# Patient Record
Sex: Female | Born: 1955 | Race: White | Hispanic: No | Marital: Married | Smoking: Former smoker
Health system: Southern US, Community
[De-identification: ages and names within clinical notes are randomized; demographics above are authoritative.]

## PROBLEM LIST (undated history)

## (undated) DIAGNOSIS — C801 Malignant (primary) neoplasm, unspecified: Secondary | ICD-10-CM

## (undated) DIAGNOSIS — E785 Hyperlipidemia, unspecified: Secondary | ICD-10-CM

## (undated) DIAGNOSIS — T464X5A Adverse effect of angiotensin-converting-enzyme inhibitors, initial encounter: Secondary | ICD-10-CM

## (undated) DIAGNOSIS — I340 Nonrheumatic mitral (valve) insufficiency: Secondary | ICD-10-CM

## (undated) DIAGNOSIS — Z889 Allergy status to unspecified drugs, medicaments and biological substances status: Secondary | ICD-10-CM

## (undated) DIAGNOSIS — Z720 Tobacco use: Secondary | ICD-10-CM

## (undated) DIAGNOSIS — R05 Cough: Secondary | ICD-10-CM

## (undated) DIAGNOSIS — I10 Essential (primary) hypertension: Secondary | ICD-10-CM

## (undated) DIAGNOSIS — R058 Other specified cough: Secondary | ICD-10-CM

## (undated) DIAGNOSIS — Z9289 Personal history of other medical treatment: Secondary | ICD-10-CM

## (undated) DIAGNOSIS — IMO0002 Reserved for concepts with insufficient information to code with codable children: Secondary | ICD-10-CM

## (undated) DIAGNOSIS — I2510 Atherosclerotic heart disease of native coronary artery without angina pectoris: Secondary | ICD-10-CM

## (undated) HISTORY — DX: Cough: R05

## (undated) HISTORY — DX: Personal history of other medical treatment: Z92.89

## (undated) HISTORY — DX: Allergy status to unspecified drugs, medicaments and biological substances: Z88.9

## (undated) HISTORY — DX: Nonrheumatic mitral (valve) insufficiency: I34.0

## (undated) HISTORY — DX: Adverse effect of angiotensin-converting-enzyme inhibitors, initial encounter: T46.4X5A

## (undated) HISTORY — PX: EYE SURGERY: SHX253

## (undated) HISTORY — DX: Tobacco use: Z72.0

## (undated) HISTORY — DX: Atherosclerotic heart disease of native coronary artery without angina pectoris: I25.10

## (undated) HISTORY — DX: Essential (primary) hypertension: I10

## (undated) HISTORY — DX: Hyperlipidemia, unspecified: E78.5

## (undated) HISTORY — DX: Other specified cough: R05.8

---

## 2006-10-14 ENCOUNTER — Ambulatory Visit: Payer: Self-pay | Admitting: Internal Medicine

## 2006-10-14 ENCOUNTER — Ambulatory Visit: Payer: Self-pay | Admitting: Cardiology

## 2006-10-14 ENCOUNTER — Inpatient Hospital Stay (HOSPITAL_COMMUNITY): Admission: EM | Admit: 2006-10-14 | Discharge: 2006-10-17 | Payer: Self-pay | Admitting: Internal Medicine

## 2006-10-15 ENCOUNTER — Encounter: Payer: Self-pay | Admitting: Cardiology

## 2006-11-03 ENCOUNTER — Ambulatory Visit: Payer: Self-pay | Admitting: Physician Assistant

## 2008-10-20 DIAGNOSIS — Z9289 Personal history of other medical treatment: Secondary | ICD-10-CM

## 2008-10-20 HISTORY — DX: Personal history of other medical treatment: Z92.89

## 2008-11-07 ENCOUNTER — Encounter: Payer: Self-pay | Admitting: Cardiology

## 2008-11-07 ENCOUNTER — Ambulatory Visit: Payer: Self-pay | Admitting: Cardiology

## 2008-11-09 ENCOUNTER — Encounter: Payer: Self-pay | Admitting: Cardiology

## 2008-11-28 ENCOUNTER — Encounter: Payer: Self-pay | Admitting: Cardiology

## 2008-11-28 ENCOUNTER — Ambulatory Visit: Payer: Self-pay | Admitting: Cardiology

## 2008-11-28 DIAGNOSIS — I10 Essential (primary) hypertension: Secondary | ICD-10-CM | POA: Insufficient documentation

## 2008-11-28 DIAGNOSIS — I2510 Atherosclerotic heart disease of native coronary artery without angina pectoris: Secondary | ICD-10-CM | POA: Insufficient documentation

## 2008-11-28 DIAGNOSIS — F17200 Nicotine dependence, unspecified, uncomplicated: Secondary | ICD-10-CM | POA: Insufficient documentation

## 2008-11-28 DIAGNOSIS — E785 Hyperlipidemia, unspecified: Secondary | ICD-10-CM

## 2008-11-28 DIAGNOSIS — I080 Rheumatic disorders of both mitral and aortic valves: Secondary | ICD-10-CM

## 2008-11-28 DIAGNOSIS — E119 Type 2 diabetes mellitus without complications: Secondary | ICD-10-CM | POA: Insufficient documentation

## 2010-04-22 HISTORY — PX: BILATERAL OOPHORECTOMY: SHX1221

## 2010-09-04 NOTE — Cardiovascular Report (Signed)
NAMEMarland Kitchen  [name], [name]              ACCOUNT NO.:  [id number]   MEDICAL RECORD NO.:  [id number]          PATIENT TYPE:  INP   LOCATION:  3705                         FACILITY:  [facility]   PHYSICIAN:  Veverly Fells. Excell Seltzer, MD  DATE OF BIRTH:  1955-07-14   DATE OF PROCEDURE:  10/15/2006  DATE OF DISCHARGE:                            CARDIAC CATHETERIZATION   PROCEDURE:  Right heart catheterization, left heart catheterization,  selective coronary angiography, left ventricular angiography.   INDICATIONS:  [name] is 55 year old woman who presented to  [facility] with chest pain and shortness of breath.  She was  found to have pathologic Q waves in her inferior leads and significant  mitral regurgitation murmur.  She was subsequently referred for right  and left cardiac catheterization to determine her coronary anatomy and  status of her hemodynamics in the setting of her suspected mitral valve  regurgitation.   Risks and indications of procedure were explained to the patient.  Informed consent was obtained using modified Seldinger technique. A 6-  French sheaths placed in the right femoral artery and a 7-French sheath  was placed in the right femoral vein.  Standard catheters were used to  image the left and right coronary arteries.  An angled pigtail catheter  was inserted into the left ventricle and pressures were recorded.  The  Swan-Ganz catheter was used to record pressures throughout the right  heart chambers from the right atrium to the pulmonary capillary wedge  position.  Oxygen saturations were drawn from a pulmonary artery and  aorta.  Cardiac output was calculated via the Fick method.  An RAO and  LAO ventriculogram was performed.  A pullback across the aortic valve  was done.  All catheter exchanges were performed over a guidewire.  There are no immediate complications.   FINDINGS:  Right atrial pressure 6, right ventricular pressure 33/6,  pulmonary artery pressure  32/14 with mean 22.  Pulmonary capillary wedge  pressures 8, left ventricular pressure is 92/8, aortic pressure is 94/50  with a mean of 69.   Oxygen saturation pulmonary artery 69, aorta is 95.   Fick cardiac outputs 4.1 liters per minute.  Cardiac index is 2.2 liters  per minute per meter squared.   CORONARY ANGIOGRAPHY:  The left mainstem is very short. There are nearly  separate ostia for the LAD and left circumflex.  The LAD is large-  caliber vessel in is proximal portion.  There is an ostial 50% stenosis.  With the 6-French catheter engaged selectively in the LAD there is mild  pressure damping.  The proximal LAD is calcified.  At the origin of the  first diagonal branch there is a 40-50% stenosis in the LAD with  calcification.  The diagonal branch has 70% ostial stenosis.  Further  down in the mid-LAD there is another fifth 40-50% stenosis.  The LAD  courses down and supplies the left ventricular apex.  The septal  perforators of the LAD supply collateral flow to a chronically occluded  right coronary artery that is well collateralized.   The left circumflex is of medium caliber.  There is a medium-size first  OM branch that has a 80% focal stenosis in the bend of the vessel.  The  vessel was small and appears to be no bigger than 2 mm.  There is a  second OM branch that has some nonobstructive plaque.  The mid  circumflex beyond the second OM branch has diffuse 60% stenosis.  There  is a third OM branch and then the AV groove circumflex beyond the third  OM has another 70% stenosis.  This leads into a left posterolateral  branch.   The right coronary artery is subtotally occluded in the proximal portion  and completely occluded in the midportion.  There is TIMI I flow  antegrade in the right coronary artery.   Left ventricular function assessed by 30 degrees RAO and 45 degrees LAO  ventriculography shows akinetic basal to mid inferior wall and otherwise  normal left  ventricular segments with an preserved LVEF of 50%.  There  appears to be 2+ mitral regurgitation present.   ASSESSMENT:  1. Three-vessel coronary artery disease with moderate disease in the      LAD and left circumflex and an occluded right coronary artery that      is collateralized from the left with evidence of prior inferior      myocardial infarction.  2. Moderate mitral regurgitation.  3. Mild left ventricular systolic dysfunction.   PLAN:  I would like to check a 2-D echocardiogram.  Based on the absence  of V-waves in the wedge position.  I think she likely has moderate  mitral regurgitation at worst.  I think echocardiography will better  evaluate her mitral valve disease.  After that will need to make a  decision regarding the revascularization.  I do not think she is an  optimum candidate for PCI based on her small vessels and the diffuse  nature for disease.  Will need to make a decision regarding medical  therapy versus coronary bypass.  Initially, I would favor medical  therapy but I will review this more after the results of echocardiogram.      Veverly Fells. Excell Seltzer, MD  Electronically Signed     MDC/MEDQ  D:  10/15/2006  T:  10/16/2006  Job:  [id number]   cc:   Carmen Codding, MD,FACC

## 2010-09-04 NOTE — Discharge Summary (Signed)
NAMEMarland Kitchen  Carmen, Zuniga              ACCOUNT NO.:  [id number]   MEDICAL RECORD NO.:  [id number]          PATIENT TYPE:  INP   LOCATION:  3705                         FACILITY:  [facility]   PHYSICIAN:  Bettey Mare. Lawrence, NPDATE OF BIRTH:  02/23/1956   DATE OF ADMISSION:  10/14/2006  DATE OF DISCHARGE:  10/17/2006                               DISCHARGE SUMMARY   ADDENDUM:   PRIMARY CARDIOLOGIST:  Learta Codding, MD,FACC   HISTORY:  The patient has requested that she not be placed on lisinopril  because she has noticed each time she is taking it, that she has a  cough, which is very annoying to her and does not allow her to sleep at  night and thus requested a change in medication.  The patient was  discharged on lisinopril 10 mg daily, I have changed that to Avapro 35  mg daily in hopes that an ARB will be less likely to cause her to have a  cough and a prescription has been provided.  Please make these changes  on your medication list when reviewing the original discharge summary.      Bettey Mare. Lyman Bishop, NP     KML/MEDQ  D:  10/17/2006  T:  10/18/2006  Job:  [id number]

## 2010-09-04 NOTE — Assessment & Plan Note (Signed)
[facility]                          [facility] OFFICE NOTE   NAME:[name]                     MRN:          [id number]  DATE:11/03/2006                            DOB:          1955-09-22    CARDIOLOGIST:  Learta Codding, MD,FACC   PRIMARY CARE PHYSICIAN:  Vertis Kelch, P.A.-C, [facility]  [facility]   HISTORY OF PRESENT ILLNESS:  [name] is a 55 year old female  patient who presents to the office today for post-hospitalization  followup. She was initially seen by Dr.  Andee Lineman on June 24 at [facility] to evaluate chest pain. She was also noted to have pathologic  murmur of mitral regurgitation. She was transferred to [facility] where she underwent cardiac catheterization. Cardiac  catheterization revealed 3 vessel coronary artery disease with ostial  LAD stenosis of 50%, proximal 40-50%, diagonal 70% ostial stenosis, mid  LAD 40-50%, 80% focal stenosis in obtuse marginal branch, 60% stenosis  in the circumflex and 70% stenosis beyond the third marginal branch and  an occluded RCA with collaterals from the septal perforators of the LAD.  Her EF was 50% with akinetic basilar mid inferior wall. She had 2+  mitral regurgitation. Echocardiogram revealed moderate mitral  regurgitation and EF of 55-50%. Dr.  Excell Seltzer felt that the patient would  not be an ideal candidate for PCI based upon her small vessels and  diffuse nature of her disease. He favored medical therapy versus  surgical revascularization. Her medical therapy was intensified and she  was eventually discharged to home. She was placed on an ARB secondary to  a significant cough with an ACE inhibitor in the past. She notes to me  today that she is doing well. Denies any recurrent chest pain, shortness  of breath. Denies any syncope or near syncope, orthopnea, paroxysmal  nocturnal dyspnea or pedal edema. Denies any significant palpitations.  She  does however note significant fatigue. She has felt this way since  she went to the hospital. This is not really getting any better. She has  been fairly inactive since discharge from the hospital. She walks to her  mother's house every couple of days and notes extreme fatigue with this.   CURRENT MEDICATIONS:  1. Toprol XL 50 mg a day.  2. Simvastatin 80 mg q nightly.  3. Aspirin 81 mg daily.  4. Imdur 30 mg a day.  5. Metformin 850 mg t.i.d.  6. Avapro 75 mg a day.  7. Nitroglycerine p.r.n. chest pain.   PHYSICAL EXAMINATION:  She is a well-nourished, well-developed female in  no acute distress. Blood pressure is 114/76, pulse 77, weight 172  pounds.  HEENT: Is normal.  NECK: Without JVD.  CARDIAC: Normal S1, S2, with a 2-3/6 systolic ejection murmur heard best  at the apex.  LUNGS:  Are clear to auscultation bilaterally.  ABDOMEN: Soft and nontender.  EXTREMITIES: Without edema. Right femoral arterial site without hematoma  or bruit.   Electrocardiogram reveals sinus rhythm with a heart rate of 75, normal  axis, inferior Q-waves. No acute changes.   IMPRESSION:  1. Coronary artery disease.      a.     Multi-vessel coronary artery disease as outlined above.      b.     Medical therapy.      c.     Preserved left ventricular function.      d.     Moderate mitral regurgitation.  2. Hypertension.  3. Hyperlipidemia.  4. Diabetes mellitus.  5. Tobacco abuse.   PLAN:  The patient returns to the office today for post-hospitalization  followup. Overall, she is doing well without any recurrent angina. She  is quite fatigued. This may be a result of all of the medications that  she is currently taking. She has also been fairly inactive since  discharge from the hospital. At this point in time, I have recommended  she decrease her Imdur to 15 mg a day. If she develops any chest  discomfort, shortness of breath she knows to go back up to a whole  tablet a day. Otherwise, I have  asked the patient to slowly increase her  activity. She should start with at least 5 minutes twice daily walking  and increase slowly to a goal of 30 minutes a day. She is working on  quitting smoking. She has cut down from 2-1/2 packs to a pack a day. I  have congratulated her on this. I have asked her to go ahead and  continue to cut back and to set a quit date. She does have no insurance.  She would like to try to space out her appointments as much possible. I  will set her up for six month followup with Dr.  Andee Lineman and will get an  echocardiogram before that to reassess her mitral regurgitation. Will  also get lipids and LFTs in a couple of weeks. She knows to followup  sooner if she has any worsening or changing symptoms.      Carmen Newcomer, PA-C  Electronically Signed      Learta Codding, MD,FACC  Electronically Signed   SW/MedQ  DD: 11/03/2006  DT: 11/03/2006  Job #: [id number]   cc:   Johnston Ebbs, PA-C

## 2010-09-04 NOTE — Discharge Summary (Signed)
NAMEMarland Zuniga  BLANNIE, SHEDLOCK              ACCOUNT NO.:  [id number]   MEDICAL RECORD NO.:  [id number]          PATIENT TYPE:  INP   LOCATION:  3705                         FACILITY:  [facility]   PHYSICIAN:  Bettey Mare. Lawrence, NPDATE OF BIRTH:  Jan 02, 1956   DATE OF ADMISSION:  10/14/2006  DATE OF DISCHARGE:  10/17/2006                               DISCHARGE SUMMARY   PRIMARY CARE PHYSICIAN:  Selinda Flavin.   PROCEDURES PERFORMED DURING HOSPITALIZATION:  1. Cardiac catheterization dated October 15, 2006 by Dr. Tonny Bollman.      a.     Left main very short, nearly separate ostia for the left       anterior descending and left circumflex.  Left anterior descending       is large caliber.  There is an ostial 50% stenosis with a 6-French       catheter engaged selectively in the left anterior descending.       There is mild pressure dampening.  Proximal left anterior       descending is calcified.  At the origin of the first diagonal,       there is a 50% stenosis in the left anterior descending with       calcification.  The diagonal branch has 70% ostial stenosis.       Further down the mid left anterior descending there is a another       40-50% stenosis.  The left anterior descending courses down and       supplies the left ventricular apex.  The septal perforators of the       left anterior descending supply collateral flow to the chronically       occluded right coronary artery that is well collateralized.      b.     Left circumflex had a median caliber.  The medium sized       first obtuse marginal branch has an 80% focal stenosis in the bend       of the vessel.  The vessel was small and appears to be no bigger       than 2 mm.  There is a second obtuse marginal branch that has some       non obstructive plaque.  The mid circumflex beyond the second       obtuse marginal branch has diffuse 60% stenosis.  There is a third       obtuse marginal branch and then an atrioventricular groove    circumflex beyond the third obtuse marginal that has another 70%       stenosis.  This leads into a left posterolateral branch.  The       right coronary artery is totally occluded in the proximal portion       and completely occluded in the midportion.  There is Thrombolysis       in Myocardial Infarction I flow antegrade in the right coronary       artery.  Left ventricular function reveals an left ventricular       ejection fraction of 50%.  There appears to  be 2+ mitral       regurgitation present.      c.     A 2-D echocardiogram will be checked in the absence of V       waves in the wedge position.  I think she has likely moderate       mitral regurgitation at worst.  Echocardiography would be better       to evaluate her mitral valve disease.  After that we will need to       make a decision regarding revascularization.  I do not think she       is an optimal candidate for percutaneous coronary intervention       based on her small vessels and diffuse nature of disease.  We will       need to make a decision regarding medical therapy versus coronary       artery bypass grafting per Dr. Tonny Bollman.   DISCHARGE DIAGNOSES:  1. Chest pain.  Status post cardiac catheterization revealing three      vessel coronary artery disease.  Please see Dr. Earmon Phoenix thorough      cardiac catheterization dictation for more details.  2. Tobacco abuse.  3. Hypertension.  4. Dyslipidemia.  5. Noninsulin-dependent diabetes.  6. Strong family history of coronary artery disease.   HISTORY OF PRESENT ILLNESS:  This is a 55 year old Caucasian female with  multiple cardiovascular risk factors transferred from [facility] secondary to chest pain with positive CK-MB and negative  troponins x1 with pathological inferior Q waves with borderline criteria  for old posterior infarct.  The patient was seen and evaluated by Dr.  Lewayne Bunting at [facility] and was found to have a high  pretest  probability of coronary artery disease and symptoms suggestive of  angina.  As a result of this, the patient was started on antiplatelet  and antiembolic therapy and also transferred to [facility] for  cardiac catheterization.   The patient was transferred on October 14, 2006 and scheduled for cardiac  catheterization as described above.  The patient tolerated the procedure  well.  There was no evidence of bleeding, hematoma or infection at the  cardiac catheterization site.  The patient continued to have some  ongoing chest discomfort on and off which was relieved with  nitroglycerin.  The patient was also found to have some moderate MR, but  echocardiogram was completed revealing an EF of 55-60%, questionable  atrioseptal aneurysm by echo and stated the MR was moderate.  A followup  echo was suggested within the next six months.  The patient was started  on Toprol and Imdur and was ambulated post-procedure.   The patient was seen and examined on day of discharge by Dr. Jens Som  and found to be stable.  The patient's chest discomfort had been  relieved and there was no associated shortness of breath.  The patient  was given tobacco cessation counseling during her hospitalization as  well.  The patient will follow up with Dr. Andee Lineman in his office in two  weeks and continue current medical management for CAD.  The patient will  need lipids and LFTs drawn in approximately six weeks post discharge and  strong recommendations for tobacco cessation and increased exercise was  strongly recommended.   LABS:  Hemoglobin 12.5, hematocrit 36.1, white blood cells 9.6,  platelets 236, sodium 138, potassium 3.8, chloride 105, CO2 27, glucose  208, BUN 11, creatinine 0.63.  LFTs  were found to be within normal  limits.  AST is 17, ALT 78, albumin was 3.3.  Cardiac enzymes were found to be negative x1 at 0.02, cholesterol 190, triglycerides 129, HDL 35,  LDL 129.   VITAL SIGNS ON  DISCHARGE:  Blood pressure 107/63, pulse 72, respirations  20, temperature 98.4, O2 sat 95% on room air.   DISCHARGE MEDICATIONS:  1. Toprol XL 50 mg 1 p.o. daily.  2. Lisinopril 10 mg daily.  3. Simvastatin 80 mg at bedtime.  4. Nitroglycerin 0.4 mg under tongue p.r.n. chest pain.  5. Imdur 30 mg daily.  6. Metformin 850 mg twice a day.   ALLERGIES:  TO CODEINE AND PENICILLIN.   FOLLOWUP PLANS AND APPOINTMENT:  1. The patient is scheduled to see Dr. Lewayne Bunting on July 14 at 10:30      p.m. in the [place] office.  2. The patient is to have lipids and LFTs checked in six weeks post      date of discharge secondary to institution of simvastatin at day of      discharge.  3. The patient is to be seen by her primary care physician and will      make that appointment for continued medical management.  4. The patient is given post cardiac catheterization instructions with      particular emphasis on the right groin site for evidence of      bleeding, hematoma, infection and to call our office immediately.   TIME SPENT WITH THE PATIENT TO INCLUDE PHYSICIAN TIME:  Was 45 minutes.      Bettey Mare. Lyman Bishop, NP     KML/MEDQ  D:  10/17/2006  T:  10/18/2006  Job:  [id number]   cc:   Selinda Flavin

## 2010-10-25 ENCOUNTER — Encounter: Payer: Self-pay | Admitting: Cardiology

## 2011-02-06 LAB — HEPATIC FUNCTION PANEL
AST: 17
Bilirubin, Direct: <0.1
Total Bilirubin: 0.8

## 2011-02-06 LAB — POCT I-STAT 3, VENOUS BLOOD GAS (G3P V)
Acid-base deficit: 3 — ABNORMAL HIGH
Bicarbonate: 23.8
Operator id: 246211
pH, Ven: 7.304 — ABNORMAL HIGH

## 2011-02-06 LAB — CBC
HCT: 36.1
HCT: 36.5
Hemoglobin: 12.5
Hemoglobin: 12.6
MCHC: 34.6
MCHC: 34.6
MCV: 89
MCV: 89.6
Platelets: 236
Platelets: 240
RBC: 4.1
RDW: 12.7
RDW: 12.9
WBC: 12.2 — ABNORMAL HIGH

## 2011-02-06 LAB — LIPID PANEL
Cholesterol: 214 — ABNORMAL HIGH
HDL: 34 — ABNORMAL LOW
LDL Cholesterol: 129 — ABNORMAL HIGH
LDL Cholesterol: 153 — ABNORMAL HIGH
Total CHOL/HDL Ratio: 6.3
Triglycerides: 134
VLDL: 26
VLDL: 27

## 2011-02-06 LAB — BASIC METABOLIC PANEL
BUN: 11
BUN: 12
CO2: 27
Chloride: 104
Chloride: 105
Creatinine, Ser: 0.75
GFR calc non Af Amer: >60
Glucose, Bld: 200 — ABNORMAL HIGH
Glucose, Bld: 208 — ABNORMAL HIGH
Potassium: 3.8
Potassium: 4.2
Sodium: 138

## 2011-02-06 LAB — POCT I-STAT 3, ART BLOOD GAS (G3+)
Operator id: 246211
pCO2 arterial: 43.2
pH, Arterial: 7.373
pO2, Arterial: 78 — ABNORMAL LOW

## 2011-02-06 LAB — TROPONIN I
Troponin I: 0.02
Troponin I: 0.02

## 2011-02-06 LAB — PROTIME-INR: INR: 1

## 2011-02-06 LAB — CK TOTAL AND CKMB (NOT AT ARMC)
Relative Index: INVALID
Relative Index: INVALID
Total CK: 32

## 2011-02-06 LAB — B-NATRIURETIC PEPTIDE (CONVERTED LAB): Pro B Natriuretic peptide (BNP): 173 — ABNORMAL HIGH

## 2011-02-06 LAB — APTT: aPTT: 36

## 2013-01-22 ENCOUNTER — Other Ambulatory Visit (HOSPITAL_COMMUNITY): Payer: Self-pay | Admitting: Hematology and Oncology

## 2013-01-22 DIAGNOSIS — F17200 Nicotine dependence, unspecified, uncomplicated: Secondary | ICD-10-CM

## 2013-01-22 DIAGNOSIS — R222 Localized swelling, mass and lump, trunk: Secondary | ICD-10-CM

## 2013-01-22 DIAGNOSIS — R918 Other nonspecific abnormal finding of lung field: Secondary | ICD-10-CM

## 2013-01-22 DIAGNOSIS — K769 Liver disease, unspecified: Secondary | ICD-10-CM

## 2013-01-22 DIAGNOSIS — R599 Enlarged lymph nodes, unspecified: Secondary | ICD-10-CM

## 2013-01-22 DIAGNOSIS — K7689 Other specified diseases of liver: Secondary | ICD-10-CM

## 2013-01-22 DIAGNOSIS — R591 Generalized enlarged lymph nodes: Secondary | ICD-10-CM

## 2013-01-25 ENCOUNTER — Encounter: Payer: Self-pay | Admitting: Internal Medicine

## 2013-01-25 ENCOUNTER — Ambulatory Visit (INDEPENDENT_AMBULATORY_CARE_PROVIDER_SITE_OTHER): Payer: Medicaid Other | Admitting: Internal Medicine

## 2013-01-25 ENCOUNTER — Encounter (HOSPITAL_COMMUNITY): Payer: Self-pay

## 2013-01-25 VITALS — BP 104/60 | HR 65 | Temp 98.8°F | Ht 66.0 in | Wt 161.5 lb

## 2013-01-25 DIAGNOSIS — F17200 Nicotine dependence, unspecified, uncomplicated: Secondary | ICD-10-CM

## 2013-01-25 DIAGNOSIS — J449 Chronic obstructive pulmonary disease, unspecified: Secondary | ICD-10-CM

## 2013-01-25 DIAGNOSIS — R918 Other nonspecific abnormal finding of lung field: Secondary | ICD-10-CM

## 2013-01-25 DIAGNOSIS — R222 Localized swelling, mass and lump, trunk: Secondary | ICD-10-CM

## 2013-01-25 NOTE — Assessment & Plan Note (Signed)
Main concern is ongoing smoking, no med rx for now

## 2013-01-25 NOTE — Patient Instructions (Addendum)
Come to [facility] at 715 am tomorrow 10/7 for bronchoscopy via the outpatient registration entrance  Take no Aspirin or macrophage tonight or in am

## 2013-01-25 NOTE — Progress Notes (Signed)
  Subjective:    Patient ID: [name], female    DOB: 24-Aug-1955  MRN: [id number]  HPI   32 yowf active smoking very active since around Labor day with cough clear mucus assoc with sinus pnds  drainage esp when cough > cxr  Admitted with pna 01/12/13 rx with levaquin x 750 x 6 days to [facility] hosp> CT Chest 01/21/13 with lung mass so referred and feeling 100% better at initial ov 01/25/2013   01/25/2013 1st [facility] office visit/ Lucero Ide cc slt sensation of pnds now watery again p rx with levaquin and feeling otherwise "back to nl" - no cp, sob, no cough or h/o hemoptysis or need for any pulmonary medications  No obvious day to day or daytime variabilty or assoc   chest tightness, subjective wheeze overt sinus or hb symptoms. No unusual exp hx or h/o childhood pna/ asthma or knowledge of premature birth.  Sleeping ok without nocturnal  or early am exacerbation  of respiratory  c/o's or need for noct saba. Also denies any obvious fluctuation of symptoms with weather or environmental changes or other aggravating or alleviating factors except as outlined above   Current Medications, Allergies, Complete Past Medical History, Past Surgical History, Family History, and Social History were reviewed in [employer] record.  ROS  The following are not active complaints unless bolded sore throat, dysphagia, dental problems, itching, sneezing,  nasal congestion or excess/ purulent secretions, ear ache,   fever, chills, sweats, unintended wt loss, pleuritic or exertional cp, hemoptysis,  orthopnea pnd or leg swelling, presyncope, palpitations, heartburn, abdominal pain, anorexia, nausea, vomiting, diarrhea  or change in bowel or urinary habits, change in stools or urine, dysuria,hematuria,  rash, arthralgias, visual complaints, headache, numbness weakness or ataxia or problems with walking or coordination,  change in mood/affect or memory.       Review of Systems   Constitutional: Negative for fever, chills and unexpected weight change.  HENT: Positive for sore throat, rhinorrhea and postnasal drip. Negative for ear pain, nosebleeds, congestion, sneezing, trouble swallowing, dental problem, voice change and sinus pressure.   Eyes: Negative for visual disturbance.  Respiratory: Positive for cough. Negative for choking and shortness of breath.   Cardiovascular: Negative for chest pain and leg swelling.  Gastrointestinal: Negative for vomiting, abdominal pain and diarrhea.  Genitourinary: Negative for difficulty urinating.  Musculoskeletal: Negative for arthralgias.  Skin: Negative for rash.  Neurological: Negative for tremors, syncope and headaches.  Hematological: Does not bruise/bleed easily.       Objective:   Physical Exam   amb wf nad  Wt Readings from Last 3 Encounters:  01/25/13 161 lb 8 oz (73.256 kg)  11/28/08 168 lb (76.204 kg)      HEENT mild turbinate edema.  Oropharynx no thrush or excess pnd or cobblestoning.  No JVD or cervical adenopathy. Mild accessory muscle hypertrophy. Trachea midline, nl thryroid. Chest was hyperinflated by percussion with diminished breath sounds esp RUL and moderate increased exp time without wheeze. Hoover sign positive at mid inspiration. Regular rate and rhythm without murmur gallop or rub or increase P2 or edema.  Abd: no hsm, nl excursion. Ext warm without cyanosis or clubbing.    CT 01/12/13  with RUL ATx      Assessment & Plan:

## 2013-01-25 NOTE — Assessment & Plan Note (Signed)
Strongly advised to quid

## 2013-01-25 NOTE — Assessment & Plan Note (Signed)
Discussed in detail all the  indications, usual  risks and alternatives  relative to the benefits with patient who agrees to proceed with bronchoscopy with biopsy.  

## 2013-01-26 ENCOUNTER — Telehealth: Payer: Self-pay | Admitting: Internal Medicine

## 2013-01-26 ENCOUNTER — Encounter (HOSPITAL_COMMUNITY)
Admission: RE | Disposition: A | Discharge status: Home / Self Care | Payer: Medicaid Other | Source: Ambulatory Visit | Attending: Internal Medicine

## 2013-01-26 ENCOUNTER — Ambulatory Visit (HOSPITAL_COMMUNITY)
Admission: RE | Admit: 2013-01-26 | Discharge: 2013-01-26 | Disposition: A | Discharge status: Home / Self Care | Payer: Medicaid Other | Source: Ambulatory Visit | Attending: Internal Medicine | Admitting: Internal Medicine

## 2013-01-26 DIAGNOSIS — R918 Other nonspecific abnormal finding of lung field: Secondary | ICD-10-CM

## 2013-01-26 DIAGNOSIS — C7A1 Malignant poorly differentiated neuroendocrine tumors: Secondary | ICD-10-CM | POA: Insufficient documentation

## 2013-01-26 DIAGNOSIS — R222 Localized swelling, mass and lump, trunk: Secondary | ICD-10-CM | POA: Insufficient documentation

## 2013-01-26 DIAGNOSIS — F17200 Nicotine dependence, unspecified, uncomplicated: Secondary | ICD-10-CM | POA: Insufficient documentation

## 2013-01-26 HISTORY — PX: VIDEO BRONCHOSCOPY: SHX5072

## 2013-01-26 SURGERY — VIDEO BRONCHOSCOPY WITHOUT FLUORO
Anesthesia: Moderate Sedation | Laterality: Bilateral

## 2013-01-26 MED ORDER — EPINEPHRINE HCL 0.1 MG/ML IJ SOSY
PREFILLED_SYRINGE | INTRAMUSCULAR | Status: DC | PRN
  Administered 2013-01-26: 1 via ENDOTRACHEOPULMONARY

## 2013-01-26 MED ORDER — MIDAZOLAM HCL 10 MG/2ML IJ SOLN
INTRAMUSCULAR | Status: AC
  Filled 2013-01-26: qty 4

## 2013-01-26 MED ORDER — MEPERIDINE HCL 25 MG/ML IJ SOLN
INTRAMUSCULAR | Status: DC | PRN
  Administered 2013-01-26: 50 mg via INTRAVENOUS

## 2013-01-26 MED ORDER — MIDAZOLAM HCL 10 MG/2ML IJ SOLN: 1.0000 mg | Freq: Once | INTRAMUSCULAR | Status: DC

## 2013-01-26 MED ORDER — MIDAZOLAM HCL 10 MG/2ML IJ SOLN
INTRAMUSCULAR | Status: DC | PRN
  Administered 2013-01-26 (×2): 2.5 mg via INTRAVENOUS

## 2013-01-26 MED ORDER — LIDOCAINE HCL 1 % IJ SOLN
INTRAMUSCULAR | Status: DC | PRN
  Administered 2013-01-26: 6 mL via RESPIRATORY_TRACT

## 2013-01-26 MED ORDER — MEPERIDINE HCL 100 MG/ML IJ SOLN
INTRAMUSCULAR | Status: AC
  Filled 2013-01-26: qty 2

## 2013-01-26 MED ORDER — SODIUM CHLORIDE 0.9 % IV SOLN
INTRAVENOUS | Status: DC
  Administered 2013-01-26: 08:00:00 via INTRAVENOUS

## 2013-01-26 MED ORDER — PHENYLEPHRINE HCL 0.25 % NA SOLN
NASAL | Status: DC | PRN
  Administered 2013-01-26: 2 via NASAL

## 2013-01-26 MED ORDER — LIDOCAINE HCL 2 % EX GEL
CUTANEOUS | Status: DC | PRN
  Administered 2013-01-26: 1

## 2013-01-26 MED ORDER — MEPERIDINE HCL 100 MG/ML IJ SOLN: 100.0000 mg | Freq: Once | INTRAMUSCULAR | Status: DC

## 2013-01-26 NOTE — Telephone Encounter (Signed)
i spoke with Susie and made her aware. Nothing further needed

## 2013-01-26 NOTE — Op Note (Signed)
Bronchoscopy Procedure Note  Date of Operation:  01/26/2013   Pre-op Diagnosis: lung mass  Post-op Diagnosis: same  Surgeon: Sandrea Hughs  Anesthesia: Monitored Local Anesthesia with Sedation  Operation: Video Flexible fiberoptic bronchoscopy, diagnostic   Findings: fungating masses RUL and RML orifice with cobblestoning of Bronchus intermedius also  Specimen: Bronchial Washings, RML orifice  Estimated Blood Loss: min  Complications: none  Indications and History: See updated H and P same date. The risks, benefits, complications, treatment options and expected outcomes were discussed with the patient.  The possibilities of reaction to medication, pulmonary aspiration, perforation of a viscus, bleeding, failure to diagnose a condition and creating a complication requiring transfusion or operation were discussed with the patient who freely signed the consent.    Description of Procedure: The patient was re-examined in the bronchoscopy suite and the site of surgery properly noted/marked.  The patient was identified  and the procedure verified as Flexible Fiberoptic Bronchoscopy.  A Time Out was held and the above information confirmed.   After the induction of topical nasopharyngeal anesthesia, the patient was positioned  and the bronchoscope was passed through the R naris. The vocal cords were visualized and  1% buffered lidocaine 5 ml was topically placed onto the cords. The cords were nl and moved normally on phonation. The scope was then passed into the trachea.  1% buffered lidocaine given topically. Airways inspected bilaterally to the subsegmental level with the following findings:   Trachea carina and L airways nl R UL, BI and R ML orifice involved with heaped up friable mucosa with >75% obst of RML/ RUL at orifice  Interventions RML washings Endobronchial bx x 6 RML orifice    The Patient was taken to the Endoscopy Recovery area in satisfactory condition.  Attestation: I  performed the procedure.  Sandrea Hughs, MD Pulmonary and Critical Care Medicine [facility] Cell [phone] After 5:30 PM or weekends, call [phone]

## 2013-01-26 NOTE — Progress Notes (Signed)
Video bronchoscopy performed Intervention bronchial washing Intervention bronchial biopsy  Jacqulynn Cadet RRT

## 2013-01-26 NOTE — H&P (Signed)
HPI     37 yowf active smoking very active since around Labor day with cough clear mucus assoc with sinus pnds  drainage esp when cough > cxr  Admitted with pna 01/12/13 rx with levaquin x 750 x 6 days to [facility] hosp> CT Chest 01/21/13 with lung mass so referred and feeling 100% better at initial ov 01/25/2013     01/25/2013 1st  Pulmonary office visit/ Murl Golladay cc slt sensation of pnds now watery again p rx with levaquin and feeling otherwise "back to nl" - no cp, sob, no cough or h/o hemoptysis or need for any pulmonary medications   No obvious day to day or daytime variabilty or assoc   chest tightness, subjective wheeze overt sinus or hb symptoms. No unusual exp hx or h/o childhood pna/ asthma or knowledge of premature birth.   Sleeping ok without nocturnal  or early am exacerbation  of respiratory  c/o's or need for noct saba. Also denies any obvious fluctuation of symptoms with weather or environmental changes or other aggravating or alleviating factors except as outlined above    Current Medications, Allergies, Complete Past Medical History, Past Surgical History, Family History, and Social History were reviewed in [employer] record.   ROS  The following are not active complaints unless bolded sore throat, dysphagia, dental problems, itching, sneezing,  nasal congestion or excess/ purulent secretions, ear ache,   fever, chills, sweats, unintended wt loss, pleuritic or exertional cp, hemoptysis,  orthopnea pnd or leg swelling, presyncope, palpitations, heartburn, abdominal pain, anorexia, nausea, vomiting, diarrhea  or change in bowel or urinary habits, change in stools or urine, dysuria,hematuria,  rash, arthralgias, visual complaints, headache, numbness weakness or ataxia or problems with walking or coordination,  change in mood/affect or memory.        Review of Systems  Constitutional: Negative for fever, chills and unexpected weight change.  HENT:  Positive for sore throat, rhinorrhea and postnasal drip. Negative for ear pain, nosebleeds, congestion, sneezing, trouble swallowing, dental problem, voice change and sinus pressure.   Eyes: Negative for visual disturbance.  Respiratory: Positive for cough. Negative for choking and shortness of breath.   Cardiovascular: Negative for chest pain and leg swelling.  Gastrointestinal: Negative for vomiting, abdominal pain and diarrhea.  Genitourinary: Negative for difficulty urinating.  Musculoskeletal: Negative for arthralgias.  Skin: Negative for rash.  Neurological: Negative for tremors, syncope and headaches.  Hematological: Does not bruise/bleed easily.          Objective:     Physical Exam     amb wf nad    Wt Readings from Last 3 Encounters:   01/25/13  161 lb 8 oz (73.256 kg)   11/28/08  168 lb (76.204 kg)         HEENT mild turbinate edema.  Oropharynx no thrush or excess pnd or cobblestoning.  No JVD or cervical adenopathy. Mild accessory muscle hypertrophy. Trachea midline, nl thryroid. Chest was hyperinflated by percussion with diminished breath sounds esp RUL and moderate increased exp time without wheeze. Hoover sign positive at mid inspiration. Regular rate and rhythm without murmur gallop or rub or increase P2 or edema.  Abd: no hsm, nl excursion. Ext warm without cyanosis or clubbing.      CT 01/12/13  with RUL ATx        Assessment & Plan:     Lung mass - Nyoka Cowden, MD           Discussed in detail all  the  indications, usual  risks and alternatives  relative to the benefits with patient who agrees to proceed with bronchoscopy with biopsy.       01/26/2013 day of FOB, no change in hx or exam.     Sandrea Hughs, MD Pulmonary and Critical Care Medicine [facility] Cell [phone] After 5:30 PM or weekends, call [phone]

## 2013-01-26 NOTE — Telephone Encounter (Signed)
Yes, all set for now

## 2013-01-26 NOTE — Telephone Encounter (Signed)
Dr. Sherene Sires please advise if you were able to get what was needed on pt? thanks

## 2013-01-27 ENCOUNTER — Encounter (HOSPITAL_COMMUNITY): Payer: Self-pay | Admitting: Internal Medicine

## 2013-02-01 ENCOUNTER — Telehealth: Payer: Self-pay | Admitting: *Deleted

## 2013-02-01 ENCOUNTER — Other Ambulatory Visit: Payer: Self-pay | Admitting: Internal Medicine

## 2013-02-01 ENCOUNTER — Telehealth: Payer: Self-pay | Admitting: Internal Medicine

## 2013-02-01 DIAGNOSIS — R918 Other nonspecific abnormal finding of lung field: Secondary | ICD-10-CM

## 2013-02-01 NOTE — Telephone Encounter (Signed)
i have faxed report over to cancer center. Nothing further needed

## 2013-02-01 NOTE — Telephone Encounter (Signed)
Discussed with pt

## 2013-02-01 NOTE — Telephone Encounter (Signed)
Dr. Sherene Sires please advise if you tried calling patient? thanks

## 2013-02-01 NOTE — Telephone Encounter (Signed)
Called spoke with pt regarding appt on 02/02/13 at 3:30 with Dr. Arbutus Ped.  She verbalized understanding of time and place of appt

## 2013-02-02 ENCOUNTER — Other Ambulatory Visit: Payer: Self-pay | Admitting: Internal Medicine

## 2013-02-02 ENCOUNTER — Other Ambulatory Visit: Payer: Medicaid Other

## 2013-02-02 ENCOUNTER — Encounter: Payer: Self-pay | Admitting: Internal Medicine

## 2013-02-02 ENCOUNTER — Telehealth: Payer: Self-pay | Admitting: Internal Medicine

## 2013-02-02 ENCOUNTER — Encounter: Payer: Self-pay | Admitting: *Deleted

## 2013-02-02 ENCOUNTER — Ambulatory Visit (HOSPITAL_BASED_OUTPATIENT_CLINIC_OR_DEPARTMENT_OTHER): Payer: Medicaid Other | Admitting: Internal Medicine

## 2013-02-02 VITALS — BP 133/68 | HR 90 | Temp 98.3°F | Resp 18 | Ht 66.0 in | Wt 159.5 lb

## 2013-02-02 DIAGNOSIS — F17200 Nicotine dependence, unspecified, uncomplicated: Secondary | ICD-10-CM

## 2013-02-02 DIAGNOSIS — E119 Type 2 diabetes mellitus without complications: Secondary | ICD-10-CM

## 2013-02-02 DIAGNOSIS — C3491 Malignant neoplasm of unspecified part of right bronchus or lung: Secondary | ICD-10-CM

## 2013-02-02 DIAGNOSIS — C3490 Malignant neoplasm of unspecified part of unspecified bronchus or lung: Secondary | ICD-10-CM | POA: Insufficient documentation

## 2013-02-02 DIAGNOSIS — R918 Other nonspecific abnormal finding of lung field: Secondary | ICD-10-CM

## 2013-02-02 DIAGNOSIS — C7A1 Malignant poorly differentiated neuroendocrine tumors: Secondary | ICD-10-CM

## 2013-02-02 MED ORDER — PROCHLORPERAZINE MALEATE 10 MG PO TABS: 10.0000 mg | ORAL_TABLET | Freq: Four times a day (QID) | ORAL | Status: DC | PRN

## 2013-02-02 NOTE — Patient Instructions (Signed)
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 We discussed treatment options including systemic chemotherapy with cisplatin and etoposide. First cycle expected early next week. Followup visit in 2 weeks. Referral to radiation oncology in [place].

## 2013-02-02 NOTE — Progress Notes (Signed)
[facility] Telephone:([phone]) [phone]   Fax:([phone]) [phone]  CONSULT NOTE  REFERRING PHYSICIAN: Dr. Sandrea Hughs  REASON FOR CONSULTATION:  57 years old white female recently diagnosed with lung cancer  HPI [name] is a 57 y.o. 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 The patient mentions that over the last few weeks he has been complaining of increasing cough and runny nose as well as shortness of breath. She was seen at the emergency Department at [facility] and was treated for pneumonia with a course of antibiotic but she was getting worse and her antibiotics were discontinued.  She had several imaging studies performed at that time including chest x-ray that showed a right lung mass this was followed by CT scan of the chest which showed central right middle and right upper lobe abnormality. Unfortunately the CT scan results and imaging are not available for me at this point. The patient was referred to Dr. Sherene Sires and on 01/26/2013 she underwent a video flexible fiberoptic bronchoscopy which showed fungating masses in the right upper lobe and right middle lobe orifices with cobblestoning of the bronchus intermedius. Bronchial washing and endobronchial biopsiesof the right middle lobe orifice were performed.  The final pathology (Accession: [id number]) showed poorly differentiated neuroendocrine carcinoma. The biopsies are composed of malignant cells with significant crush artifact, apoptotic bodies and tumor necrosis. Immunohistochemical stains were performed and the tumor cells are strongly positive for cytokeratin AE1/AE3, chromogranin, synaptophysin and CD56. The tumor cells are negative for Napsin A, TTF-1, CK5/6 and p63 with appropriate controls. The overall findings are diagnostic for poorly differentiated  neuroendocrine carcinoma, favor small cell carcinoma. The patient had several imaging studies for staging workup performed in at [facility] [place] including CT scan of the abdomen and pelvis as well as bone scan and MRI of the brain that were negative for metastatic disease. She is scheduled to have a PET scan on 02/05/2013 at [facility]. Dr. Sherene Sires kindly referred the patient to me today for further evaluation and recommendation regarding treatment of her condition. The patient lives in [place] but she would like to receive her treatment in [place]. She is feeling fine today except for nonproductive cough with shortness breath on exertion but no significant chest pain or hemoptysis. The patient denied having any significant weight loss or night sweats. She has no headache or visual changes. She denied having any significant nausea, vomiting or change in bowel movement. Family history significant for a mother who still alive and had several strokes and father who died from asphyxia.  The patient is married and has 2 children a son and daughter. She was accompanied by her husband [name]. 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 She has a history of smoking 1-1/2 pack per day for around 42 years and just quit few days ago. She has no history of alcohol or drug abuse. HPI  Past Medical History  Diagnosis Date  . Tobacco abuse   . Diabetes mellitus   . Mitral regurgitation   . Hypertension   . Dyslipidemia   . Coronary artery disease   . History of echocardiogram 7/10    EF 50-55%; inferior hypokinesis; RV function mild decreased  . ACE-inhibitor cough   . Multiple allergies     Penicillin and mushrooms    Past Surgical History  Procedure  Laterality Date  . Video bronchoscopy Bilateral 01/26/2013    Procedure: VIDEO BRONCHOSCOPY WITHOUT FLUORO;  Surgeon: Nyoka Cowden, MD;  Location: [facility];  Service: Cardiopulmonary;   Laterality: Bilateral;    Family History  Problem Relation Age of Onset  . Stroke Mother   . Coronary artery disease Father     Social History History  Substance Use Topics  . Smoking status: Former Smoker -- 1.50 packs/day for 40 years    Types: Cigarettes    Quit date: 01/30/2013  . Smokeless tobacco: Never Used  . Alcohol Use: No    Allergies  Allergen Reactions  . Codeine   . Penicillins     Current Outpatient Prescriptions  Medication Sig Dispense Refill  . aspirin 81 MG tablet Take 81 mg by mouth daily.      . B Complex-C (SUPER B COMPLEX PO) Take 1 tablet by mouth daily.      . isosorbide mononitrate (IMDUR) 30 MG 24 hr tablet Take 30 mg by mouth daily.        Marland Kitchen loratadine (CLARITIN) 10 MG tablet Take 10 mg by mouth daily.      Marland Kitchen lovastatin (MEVACOR) 20 MG tablet Take 20 mg by mouth at bedtime.      . metFORMIN (GLUCOPHAGE) 850 MG tablet Take 850 mg by mouth 2 (two) times daily with a meal.      . metoprolol (TOPROL-XL) 50 MG 24 hr tablet Take 50 mg by mouth daily.        . Misc Natural Products (ENERGY FOCUS) TABS Take by mouth.      . valsartan (DIOVAN) 160 MG tablet Take 160 mg by mouth daily.        . prochlorperazine (COMPAZINE) 10 MG tablet Take 1 tablet (10 mg total) by mouth every 6 (six) hours as needed.  60 tablet  0   No current facility-administered medications for this visit.    Review of Systems  Constitutional: negative Eyes: negative Ears, nose, mouth, throat, and face: negative Respiratory: positive for cough and dyspnea on exertion Cardiovascular: negative Gastrointestinal: negative Genitourinary:negative Integument/breast: negative Hematologic/lymphatic: negative Musculoskeletal:negative Neurological: negative Behavioral/Psych: negative Endocrine: negative Allergic/Immunologic: negative  Physical Exam  ZOX:WRUEA, healthy, no distress, well nourished and well developed SKIN: skin color, texture, turgor are normal, no rashes or  significant lesions HEAD: Normocephalic, No masses, lesions, tenderness or abnormalities EYES: normal, PERRLA EARS: External ears normal OROPHARYNX:no exudate, no erythema and lips, buccal mucosa, and tongue normal  NECK: supple, no adenopathy, no JVD LYMPH:  no palpable lymphadenopathy, no hepatosplenomegaly BREAST:not examined LUNGS: clear to auscultation , and palpation HEART: regular rate & rhythm, no murmurs and no gallops ABDOMEN:abdomen soft, non-tender, normal bowel sounds and no masses or organomegaly BACK: Back symmetric, no curvature., No CVA tenderness EXTREMITIES:no joint deformities, effusion, or inflammation, no edema, no skin discoloration  NEURO: alert & oriented x 3 with fluent speech, no focal motor/sensory deficits  PERFORMANCE STATUS: ECOG 1  LABORATORY DATA: Lab Results  Component Value Date   WBC 9.6 10/16/2006   HGB 12.5 10/16/2006   HCT 36.1 10/16/2006   MCV 89.6 10/16/2006   PLT 236 10/16/2006      Chemistry      Component Value Date/Time   NA 138 10/16/2006 0420   K 3.8 10/16/2006 0420   CL 105 10/16/2006 0420   CO2 27 10/16/2006 0420   BUN 11 10/16/2006 0420   CREATININE 0.63 10/16/2006 0420      Component Value Date/Time  CALCIUM 9.2 10/16/2006 0420   ALKPHOS 64 10/16/2006 0420   AST 17 10/16/2006 0420   ALT 18 10/16/2006 0420   BILITOT 0.8 10/16/2006 0420       RADIOGRAPHIC STUDIES: No results found.  ASSESSMENT: this is a very pleasant 57 years old white female recently diagnosed with small cell lung cancer most likely limited stage disease based on the abnormalities of the CT scan of the chest but a PET scan is still pending   PLAN: I had a lengthy discussion with the patient and her husband today about her current disease stage, prognosis and treatment options. I agree with proceeding with the PET scan to complete the staging workup for this patient and to rule out any metastatic disease. I will obtain her records from [facility]  including MRI of the brain as well as CT scan of the abdomen pelvis and bone scan. I had a lengthy discussion with the patient about her treatment options. I recommended for the patient treatment with a course of systemic chemotherapy with cisplatin 60 mg/M2 on day 1 and etoposide 120 mg/M2 on days 1, 2 and 3 with Neulasta support on day 4 for a total of 4-6 cycles depending on her response.  The chemotherapy would be concurrent with radiotherapy to the disease in the chest if she has no evidence for disease progression outside the right lung. This would be followed by prophylactic cranial irradiation as the patient has good response to the chemotherapy I discussed with the patient adverse effect of the chemotherapy including but not limited to alopecia, myelosuppression, nausea and vomiting, peripheral neuropathy, liver or in dysfunction as well as hearing deficit. The patient would like to proceed with the chemotherapy as soon as possible. She is very interested in receiving her chemotherapy in [place] but may consider radiation therapy in [place] close to home. I will refer the patient to the radiation oncologist in [facility] for consideration of the concurrent radiotherapy. She is expected to start the first cycle of this treatment early next week. The patient will receive a chemotherapy education class later today.  I will call her pharmacy with prescription for Compazine 10 mg by mouth every 6 hours as needed for nausea. The patient would come back for follow up visit in 2 weeks for evaluation and management any adverse effect of her chemotherapy. She was advised to call immediately if she has any concerning symptoms in the interval.  The patient voices understanding of current disease status and treatment options and is in agreement with the current care plan.  All questions were answered. The patient knows to call the clinic with any problems, questions or concerns. We can certainly see the  patient much sooner if necessary.  Thank you so much for allowing me to participate in the care of [name]. I will continue to follow up the patient with you and assist in her care.  I spent 60 minutes counseling the patient face to face. The total time spent in the appointment was 80 minutes.  Venba Zenner K. 02/02/2013, 9:24 PM

## 2013-02-02 NOTE — Telephone Encounter (Signed)
There are not any records scanned into the pt chart. Advised stephanie. Carron Curie, CMA

## 2013-02-03 ENCOUNTER — Encounter: Payer: Self-pay | Admitting: *Deleted

## 2013-02-03 ENCOUNTER — Telehealth: Payer: Self-pay | Admitting: Medical Oncology

## 2013-02-03 NOTE — Telephone Encounter (Signed)
Dr Thurston Hole office does not have [facility] records.

## 2013-02-04 ENCOUNTER — Telehealth: Payer: Self-pay | Admitting: *Deleted

## 2013-02-04 ENCOUNTER — Telehealth: Payer: Self-pay | Admitting: Internal Medicine

## 2013-02-04 ENCOUNTER — Encounter: Payer: Self-pay | Admitting: *Deleted

## 2013-02-04 NOTE — Telephone Encounter (Signed)
Pt called re appts to start Monday. appts just complete. Pt tx moved to start 10/22 thru 10/25 due to books closed 10/20 and 10/21. gv pt appts for 10/20 lb only and 10/22, 10/23, 10/24 for tx w/inj 10/25. Pt also given lb/fu 10/28. Pt has appt d/t's and will get schedule when she comes in. Pt also aware when she sees AJ 10/28 we will see if we start next cycle the week of 11/10 on Monday or leave on Wednesdays.

## 2013-02-04 NOTE — Progress Notes (Signed)
[facility] Psychosocial Distress Screening Clinical Social Work  Clinical Social Work was referred by distress screening protocol.  The patient scored a 5 on the Psychosocial Distress Thermometer which indicates moderate distress. Clinical Social Worker phoned to Pt assess for distress and other psychosocial needs. Pt coping well, but unaware of resources to assist. CSW spent time discussing resources to assist and providing supportive listening. Pt agrees to contact us as needed. She was appreciative of the phone call.   Clinical Social Worker follow up needed: no  Doreen Salvage, LCSW Clinical Social Worker [facility] Wednesday, Thursday and Friday Phone: [phone] Fax: [phone]

## 2013-02-04 NOTE — Telephone Encounter (Signed)
Per POF I have tried to schedule appts, but no openings next week for treatment. Scheduler notified.

## 2013-02-04 NOTE — Telephone Encounter (Signed)
Per staff message and POF I have scheduled appts.  JMW  

## 2013-02-05 ENCOUNTER — Encounter (HOSPITAL_COMMUNITY)
Admission: RE | Admit: 2013-02-05 | Discharge: 2013-02-05 | Disposition: A | Discharge status: Home / Self Care | Payer: Medicaid Other | Source: Ambulatory Visit | Attending: Hematology and Oncology | Admitting: Hematology and Oncology

## 2013-02-05 DIAGNOSIS — R591 Generalized enlarged lymph nodes: Secondary | ICD-10-CM

## 2013-02-05 DIAGNOSIS — C7A1 Malignant poorly differentiated neuroendocrine tumors: Secondary | ICD-10-CM | POA: Insufficient documentation

## 2013-02-05 DIAGNOSIS — R599 Enlarged lymph nodes, unspecified: Secondary | ICD-10-CM | POA: Insufficient documentation

## 2013-02-05 DIAGNOSIS — J90 Pleural effusion, not elsewhere classified: Secondary | ICD-10-CM | POA: Insufficient documentation

## 2013-02-05 DIAGNOSIS — J438 Other emphysema: Secondary | ICD-10-CM | POA: Insufficient documentation

## 2013-02-05 DIAGNOSIS — R918 Other nonspecific abnormal finding of lung field: Secondary | ICD-10-CM

## 2013-02-05 DIAGNOSIS — K769 Liver disease, unspecified: Secondary | ICD-10-CM

## 2013-02-05 MED ORDER — FLUDEOXYGLUCOSE F - 18 (FDG) INJECTION
19.9000 | Freq: Once | INTRAVENOUS | Status: AC | PRN
  Administered 2013-02-05: 19.9 via INTRAVENOUS

## 2013-02-08 ENCOUNTER — Other Ambulatory Visit (HOSPITAL_BASED_OUTPATIENT_CLINIC_OR_DEPARTMENT_OTHER): Payer: Medicaid Other | Admitting: Lab

## 2013-02-08 ENCOUNTER — Encounter: Payer: Self-pay | Admitting: Internal Medicine

## 2013-02-08 ENCOUNTER — Telehealth: Payer: Self-pay | Admitting: *Deleted

## 2013-02-08 ENCOUNTER — Other Ambulatory Visit: Payer: Self-pay | Admitting: *Deleted

## 2013-02-08 DIAGNOSIS — C3490 Malignant neoplasm of unspecified part of unspecified bronchus or lung: Secondary | ICD-10-CM

## 2013-02-08 DIAGNOSIS — C3491 Malignant neoplasm of unspecified part of right bronchus or lung: Secondary | ICD-10-CM

## 2013-02-08 DIAGNOSIS — C801 Malignant (primary) neoplasm, unspecified: Secondary | ICD-10-CM

## 2013-02-08 LAB — CBC WITH DIFFERENTIAL/PLATELET
BASO%: 0.6 % (ref 0.0–2.0)
EOS%: 4.5 % (ref 0.0–7.0)
Eosinophils Absolute: 0.5 10*3/uL (ref 0.0–0.5)
LYMPH%: 16.6 % (ref 14.0–49.7)
MCH: 30.5 pg (ref 25.1–34.0)
MCHC: 33.7 g/dL (ref 31.5–36.0)
MCV: 90.5 fL (ref 79.5–101.0)
MONO#: 0.9 10*3/uL (ref 0.1–0.9)
MONO%: 7.8 % (ref 0.0–14.0)
NEUT#: 7.6 10*3/uL — ABNORMAL HIGH (ref 1.5–6.5)
NEUT%: 70.5 % (ref 38.4–76.8)
Platelets: 261 10*3/uL (ref 145–400)
RBC: 4.05 10*6/uL (ref 3.70–5.45)
RDW: 12.9 % (ref 11.2–14.5)
WBC: 10.8 10*3/uL — ABNORMAL HIGH (ref 3.9–10.3)

## 2013-02-08 LAB — COMPREHENSIVE METABOLIC PANEL (CC13)
ALT: 9 U/L (ref 0–55)
AST: 16 U/L (ref 5–34)
Albumin: 3.2 g/dL — ABNORMAL LOW (ref 3.5–5.0)
Alkaline Phosphatase: 58 U/L (ref 40–150)
Anion Gap: 11 mEq/L (ref 3–11)
Creatinine: 0.8 mg/dL (ref 0.6–1.1)
Potassium: 4.7 mEq/L (ref 3.5–5.1)
Sodium: 140 mEq/L (ref 136–145)
Total Bilirubin: 0.46 mg/dL (ref 0.20–1.20)
Total Protein: 7.4 g/dL (ref 6.4–8.3)

## 2013-02-08 LAB — MAGNESIUM (CC13): Magnesium: 1.4 mg/dl — CL (ref 1.5–2.5)

## 2013-02-08 MED ORDER — MAGNESIUM OXIDE 400 (241.3 MG) MG PO TABS: 400.0000 mg | ORAL_TABLET | Freq: Three times a day (TID) | ORAL | Status: DC

## 2013-02-08 NOTE — Progress Notes (Signed)
Magnesium level 1.4, per Dr Donnald Garre, pt needs mag oxide 400mg  TID.  Pt verbalized understanding.  SLJ

## 2013-02-08 NOTE — Telephone Encounter (Signed)
[name] called reporting patient is there reporting she is to begin radiation therapy in [place].  Deny receipt of referral.  Printed referral order, labs, scans and demographics to [phone].

## 2013-02-08 NOTE — Progress Notes (Signed)
Checked in patient for lab. She doesn't have a PCP-she was referred by [facility] hosp she was admitted for pnemonia. She is working on [employer]. She has spoke with Mrs. Sullivan Lone in [place] and D. Page in [place]. She knows of self pay until she gets. I gave her billing ph# for bills, because she has a cancer policy also.

## 2013-02-10 ENCOUNTER — Ambulatory Visit (HOSPITAL_BASED_OUTPATIENT_CLINIC_OR_DEPARTMENT_OTHER): Payer: Medicaid Other

## 2013-02-10 VITALS — BP 102/55 | HR 91 | Temp 98.0°F

## 2013-02-10 DIAGNOSIS — C7A1 Malignant poorly differentiated neuroendocrine tumors: Secondary | ICD-10-CM

## 2013-02-10 DIAGNOSIS — C3491 Malignant neoplasm of unspecified part of right bronchus or lung: Secondary | ICD-10-CM

## 2013-02-10 DIAGNOSIS — Z5111 Encounter for antineoplastic chemotherapy: Secondary | ICD-10-CM

## 2013-02-10 MED ORDER — SODIUM CHLORIDE 0.9 % IV SOLN
60.0000 mg/m2 | Freq: Once | INTRAVENOUS | Status: AC
  Administered 2013-02-10: 110 mg via INTRAVENOUS
  Filled 2013-02-10: qty 110

## 2013-02-10 MED ORDER — PALONOSETRON HCL INJECTION 0.25 MG/5ML
INTRAVENOUS | Status: AC
  Filled 2013-02-10: qty 5

## 2013-02-10 MED ORDER — DEXAMETHASONE SODIUM PHOSPHATE 20 MG/5ML IJ SOLN
12.0000 mg | Freq: Once | INTRAMUSCULAR | Status: AC
  Administered 2013-02-10: 12 mg via INTRAVENOUS

## 2013-02-10 MED ORDER — DEXAMETHASONE SODIUM PHOSPHATE 20 MG/5ML IJ SOLN
INTRAMUSCULAR | Status: AC
  Filled 2013-02-10: qty 5

## 2013-02-10 MED ORDER — PALONOSETRON HCL INJECTION 0.25 MG/5ML
0.2500 mg | Freq: Once | INTRAVENOUS | Status: AC
  Administered 2013-02-10: 0.25 mg via INTRAVENOUS

## 2013-02-10 MED ORDER — POTASSIUM CHLORIDE 2 MEQ/ML IV SOLN
Freq: Once | INTRAVENOUS | Status: AC
  Administered 2013-02-10: 08:00:00 via INTRAVENOUS
  Filled 2013-02-10: qty 10

## 2013-02-10 MED ORDER — SODIUM CHLORIDE 0.9 % IV SOLN
120.0000 mg/m2 | Freq: Once | INTRAVENOUS | Status: AC
  Administered 2013-02-10: 220 mg via INTRAVENOUS
  Filled 2013-02-10: qty 11

## 2013-02-10 MED ORDER — SODIUM CHLORIDE 0.9 % IV SOLN
Freq: Once | INTRAVENOUS | Status: AC
  Administered 2013-02-10: 08:00:00 via INTRAVENOUS

## 2013-02-10 MED ORDER — SODIUM CHLORIDE 0.9 % IV SOLN
150.0000 mg | Freq: Once | INTRAVENOUS | Status: AC
  Administered 2013-02-10: 150 mg via INTRAVENOUS
  Filled 2013-02-10: qty 5

## 2013-02-10 NOTE — Patient Instructions (Signed)
Cancer Center Discharge Instructions for Patients Receiving Chemotherapy  Today you received the following chemotherapy agents Cisplatin/Etoposide.  To help prevent nausea and vomiting after your treatment, we encourage you to take your nausea medication as prescribed.   If you develop nausea and vomiting that is not controlled by your nausea medication, call the clinic.   BELOW ARE SYMPTOMS THAT SHOULD BE REPORTED IMMEDIATELY:  *FEVER GREATER THAN 100.5 F  *CHILLS WITH OR WITHOUT FEVER  NAUSEA AND VOMITING THAT IS NOT CONTROLLED WITH YOUR NAUSEA MEDICATION  *UNUSUAL SHORTNESS OF BREATH  *UNUSUAL BRUISING OR BLEEDING  TENDERNESS IN MOUTH AND THROAT WITH OR WITHOUT PRESENCE OF ULCERS  *URINARY PROBLEMS  *BOWEL PROBLEMS  UNUSUAL RASH Items with * indicate a potential emergency and should be followed up as soon as possible.  Feel free to call the clinic you have any questions or concerns. The clinic phone number is [phone].    

## 2013-02-11 ENCOUNTER — Ambulatory Visit (HOSPITAL_BASED_OUTPATIENT_CLINIC_OR_DEPARTMENT_OTHER): Payer: Medicaid Other

## 2013-02-11 ENCOUNTER — Ambulatory Visit: Payer: Self-pay

## 2013-02-11 VITALS — BP 135/77 | HR 90 | Temp 97.7°F | Resp 18

## 2013-02-11 DIAGNOSIS — C3491 Malignant neoplasm of unspecified part of right bronchus or lung: Secondary | ICD-10-CM

## 2013-02-11 DIAGNOSIS — Z5111 Encounter for antineoplastic chemotherapy: Secondary | ICD-10-CM

## 2013-02-11 DIAGNOSIS — C7A1 Malignant poorly differentiated neuroendocrine tumors: Secondary | ICD-10-CM

## 2013-02-11 MED ORDER — SODIUM CHLORIDE 0.9 % IV SOLN
120.0000 mg/m2 | Freq: Once | INTRAVENOUS | Status: AC
  Administered 2013-02-11: 220 mg via INTRAVENOUS
  Filled 2013-02-11: qty 11

## 2013-02-11 MED ORDER — SODIUM CHLORIDE 0.9 % IV SOLN
Freq: Once | INTRAVENOUS | Status: AC
  Administered 2013-02-11: 11:00:00 via INTRAVENOUS

## 2013-02-11 MED ORDER — DEXAMETHASONE SODIUM PHOSPHATE 10 MG/ML IJ SOLN
10.0000 mg | Freq: Once | INTRAMUSCULAR | Status: AC
  Administered 2013-02-11: 10 mg via INTRAVENOUS

## 2013-02-11 MED ORDER — DEXAMETHASONE SODIUM PHOSPHATE 10 MG/ML IJ SOLN
INTRAMUSCULAR | Status: AC
  Filled 2013-02-11: qty 1

## 2013-02-11 NOTE — Patient Instructions (Signed)
[facility] Discharge Instructions for Patients Receiving Chemotherapy  Today you received the following chemotherapy agents Etoposide (VP 16) To help prevent nausea and vomiting after your treatment, we encourage you to take your nausea medication as prescribed.If you develop nausea and vomiting that is not controlled by your nausea medication, call the clinic.   BELOW ARE SYMPTOMS THAT SHOULD BE REPORTED IMMEDIATELY:  *FEVER GREATER THAN 100.5 F  *CHILLS WITH OR WITHOUT FEVER  NAUSEA AND VOMITING THAT IS NOT CONTROLLED WITH YOUR NAUSEA MEDICATION  *UNUSUAL SHORTNESS OF BREATH  *UNUSUAL BRUISING OR BLEEDING  TENDERNESS IN MOUTH AND THROAT WITH OR WITHOUT PRESENCE OF ULCERS  *URINARY PROBLEMS  *BOWEL PROBLEMS  UNUSUAL RASH Items with * indicate a potential emergency and should be followed up as soon as possible.  Feel free to call the clinic you have any questions or concerns. The clinic phone number is [phone].    

## 2013-02-12 ENCOUNTER — Ambulatory Visit (HOSPITAL_BASED_OUTPATIENT_CLINIC_OR_DEPARTMENT_OTHER): Payer: Medicaid Other

## 2013-02-12 VITALS — BP 149/73 | HR 67 | Temp 98.0°F | Resp 18

## 2013-02-12 DIAGNOSIS — C7A1 Malignant poorly differentiated neuroendocrine tumors: Secondary | ICD-10-CM

## 2013-02-12 DIAGNOSIS — C3491 Malignant neoplasm of unspecified part of right bronchus or lung: Secondary | ICD-10-CM

## 2013-02-12 DIAGNOSIS — Z5111 Encounter for antineoplastic chemotherapy: Secondary | ICD-10-CM

## 2013-02-12 MED ORDER — DEXAMETHASONE SODIUM PHOSPHATE 10 MG/ML IJ SOLN
INTRAMUSCULAR | Status: AC
  Filled 2013-02-12: qty 1

## 2013-02-12 MED ORDER — DEXAMETHASONE SODIUM PHOSPHATE 10 MG/ML IJ SOLN
10.0000 mg | Freq: Once | INTRAMUSCULAR | Status: AC
  Administered 2013-02-12: 10 mg via INTRAVENOUS

## 2013-02-12 MED ORDER — SODIUM CHLORIDE 0.9 % IV SOLN
120.0000 mg/m2 | Freq: Once | INTRAVENOUS | Status: AC
  Administered 2013-02-12: 220 mg via INTRAVENOUS
  Filled 2013-02-12: qty 11

## 2013-02-12 MED ORDER — SODIUM CHLORIDE 0.9 % IV SOLN
Freq: Once | INTRAVENOUS | Status: AC
  Administered 2013-02-12: 10:00:00 via INTRAVENOUS

## 2013-02-12 NOTE — Patient Instructions (Addendum)
[facility] Discharge Instructions for Patients Receiving Chemotherapy  Today you received the following chemotherapy agents:  etoposide  To help prevent nausea and vomiting after your treatment, we encourage you to take your nausea medication.  Take it as often as prescribed.     If you develop nausea and vomiting that is not controlled by your nausea medication, call the clinic. If it is after clinic hours your family physician or the after hours number for the clinic or go to the Emergency Department.   BELOW ARE SYMPTOMS THAT SHOULD BE REPORTED IMMEDIATELY:  *FEVER GREATER THAN 100.5 F  *CHILLS WITH OR WITHOUT FEVER  NAUSEA AND VOMITING THAT IS NOT CONTROLLED WITH YOUR NAUSEA MEDICATION  *UNUSUAL SHORTNESS OF BREATH  *UNUSUAL BRUISING OR BLEEDING  TENDERNESS IN MOUTH AND THROAT WITH OR WITHOUT PRESENCE OF ULCERS  *URINARY PROBLEMS  *BOWEL PROBLEMS  UNUSUAL RASH Items with * indicate a potential emergency and should be followed up as soon as possible.  Feel free to call the clinic you have any questions or concerns. The clinic phone number is [phone].   I have been informed and understand all the instructions given to me. I know to contact the clinic, my physician, or go to the Emergency Department if any problems should occur. I do not have any questions at this time, but understand that I may call the clinic during office hours   should I have any questions or need assistance in obtaining follow up care.    __________________________________________  _____________  __________ Signature of Patient or Authorized Representative            Date                   Time    __________________________________________ Nurse's Signature   Pegfilgrastim injection (Neulasta) What is this medicine? PEGFILGRASTIM (peg fil GRA stim) helps the body make more white blood cells. It is used to prevent infection in people with low amounts of white blood cells  following cancer treatment. This medicine may be used for other purposes; ask your health care provider or pharmacist if you have questions. What should I tell my health care provider before I take this medicine? They need to know if you have any of these conditions: -sickle cell disease -an unusual or allergic reaction to pegfilgrastim, filgrastim, E.coli protein, other medicines, foods, dyes, or preservatives -pregnant or trying to get pregnant -breast-feeding How should I use this medicine? This medicine is for injection under the skin. It is usually given by a health care professional in a hospital or clinic setting. If you get this medicine at home, you will be taught how to prepare and give this medicine. Do not shake this medicine. Use exactly as directed. Take your medicine at regular intervals. Do not take your medicine more often than directed. It is important that you put your used needles and syringes in a special sharps container. Do not put them in a trash can. If you do not have a sharps container, call your pharmacist or healthcare provider to get one. Talk to your pediatrician regarding the use of this medicine in children. While this drug may be prescribed for children who weigh more than 45 kg for selected conditions, precautions do apply Overdosage: If you think you have taken too much of this medicine contact a poison control center or emergency room at once. NOTE: This medicine is only for you. Do not share this medicine with others. What  if I miss a dose? If you miss a dose, take it as soon as you can. If it is almost time for your next dose, take only that dose. Do not take double or extra doses. What may interact with this medicine? -lithium -medicines for growth therapy This list may not describe all possible interactions. Give your health care provider a list of all the medicines, herbs, non-prescription drugs, or dietary supplements you use. Also tell them if you  smoke, drink alcohol, or use illegal drugs. Some items may interact with your medicine. What should I watch for while using this medicine? Visit your doctor for regular check ups. You will need important blood work done while you are taking this medicine. What side effects may I notice from receiving this medicine? Side effects that you should report to your doctor or health care professional as soon as possible: -allergic reactions like skin rash, itching or hives, swelling of the face, lips, or tongue -breathing problems -fever -pain, redness, or swelling where injected -shoulder pain -stomach or side pain Side effects that usually do not require medical attention (report to your doctor or health care professional if they continue or are bothersome): -aches, pains -headache -loss of appetite -nausea, vomiting -unusually tired This list may not describe all possible side effects. Call your doctor for medical advice about side effects. You may report side effects to FDA at 1-800-FDA-1088. Where should I keep my medicine? Keep out of the reach of children. Store in a refrigerator between 2 and 8 degrees C (36 and 46 degrees F). Do not freeze. Keep in carton to protect from light. Throw away this medicine if it is left out of the refrigerator for more than 48 hours. Throw away any unused medicine after the expiration date. NOTE: This sheet is a summary. It may not cover all possible information. If you have questions about this medicine, talk to your doctor, pharmacist, or health care provider.  2013, [employer]. (11/09/2007 3:41:44 PM)

## 2013-02-13 ENCOUNTER — Ambulatory Visit (HOSPITAL_BASED_OUTPATIENT_CLINIC_OR_DEPARTMENT_OTHER): Payer: Medicaid Other

## 2013-02-13 VITALS — BP 123/64 | HR 70 | Temp 98.4°F

## 2013-02-13 DIAGNOSIS — C7A1 Malignant poorly differentiated neuroendocrine tumors: Secondary | ICD-10-CM

## 2013-02-13 DIAGNOSIS — Z5189 Encounter for other specified aftercare: Secondary | ICD-10-CM

## 2013-02-13 DIAGNOSIS — C3491 Malignant neoplasm of unspecified part of right bronchus or lung: Secondary | ICD-10-CM

## 2013-02-13 MED ORDER — PEGFILGRASTIM INJECTION 6 MG/0.6ML
6.0000 mg | Freq: Once | SUBCUTANEOUS | Status: AC
  Administered 2013-02-13: 6 mg via SUBCUTANEOUS

## 2013-02-13 NOTE — Patient Instructions (Signed)
Pegfilgrastim injection (Neulasta) What is this medicine? PEGFILGRASTIM (peg fil GRA stim) helps the body make more white blood cells. It is used to prevent infection in people with low amounts of white blood cells following cancer treatment. This medicine may be used for other purposes; ask your health care provider or pharmacist if you have questions. What should I tell my health care provider before I take this medicine? They need to know if you have any of these conditions: -sickle cell disease -an unusual or allergic reaction to pegfilgrastim, filgrastim, E.coli protein, other medicines, foods, dyes, or preservatives -pregnant or trying to get pregnant -breast-feeding How should I use this medicine? This medicine is for injection under the skin. It is usually given by a health care professional in a hospital or clinic setting. If you get this medicine at home, you will be taught how to prepare and give this medicine. Do not shake this medicine. Use exactly as directed. Take your medicine at regular intervals. Do not take your medicine more often than directed. It is important that you put your used needles and syringes in a special sharps container. Do not put them in a trash can. If you do not have a sharps container, call your pharmacist or healthcare provider to get one. Talk to your pediatrician regarding the use of this medicine in children. While this drug may be prescribed for children who weigh more than 45 kg for selected conditions, precautions do apply Overdosage: If you think you have taken too much of this medicine contact a poison control center or emergency room at once. NOTE: This medicine is only for you. Do not share this medicine with others. What if I miss a dose? If you miss a dose, take it as soon as you can. If it is almost time for your next dose, take only that dose. Do not take double or extra doses. What may interact with this medicine? -lithium -medicines for  growth therapy This list may not describe all possible interactions. Give your health care provider a list of all the medicines, herbs, non-prescription drugs, or dietary supplements you use. Also tell them if you smoke, drink alcohol, or use illegal drugs. Some items may interact with your medicine. What should I watch for while using this medicine? Visit your doctor for regular check ups. You will need important blood work done while you are taking this medicine. What side effects may I notice from receiving this medicine? Side effects that you should report to your doctor or health care professional as soon as possible: -allergic reactions like skin rash, itching or hives, swelling of the face, lips, or tongue -breathing problems -fever -pain, redness, or swelling where injected -shoulder pain -stomach or side pain Side effects that usually do not require medical attention (report to your doctor or health care professional if they continue or are bothersome): -aches, pains -headache -loss of appetite -nausea, vomiting -unusually tired This list may not describe all possible side effects. Call your doctor for medical advice about side effects. You may report side effects to FDA at 1-800-FDA-1088. Where should I keep my medicine? Keep out of the reach of children. Store in a refrigerator between 2 and 8 degrees C (36 and 46 degrees F). Do not freeze. Keep in carton to protect from light. Throw away this medicine if it is left out of the refrigerator for more than 48 hours. Throw away any unused medicine after the expiration date. NOTE: This sheet is a summary. It  may not cover all possible information. If you have questions about this medicine, talk to your doctor, pharmacist, or health care provider.  2013, [employer] Standard. (11/09/2007 3:41:44 PM)

## 2013-02-15 ENCOUNTER — Other Ambulatory Visit: Payer: Self-pay | Admitting: Lab

## 2013-02-15 ENCOUNTER — Ambulatory Visit: Payer: Self-pay | Admitting: Physician Assistant

## 2013-02-15 ENCOUNTER — Telehealth: Payer: Self-pay | Admitting: *Deleted

## 2013-02-15 NOTE — Telephone Encounter (Signed)
Message copied by Phillis Knack on Mon Feb 15, 2013  1:53 PM ------      Message from: Jolaine Artist      Created: Wed Feb 10, 2013  3:00 PM      Regarding: chemo follow-up call       1st Cisplatin/VP-16  Dr. Arbutus Ped   [phone] ------

## 2013-02-15 NOTE — Telephone Encounter (Signed)
Chemo follow up call. Pt states she did very well post chemo, but has been hurting since taking the neulasta injection. Complaining of back pain and nausea. Is taking compazine, but still feels like throwing up unless she lays down after taking pill. Encourage to take tylenol extra strength to help with bone pain, pt is taking Claritin. She will discuss this with Tiana Loft, PA at her office visit tomorrow.

## 2013-02-16 ENCOUNTER — Telehealth: Payer: Self-pay | Admitting: Internal Medicine

## 2013-02-16 ENCOUNTER — Ambulatory Visit (HOSPITAL_BASED_OUTPATIENT_CLINIC_OR_DEPARTMENT_OTHER): Payer: Medicaid Other | Admitting: Physician Assistant

## 2013-02-16 ENCOUNTER — Other Ambulatory Visit (HOSPITAL_BASED_OUTPATIENT_CLINIC_OR_DEPARTMENT_OTHER): Payer: Medicaid Other | Admitting: Lab

## 2013-02-16 DIAGNOSIS — R918 Other nonspecific abnormal finding of lung field: Secondary | ICD-10-CM

## 2013-02-16 DIAGNOSIS — R222 Localized swelling, mass and lump, trunk: Secondary | ICD-10-CM

## 2013-02-16 DIAGNOSIS — C3490 Malignant neoplasm of unspecified part of unspecified bronchus or lung: Secondary | ICD-10-CM

## 2013-02-16 DIAGNOSIS — C7A1 Malignant poorly differentiated neuroendocrine tumors: Secondary | ICD-10-CM

## 2013-02-16 LAB — COMPREHENSIVE METABOLIC PANEL (CC13)
ALT: 17 U/L (ref 0–55)
AST: 14 U/L (ref 5–34)
Albumin: 3 g/dL — ABNORMAL LOW (ref 3.5–5.0)
Anion Gap: 11 mEq/L (ref 3–11)
BUN: 39.4 mg/dL — ABNORMAL HIGH (ref 7.0–26.0)
Calcium: 9.6 mg/dL (ref 8.4–10.4)
Chloride: 103 mEq/L (ref 98–109)
Creatinine: 1.2 mg/dL — ABNORMAL HIGH (ref 0.6–1.1)
Glucose: 243 mg/dl — ABNORMAL HIGH (ref 70–140)
Sodium: 140 mEq/L (ref 136–145)
Total Bilirubin: 0.53 mg/dL (ref 0.20–1.20)

## 2013-02-16 LAB — CBC WITH DIFFERENTIAL/PLATELET
BASO%: 0.2 % (ref 0.0–2.0)
Basophils Absolute: 0 10*3/uL (ref 0.0–0.1)
EOS%: 0.5 % (ref 0.0–7.0)
HCT: 37.1 % (ref 34.8–46.6)
HGB: 12.2 g/dL (ref 11.6–15.9)
LYMPH%: 7.2 % — ABNORMAL LOW (ref 14.0–49.7)
MCH: 29.6 pg (ref 25.1–34.0)
MCHC: 32.8 g/dL (ref 31.5–36.0)
MCV: 90.1 fL (ref 79.5–101.0)
NEUT%: 91.7 % — ABNORMAL HIGH (ref 38.4–76.8)
Platelets: 252 10*3/uL (ref 145–400)
lymph#: 1.5 10*3/uL (ref 0.9–3.3)

## 2013-02-16 NOTE — Telephone Encounter (Signed)
gv and printed appt sched and avs for pt for NOV...ok to sched with AJ per Forde Radon

## 2013-02-17 ENCOUNTER — Telehealth: Payer: Self-pay | Admitting: Medical Oncology

## 2013-02-17 MED ORDER — PROMETHAZINE HCL 25 MG PO TABS: 25.0000 mg | ORAL_TABLET | Freq: Four times a day (QID) | ORAL | Status: DC | PRN

## 2013-02-17 MED ORDER — ONDANSETRON HCL 8 MG PO TABS: 8.0000 mg | ORAL_TABLET | Freq: Three times a day (TID) | ORAL | Status: DC | PRN

## 2013-02-17 NOTE — Telephone Encounter (Signed)
Persistent nausea . Compazine not working so she requested something else at [facility] on 10/28. She did not get rx . On call provider called in phenergan last night ans pt took it and said she slept all night without nausea

## 2013-02-17 NOTE — Patient Instructions (Signed)
Continue weekly labs as scheduled Follow up in 2 weeks, prior to the start of your next cycle of chemotherapy 

## 2013-02-17 NOTE — Progress Notes (Addendum)
No images are attached to the encounter. No scans are attached to the encounter. No scans are attached to the encounter. [facility] SHARED VISIT PROGRESS NOTE  No PCP Per Patient [address] [place] [address]  DIAGNOSIS:    Limited stage small cell lung cancer       PRIOR THERAPY: None  CURRENT THERAPY: Systemic chemotherapy with cisplatin at 60 mg per meter square given on day 1 and etoposide at 120 mg per meter squared given on days 1, 2 and 3 with Neulasta support given on day 4 every 3 weeks. A total 4-6 cycles are planned depending on response  DISEASE STAGE: Limited stage  CHEMOTHERAPY INTENT: Control  CURRENT # OF CHEMOTHERAPY CYCLES: 1  CURRENT ANTIEMETICS: Compazine, Phenergan, dexamethasone, Zofran, Aloxi, Emend  CURRENT SMOKING STATUS: Former smoker, quit 01/30/2013  ORAL CHEMOTHERAPY AND CONSENT: n/a  CURRENT BISPHOSPHONATES USE: none  PAIN MANAGEMENT: none  NARCOTICS INDUCED CONSTIPATION:   LIVING WILL AND CODE STATUS:    INTERVAL HISTORY: [name] 57 y.o. female returns for a scheduled regular symptom management visit for followup of her recently diagnosed limited stage small cell lung cancer. She is status post one cycle of systemic chemotherapy with cisplatin and etoposide with Neulasta port. Overall she tolerated the chemotherapy without difficulty. She did have some delayed nausea and vomiting after receiving her Neulasta injection. She is able to keep soft starches down but not much else. She reports a cough and states that she had been diagnosed with pneumonia but was unable to tolerate the antibiotics and they were discontinued. She denied any fever or chills, no hemoptysis, night sweats, diarrhea or constipation.   MEDICAL HISTORY: Past Medical History  Diagnosis Date  . Tobacco abuse   . Diabetes mellitus   . Mitral regurgitation   . Hypertension   . Dyslipidemia   . Coronary artery disease   . History of  echocardiogram 7/10    EF 50-55%; inferior hypokinesis; RV function mild decreased  . ACE-inhibitor cough   . Multiple allergies     Penicillin and mushrooms    ALLERGIES:  is allergic to codeine and penicillins.  MEDICATIONS:  Current Outpatient Prescriptions  Medication Sig Dispense Refill  . aspirin 81 MG tablet Take 81 mg by mouth daily.      . B Complex-C (SUPER B COMPLEX PO) Take 1 tablet by mouth daily.      . isosorbide mononitrate (IMDUR) 30 MG 24 hr tablet Take 30 mg by mouth daily.        Marland Kitchen loratadine (CLARITIN) 10 MG tablet Take 10 mg by mouth daily.      Marland Kitchen lovastatin (MEVACOR) 20 MG tablet Take 20 mg by mouth at bedtime.      . magnesium oxide (MAG-OX) 400 (241.3 MG) MG tablet Take 1 tablet (400 mg total) by mouth 3 (three) times daily.  90 tablet  0  . metFORMIN (GLUCOPHAGE) 850 MG tablet Take 850 mg by mouth 2 (two) times daily with a meal.      . metoprolol (TOPROL-XL) 50 MG 24 hr tablet Take 50 mg by mouth daily.        . Misc Natural Products (ENERGY FOCUS) TABS Take by mouth.      . prochlorperazine (COMPAZINE) 10 MG tablet Take 1 tablet (10 mg total) by mouth every 6 (six) hours as needed.  60 tablet  0  . valsartan (DIOVAN) 160 MG tablet Take 160 mg by mouth daily.        Marland Kitchen  promethazine (PHENERGAN) 25 MG tablet Take 1 tablet (25 mg total) by mouth every 6 (six) hours as needed for nausea.  30 tablet  0   No current facility-administered medications for this visit.    SURGICAL HISTORY:  Past Surgical History  Procedure Laterality Date  . Video bronchoscopy Bilateral 01/26/2013    Procedure: VIDEO BRONCHOSCOPY WITHOUT FLUORO;  Surgeon: Nyoka Cowden, MD;  Location: [facility];  Service: Cardiopulmonary;  Laterality: Bilateral;    REVIEW OF SYSTEMS:  Constitutional: negative Eyes: negative Ears, nose, mouth, throat, and face: negative Respiratory: positive for cough Cardiovascular: negative Gastrointestinal: positive for nausea and  vomiting Genitourinary:negative Integument/breast: negative Hematologic/lymphatic: negative Musculoskeletal:negative Neurological: negative Behavioral/Psych: negative   PHYSICAL EXAMINATION: General appearance: alert, cooperative, appears stated age and no distress Head: Normocephalic, without obvious abnormality, atraumatic Neck: no adenopathy, no carotid bruit, no JVD, supple, symmetrical, trachea midline and thyroid not enlarged, symmetric, no tenderness/mass/nodules Lymph nodes: Cervical, supraclavicular, and axillary nodes normal. Resp: clear to auscultation bilaterally Cardio: regular rate and rhythm, S1, S2 normal, no murmur, click, rub or gallop GI: soft, non-tender; bowel sounds normal; no masses,  no organomegaly Extremities: extremities normal, atraumatic, no cyanosis or edema Neurologic: Alert and oriented X 3, normal strength and tone. Normal symmetric reflexes. Normal coordination and gait  ECOG PERFORMANCE STATUS: 1 - Symptomatic but completely ambulatory  There were no vitals taken for this visit.  LABORATORY DATA: Lab Results  Component Value Date   WBC 20.3* 02/16/2013   HGB 12.2 02/16/2013   HCT 37.1 02/16/2013   MCV 90.1 02/16/2013   PLT 252 02/16/2013      Chemistry      Component Value Date/Time   NA 140 02/16/2013 1045   NA 138 10/16/2006 0420   K 5.1 02/16/2013 1045   K 3.8 10/16/2006 0420   CL 105 10/16/2006 0420   CO2 26 02/16/2013 1045   CO2 27 10/16/2006 0420   BUN 39.4* 02/16/2013 1045   BUN 11 10/16/2006 0420   CREATININE 1.2* 02/16/2013 1045   CREATININE 0.63 10/16/2006 0420      Component Value Date/Time   CALCIUM 9.6 02/16/2013 1045   CALCIUM 9.2 10/16/2006 0420   ALKPHOS 114 02/16/2013 1045   ALKPHOS 64 10/16/2006 0420   AST 14 02/16/2013 1045   AST 17 10/16/2006 0420   ALT 17 02/16/2013 1045   ALT 18 10/16/2006 0420   BILITOT 0.53 02/16/2013 1045   BILITOT 0.8 10/16/2006 0420       RADIOGRAPHIC STUDIES:  Nm Pet Image Initial (pi)  Skull Base To Thigh  02/05/2013   CLINICAL DATA:  Initial treatment strategy for 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  EXAM: NUCLEAR MEDICINE PET SKULL BASE TO THIGH  FASTING BLOOD GLUCOSE:  Value:  133 mg/dl  TECHNIQUE: 16.1 mCi W-96 FDG was injected intravenously. CT data was obtained and used for attenuation correction and anatomic localization only. (This was not acquired as a diagnostic CT examination.) Additional exam technical data entered on technologist worksheet.  COMPARISON:  Multiple exams, including 01/21/2013 and 01/12/2013  FINDINGS: NECK  Asymmetric activity in the right submandibular gland noted, is, maximum standard uptake value 3.9, compared to 3.0 on the contralateral side. Right supraclavicular node, 1 cm in short axis, maximum standard uptake value 12.5. Left supraclavicular node, 0.7 cm in short axis, maximum standard uptake value 6.0.  CHEST  A large right upper lobe mass extends into the hilum, and has a maximum standard uptake value of 18.5.  Drowned lung appearance in the right upper lobe noted with a satellite right upper lobe nodule with maximum standard uptake value of 8.0. There is a right pleural effusion and focal hypermetabolic activity along the inferomedial margin of this effusion with maximum standard uptake value of 6.1, suspicious for malignant effusion.  Extensive thoracic adenopathy is observed. Anterior mediastinal lymph node is partially calcified, 1.8 cm in short axis, and with maximum standard uptake value of 11.5. Extensive right paratracheal and subcarinal adenopathy, with subcarinal adenopathy having maximum standard uptake value of 16.9 and right paratracheal adenopathy with short axis diameter of 2.1 cm and maximum standard uptake value 12.8. An epicardial lymph node adjacent to the right atrium has short axis diameter of 1.3 cm in maximum standard uptake value of 10.2.  Scattered tiny nodules are present in the left lung  and remaining aerated portion of the right lung. Emphysema.  ABDOMEN/PELVIS  No abnormal hypermetabolic activity within the liver, pancreas, adrenal glands, or spleen. No hypermetabolic lymph nodes in the abdomen or pelvis.  SKELETON  No focal hypermetabolic activity to suggest skeletal metastasis.  IMPRESSION: 1. Large right upper lobe mass with extensive mediastinal adenopathy, satellite lesions, and suspected malignant right pleural effusion. 2. Scattered tiny nodules in both lungs could be inflammatory or neoplastic. These merit observation. 3. Emphysema. 4. Asymmetric activity in the right submandibular gland, low grade, probably due to inflammation.   Electronically Signed   By: Herbie Baltimore M.D.   On: 02/05/2013 13:05     ASSESSMENT/PLAN: The patient is a very pleasant 57 year old white female recently diagnosed with limited stage small cell lung cancer. She is now status post one cycle of systemic chemotherapy with cisplatin at 60 mg per meter square given on day 1 and etoposide 120 mg per meter squared on days one 2 and 3 with Neulasta support given on day 4, every 3 weeks. A total of 4-6 cycles is planned depending on response. Overall she tolerated her chemotherapy relatively well with the exception of some delayed nausea and vomiting despite her current antiemetics. Patient was discussed with him also seen by Dr. Arbutus Ped. We will add Zofran for her home antibiotics, a prescription was sent her pharmacy via [employer] [employer]. She will continue with weekly labs and return in 2 weeks prior to the start of cycle #2 of her systemic chemotherapy with cisplatin and etoposide with Neulasta support.    Laural Benes, Adina Puzzo E, PA-C     All questions were answered. The patient knows to call the clinic with any problems, questions or concerns. We can certainly see the patient much sooner if necessary.  ADDENDUM: Hematology/Oncology Attending:  I had a face to face encounter with the patient. I recommended  her care plan. This is a very pleasant 57 years old female recently diagnosed with limited stage small cell lung cancer currently undergoing systemic chemotherapy with cisplatin and etoposide status post 1 cycle. She tolerated the first cycle fairly well except for mild fatigue and delayed nausea likely from the cisplatin. We'll give the patient prescription for Zofran to be used for nausea on as-needed basis. She would come back for follow up visit in 2 weeks for evaluation before starting cycle #2. She was advised to call immediately if she has any concerning symptoms in the interval. Lajuana Matte., MD 02/21/2013

## 2013-02-21 ENCOUNTER — Encounter: Payer: Self-pay | Admitting: Physician Assistant

## 2013-02-22 ENCOUNTER — Other Ambulatory Visit (HOSPITAL_BASED_OUTPATIENT_CLINIC_OR_DEPARTMENT_OTHER): Payer: Medicaid Other | Admitting: Lab

## 2013-02-22 DIAGNOSIS — C7A1 Malignant poorly differentiated neuroendocrine tumors: Secondary | ICD-10-CM

## 2013-02-22 DIAGNOSIS — C3491 Malignant neoplasm of unspecified part of right bronchus or lung: Secondary | ICD-10-CM

## 2013-02-22 LAB — COMPREHENSIVE METABOLIC PANEL (CC13)
ALT: 17 U/L (ref 0–55)
AST: 28 U/L (ref 5–34)
Alkaline Phosphatase: 88 U/L (ref 40–150)
Anion Gap: 12 mEq/L — ABNORMAL HIGH (ref 3–11)
BUN: 21.2 mg/dL (ref 7.0–26.0)
CO2: 23 mEq/L (ref 22–29)
Calcium: 9.7 mg/dL (ref 8.4–10.4)
Chloride: 105 mEq/L (ref 98–109)
Creatinine: 1.4 mg/dL — ABNORMAL HIGH (ref 0.6–1.1)
Potassium: 5.4 mEq/L — ABNORMAL HIGH (ref 3.5–5.1)
Sodium: 141 mEq/L (ref 136–145)
Total Bilirubin: <0.2 mg/dL (ref 0.20–1.20)
Total Protein: 6.8 g/dL (ref 6.4–8.3)

## 2013-02-22 LAB — CBC WITH DIFFERENTIAL/PLATELET
BASO%: 0.7 % (ref 0.0–2.0)
Basophils Absolute: 0.1 10*3/uL (ref 0.0–0.1)
EOS%: 1.6 % (ref 0.0–7.0)
HCT: 34.1 % — ABNORMAL LOW (ref 34.8–46.6)
HGB: 11.3 g/dL — ABNORMAL LOW (ref 11.6–15.9)
LYMPH%: 14.3 % (ref 14.0–49.7)
MCH: 29.9 pg (ref 25.1–34.0)
MCHC: 33.1 g/dL (ref 31.5–36.0)
MCV: 90.5 fL (ref 79.5–101.0)
MONO%: 5.5 % (ref 0.0–14.0)
NEUT#: 16 10*3/uL — ABNORMAL HIGH (ref 1.5–6.5)
NEUT%: 77.9 % — ABNORMAL HIGH (ref 38.4–76.8)
Platelets: 98 10*3/uL — ABNORMAL LOW (ref 145–400)
RDW: 12.9 % (ref 11.2–14.5)
WBC: 20.5 10*3/uL — ABNORMAL HIGH (ref 3.9–10.3)
lymph#: 2.9 10*3/uL (ref 0.9–3.3)

## 2013-02-24 ENCOUNTER — Telehealth: Payer: Self-pay | Admitting: *Deleted

## 2013-02-24 NOTE — Telephone Encounter (Signed)
Progress note and letter from Dr. Thersa Salt at [facility] dated 02/09/13 given to Dr Donnald Garre to review.

## 2013-02-25 ENCOUNTER — Other Ambulatory Visit: Payer: Self-pay | Admitting: Internal Medicine

## 2013-03-01 ENCOUNTER — Encounter: Payer: Self-pay | Admitting: Physician Assistant

## 2013-03-01 ENCOUNTER — Ambulatory Visit (HOSPITAL_BASED_OUTPATIENT_CLINIC_OR_DEPARTMENT_OTHER): Payer: Medicaid Other | Admitting: Physician Assistant

## 2013-03-01 ENCOUNTER — Other Ambulatory Visit: Payer: Self-pay | Admitting: Lab

## 2013-03-01 ENCOUNTER — Ambulatory Visit: Payer: Self-pay | Admitting: Nutrition

## 2013-03-01 ENCOUNTER — Other Ambulatory Visit (HOSPITAL_BASED_OUTPATIENT_CLINIC_OR_DEPARTMENT_OTHER): Payer: Medicaid Other | Admitting: Lab

## 2013-03-01 ENCOUNTER — Ambulatory Visit (HOSPITAL_BASED_OUTPATIENT_CLINIC_OR_DEPARTMENT_OTHER): Payer: Medicaid Other

## 2013-03-01 ENCOUNTER — Telehealth: Payer: Self-pay | Admitting: Internal Medicine

## 2013-03-01 VITALS — BP 142/76 | HR 87 | Temp 97.3°F | Resp 18 | Ht 66.0 in | Wt 162.2 lb

## 2013-03-01 DIAGNOSIS — C3410 Malignant neoplasm of upper lobe, unspecified bronchus or lung: Secondary | ICD-10-CM

## 2013-03-01 DIAGNOSIS — C3491 Malignant neoplasm of unspecified part of right bronchus or lung: Secondary | ICD-10-CM

## 2013-03-01 DIAGNOSIS — C7A1 Malignant poorly differentiated neuroendocrine tumors: Secondary | ICD-10-CM

## 2013-03-01 DIAGNOSIS — Z5111 Encounter for antineoplastic chemotherapy: Secondary | ICD-10-CM

## 2013-03-01 DIAGNOSIS — C3490 Malignant neoplasm of unspecified part of unspecified bronchus or lung: Secondary | ICD-10-CM

## 2013-03-01 DIAGNOSIS — R112 Nausea with vomiting, unspecified: Secondary | ICD-10-CM

## 2013-03-01 LAB — CBC WITH DIFFERENTIAL/PLATELET
Basophils Absolute: 0.1 10*3/uL (ref 0.0–0.1)
Eosinophils Absolute: 0.4 10*3/uL (ref 0.0–0.5)
HGB: 10.5 g/dL — ABNORMAL LOW (ref 11.6–15.9)
LYMPH%: 15.1 % (ref 14.0–49.7)
MONO#: 1 10*3/uL — ABNORMAL HIGH (ref 0.1–0.9)
NEUT#: 13.2 10*3/uL — ABNORMAL HIGH (ref 1.5–6.5)
Platelets: 181 10*3/uL (ref 145–400)
RBC: 3.5 10*6/uL — ABNORMAL LOW (ref 3.70–5.45)
RDW: 12.9 % (ref 11.2–14.5)
WBC: 17.3 10*3/uL — ABNORMAL HIGH (ref 3.9–10.3)
nRBC: 0 % (ref 0–0)

## 2013-03-01 LAB — COMPREHENSIVE METABOLIC PANEL (CC13)
ALT: 13 U/L (ref 0–55)
Anion Gap: 13 mEq/L — ABNORMAL HIGH (ref 3–11)
CO2: 22 mEq/L (ref 22–29)
Calcium: 9.8 mg/dL (ref 8.4–10.4)
Chloride: 103 mEq/L (ref 98–109)
Glucose: 208 mg/dl — ABNORMAL HIGH (ref 70–140)
Sodium: 139 mEq/L (ref 136–145)
Total Protein: 7.6 g/dL (ref 6.4–8.3)

## 2013-03-01 MED ORDER — DEXAMETHASONE SODIUM PHOSPHATE 20 MG/5ML IJ SOLN
12.0000 mg | Freq: Once | INTRAMUSCULAR | Status: AC
  Administered 2013-03-01: 12:00:00 via INTRAVENOUS

## 2013-03-01 MED ORDER — DEXAMETHASONE SODIUM PHOSPHATE 20 MG/5ML IJ SOLN
INTRAMUSCULAR | Status: AC
  Filled 2013-03-01: qty 5

## 2013-03-01 MED ORDER — POTASSIUM CHLORIDE 2 MEQ/ML IV SOLN
Freq: Once | INTRAVENOUS | Status: AC
  Administered 2013-03-01: 10:00:00 via INTRAVENOUS
  Filled 2013-03-01: qty 10

## 2013-03-01 MED ORDER — SODIUM CHLORIDE 0.9 % IV SOLN
150.0000 mg | Freq: Once | INTRAVENOUS | Status: AC
  Administered 2013-03-01: 150 mg via INTRAVENOUS
  Filled 2013-03-01: qty 5

## 2013-03-01 MED ORDER — SODIUM CHLORIDE 0.9 % IV SOLN
Freq: Once | INTRAVENOUS | Status: AC
  Administered 2013-03-01: 10:00:00 via INTRAVENOUS

## 2013-03-01 MED ORDER — PALONOSETRON HCL INJECTION 0.25 MG/5ML
0.2500 mg | Freq: Once | INTRAVENOUS | Status: AC
  Administered 2013-03-01: 0.25 mg via INTRAVENOUS

## 2013-03-01 MED ORDER — PALONOSETRON HCL INJECTION 0.25 MG/5ML
INTRAVENOUS | Status: AC
  Filled 2013-03-01: qty 5

## 2013-03-01 MED ORDER — SODIUM CHLORIDE 0.9 % IV SOLN
60.0000 mg/m2 | Freq: Once | INTRAVENOUS | Status: AC
  Administered 2013-03-01: 110 mg via INTRAVENOUS
  Filled 2013-03-01: qty 110

## 2013-03-01 MED ORDER — SODIUM CHLORIDE 0.9 % IV SOLN
120.0000 mg/m2 | Freq: Once | INTRAVENOUS | Status: AC
  Administered 2013-03-01: 220 mg via INTRAVENOUS
  Filled 2013-03-01: qty 11

## 2013-03-01 NOTE — Progress Notes (Signed)
1500--Patient with complaints of severe nausea and vomiting with last treatment. Christell Faith, PhD came to infusion room to discuss anti-emetic protocol. Verbal and written instructions discussed and given to patient and daughter. Patient understands to call should she have nausea beyond current plan.

## 2013-03-01 NOTE — Patient Instructions (Addendum)
[facility] Discharge Instructions for Patients Receiving Chemotherapy  Today you received the following chemotherapy agents Cisplatin/Etoposide.  To help prevent nausea and vomiting after your treatment, we encourage you to take your nausea medication as directed.Marland Kitchen  DRINK LOTS OF FLUIDS AND HYDRATE WELL!   If you develop nausea and vomiting that is not controlled by your nausea medication, call the clinic.   BELOW ARE SYMPTOMS THAT SHOULD BE REPORTED IMMEDIATELY:  *FEVER GREATER THAN 100.5 F  *CHILLS WITH OR WITHOUT FEVER  NAUSEA AND VOMITING THAT IS NOT CONTROLLED WITH YOUR NAUSEA MEDICATION  *UNUSUAL SHORTNESS OF BREATH  *UNUSUAL BRUISING OR BLEEDING  TENDERNESS IN MOUTH AND THROAT WITH OR WITHOUT PRESENCE OF ULCERS  *URINARY PROBLEMS  *BOWEL PROBLEMS  UNUSUAL RASH Items with * indicate a potential emergency and should be followed up as soon as possible.  Feel free to call the clinic you have any questions or concerns. The clinic phone number is [phone].

## 2013-03-01 NOTE — Progress Notes (Signed)
Patient is a 57 year old female diagnosed with small cell lung cancer receiving cisplatin and etoposide.  Past medical history includes tobacco, diabetes, hypertension, dyslipidemia, and CAD.  Medications include Compazine, Phenergan, Zofran, Aloxi, Emend, dexamethasone, B complex, vitamins, Mevacor, Glucophage, and magnesium oxide.  Labs include creatinine 1.3 and potassium 5.4 on November 3.  Height: 66 inches. Weight: 162.2 pounds November 10. Usual body weight 168 pounds in August 2010. BMI: 26.19.  Patient describes delayed nausea after her first chemotherapy.  She tolerated bland pasta.  She verbalizes concern that increased carbohydrates will increase blood sugar levels.  Nutrition diagnosis: Food and nutrition related knowledge deficit related to diagnosis of small cell lung cancer and associated treatments as evidenced by no prior need for nutrition related information.  Intervention: Patient was educated on strategies for increased oral intake especially with nausea and vomiting.  I have reviewed high-calorie, high-protein foods with patient.  I've educated on strategies for improving taste.  Patient encouraged to consume small, frequent meals and snacks.  She understands goal is weight maintenance.  Fact sheets were provided.  Questions were answered.  Teach back method used.  My contact information was given.  Monitoring, evaluation, goals: Patient will tolerate adequate calories and protein to minimize weight loss throughout treatment and minimize nutrition impact symptoms.  Next visit: Monday, December 1, during chemotherapy.

## 2013-03-01 NOTE — Patient Instructions (Signed)
Continue with weekly labs as scheduled Followup with Dr. Arbutus Ped in 3 weeks with a restaging CT scan of your chest to reevaluate your disease

## 2013-03-01 NOTE — Telephone Encounter (Signed)
[facility] pt appt for lab,MDs and chemo for November and December 2014

## 2013-03-01 NOTE — Progress Notes (Addendum)
No images are attached to the encounter. No scans are attached to the encounter. No scans are attached to the encounter. [facility] SHARED VISIT PROGRESS NOTE  No PCP Per Patient [address] [place] [address]  DIAGNOSIS:    Limited stage small cell lung cancer       PRIOR THERAPY: None  CURRENT THERAPY: Systemic chemotherapy with cisplatin at 60 mg per meter square given on day 1 and etoposide at 120 mg per meter squared given on days 1, 2 and 3 with Neulasta support given on day 4 every 3 weeks. A total 4-6 cycles are planned depending on response. Status post 1 cycle  DISEASE STAGE: Limited stage  CHEMOTHERAPY INTENT: Control  CURRENT # OF CHEMOTHERAPY CYCLES: 2  CURRENT ANTIEMETICS: Compazine, Phenergan, dexamethasone, Zofran, Aloxi, Emend  CURRENT SMOKING STATUS: Former smoker, quit 01/30/2013  ORAL CHEMOTHERAPY AND CONSENT: n/a  CURRENT BISPHOSPHONATES USE: none  PAIN MANAGEMENT: none  NARCOTICS INDUCED CONSTIPATION:   LIVING WILL AND CODE STATUS:    INTERVAL HISTORY: [name] 57 y.o. female returns for a scheduled follow up visit regarding her recently diagnosed limited stage small cell lung cancer. She is status post one cycle of systemic chemotherapy with cisplatin and etoposide with Neulasta support. Overall she tolerated the chemotherapy without difficulty. She did have some delayed nausea and vomiting after receiving her Neulasta injection. She is accompanied today by her daughter to 89 he is visiting from [place]. She reports a cough when she is lying on her right side otherwise she does not have much of a cough. She denied any fever or chills, no hemoptysis, night sweats, diarrhea or constipation. She presents to proceed with cycle #2 of her systemic chemotherapy with cisplatin and etoposide with Neulasta support.  MEDICAL HISTORY: Past Medical History  Diagnosis Date  . Tobacco abuse   . Diabetes mellitus   . Mitral  regurgitation   . Hypertension   . Dyslipidemia   . Coronary artery disease   . History of echocardiogram 7/10    EF 50-55%; inferior hypokinesis; RV function mild decreased  . ACE-inhibitor cough   . Multiple allergies     Penicillin and mushrooms    ALLERGIES:  is allergic to codeine and penicillins.  MEDICATIONS:  Current Outpatient Prescriptions  Medication Sig Dispense Refill  . aspirin 81 MG tablet Take 81 mg by mouth daily.      . B Complex-C (SUPER B COMPLEX PO) Take 1 tablet by mouth daily.      . isosorbide mononitrate (IMDUR) 30 MG 24 hr tablet Take 30 mg by mouth daily.        Marland Kitchen loratadine (CLARITIN) 10 MG tablet Take 10 mg by mouth daily.      Marland Kitchen lovastatin (MEVACOR) 20 MG tablet Take 20 mg by mouth at bedtime.      . magnesium oxide (MAG-OX) 400 (241.3 MG) MG tablet Take 1 tablet (400 mg total) by mouth 3 (three) times daily.  90 tablet  0  . metFORMIN (GLUCOPHAGE) 850 MG tablet Take 850 mg by mouth 2 (two) times daily with a meal.      . metoprolol (TOPROL-XL) 50 MG 24 hr tablet Take 50 mg by mouth daily.        . Misc Natural Products (ENERGY FOCUS) TABS Take by mouth.      . ondansetron (ZOFRAN) 8 MG tablet Take 1 tablet (8 mg total) by mouth every 8 (eight) hours as needed for nausea.  20 tablet  2  . promethazine (PHENERGAN) 25 MG tablet Take 1 tablet (25 mg total) by mouth every 6 (six) hours as needed for nausea.  30 tablet  0  . valsartan (DIOVAN) 160 MG tablet Take 160 mg by mouth daily.        . prochlorperazine (COMPAZINE) 10 MG tablet Take 1 tablet (10 mg total) by mouth every 6 (six) hours as needed.  60 tablet  0   No current facility-administered medications for this visit.   Facility-Administered Medications Ordered in Other Visits  Medication Dose Route Frequency Provider Last Rate Last Dose  . CISplatin (PLATINOL) 110 mg in sodium chloride 0.9 % 500 mL chemo infusion  60 mg/m2 (Treatment Plan Actual) Intravenous Once Si Gaul, MD      .  Dexamethasone Sodium Phosphate (DECADRON) injection 12 mg  12 mg Intravenous Once Si Gaul, MD      . etoposide (VEPESID) 220 mg in sodium chloride 0.9 % 600 mL chemo infusion  120 mg/m2 (Treatment Plan Actual) Intravenous Once Si Gaul, MD      . fosaprepitant (EMEND) 150 mg in sodium chloride 0.9 % 145 mL IVPB  150 mg Intravenous Once Si Gaul, MD      . palonosetron (ALOXI) injection 0.25 mg  0.25 mg Intravenous Once Si Gaul, MD        SURGICAL HISTORY:  Past Surgical History  Procedure Laterality Date  . Video bronchoscopy Bilateral 01/26/2013    Procedure: VIDEO BRONCHOSCOPY WITHOUT FLUORO;  Surgeon: Nyoka Cowden, MD;  Location: [facility];  Service: Cardiopulmonary;  Laterality: Bilateral;    REVIEW OF SYSTEMS:  Constitutional: negative Eyes: negative Ears, nose, mouth, throat, and face: negative Respiratory: positive for cough Cardiovascular: negative Gastrointestinal: positive for nausea and vomiting Genitourinary:negative Integument/breast: negative Hematologic/lymphatic: negative Musculoskeletal:negative Neurological: negative Behavioral/Psych: negative   PHYSICAL EXAMINATION: General appearance: alert, cooperative, appears stated age and no distress Head: Normocephalic, without obvious abnormality, atraumatic Neck: no adenopathy, no carotid bruit, no JVD, supple, symmetrical, trachea midline and thyroid not enlarged, symmetric, no tenderness/mass/nodules Lymph nodes: Cervical, supraclavicular, and axillary nodes normal. Resp: clear to auscultation bilaterally Cardio: regular rate and rhythm, S1, S2 normal, no murmur, click, rub or gallop GI: soft, non-tender; bowel sounds normal; no masses,  no organomegaly Extremities: extremities normal, atraumatic, no cyanosis or edema Neurologic: Alert and oriented X 3, normal strength and tone. Normal symmetric reflexes. Normal coordination and gait  ECOG PERFORMANCE STATUS: 1 - Symptomatic but  completely ambulatory  Blood pressure 142/76, pulse 87, temperature 97.3 F (36.3 C), temperature source Oral, resp. rate 18, height 5\' 6"  (1.676 m), weight 162 lb 3.2 oz (73.573 kg), SpO2 98.00%.  LABORATORY DATA: Lab Results  Component Value Date   WBC 17.3* 03/01/2013   HGB 10.5* 03/01/2013   HCT 31.6* 03/01/2013   MCV 90.3 03/01/2013   PLT 181 03/01/2013      Chemistry      Component Value Date/Time   NA 141 02/22/2013 0938   NA 138 10/16/2006 0420   K 5.4* 02/22/2013 0938   K 3.8 10/16/2006 0420   CL 105 10/16/2006 0420   CO2 23 02/22/2013 0938   CO2 27 10/16/2006 0420   BUN 21.2 02/22/2013 0938   BUN 11 10/16/2006 0420   CREATININE 1.4* 02/22/2013 0938   CREATININE 0.63 10/16/2006 0420      Component Value Date/Time   CALCIUM 9.7 02/22/2013 0938   CALCIUM 9.2 10/16/2006 0420   ALKPHOS 88 02/22/2013 1610  ALKPHOS 64 10/16/2006 0420   AST 28 02/22/2013 0938   AST 17 10/16/2006 0420   ALT 17 02/22/2013 0938   ALT 18 10/16/2006 0420   BILITOT <0.20 02/22/2013 0938   BILITOT 0.8 10/16/2006 0420       RADIOGRAPHIC STUDIES:  Nm Pet Image Initial (pi) Skull Base To Thigh  02/05/2013   CLINICAL DATA:  Initial treatment strategy for 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  EXAM: NUCLEAR MEDICINE PET SKULL BASE TO THIGH  FASTING BLOOD GLUCOSE:  Value:  133 mg/dl  TECHNIQUE: 40.9 mCi W-11 FDG was injected intravenously. CT data was obtained and used for attenuation correction and anatomic localization only. (This was not acquired as a diagnostic CT examination.) Additional exam technical data entered on technologist worksheet.  COMPARISON:  Multiple exams, including 01/21/2013 and 01/12/2013  FINDINGS: NECK  Asymmetric activity in the right submandibular gland noted, is, maximum standard uptake value 3.9, compared to 3.0 on the contralateral side. Right supraclavicular node, 1 cm in short axis, maximum standard uptake value 12.5. Left supraclavicular  node, 0.7 cm in short axis, maximum standard uptake value 6.0.  CHEST  A large right upper lobe mass extends into the hilum, and has a maximum standard uptake value of 18.5. Drowned lung appearance in the right upper lobe noted with a satellite right upper lobe nodule with maximum standard uptake value of 8.0. There is a right pleural effusion and focal hypermetabolic activity along the inferomedial margin of this effusion with maximum standard uptake value of 6.1, suspicious for malignant effusion.  Extensive thoracic adenopathy is observed. Anterior mediastinal lymph node is partially calcified, 1.8 cm in short axis, and with maximum standard uptake value of 11.5. Extensive right paratracheal and subcarinal adenopathy, with subcarinal adenopathy having maximum standard uptake value of 16.9 and right paratracheal adenopathy with short axis diameter of 2.1 cm and maximum standard uptake value 12.8. An epicardial lymph node adjacent to the right atrium has short axis diameter of 1.3 cm in maximum standard uptake value of 10.2.  Scattered tiny nodules are present in the left lung and remaining aerated portion of the right lung. Emphysema.  ABDOMEN/PELVIS  No abnormal hypermetabolic activity within the liver, pancreas, adrenal glands, or spleen. No hypermetabolic lymph nodes in the abdomen or pelvis.  SKELETON  No focal hypermetabolic activity to suggest skeletal metastasis.  IMPRESSION: 1. Large right upper lobe mass with extensive mediastinal adenopathy, satellite lesions, and suspected malignant right pleural effusion. 2. Scattered tiny nodules in both lungs could be inflammatory or neoplastic. These merit observation. 3. Emphysema. 4. Asymmetric activity in the right submandibular gland, low grade, probably due to inflammation.   Electronically Signed   By: Herbie Baltimore M.D.   On: 02/05/2013 13:05     ASSESSMENT/PLAN: The patient is a very pleasant 57 year old white female recently diagnosed with limited  stage small cell lung cancer. She is now status post one cycle of systemic chemotherapy with cisplatin at 60 mg per meter square given on day 1 and etoposide 120 mg per meter squared on days one 2 and 3 with Neulasta support given on day 4, every 3 weeks. A total of 4-6 cycles is planned depending on response. Overall she tolerated her chemotherapy relatively well with the exception of some delayed nausea and vomiting despite her current antiemetics. Patient was discussed with him also seen by Dr. Arbutus Ped. The patient and her daughter had a few questions which were all answered to their satisfaction. She will continue  to use Zofran in addition to her Compazine as needed for nausea and vomiting. She'll followup with Dr. Arbutus Ped in 3 weeks with a restaging CT scan of her chest with contrast to reevaluate her disease. She will continue with weekly labs and return in 3 weeks prior to the start of cycle #3 of her systemic chemotherapy with cisplatin and etoposide with Neulasta support.    Laural Benes, Abrish Erny E, PA-C     All questions were answered. The patient knows to call the clinic with any problems, questions or concerns. We can certainly see the patient much sooner if necessary.  ADDENDUM: Hematology/Oncology Attending: I had a face to face encounter with the patient today. I recommended her care plan. This is a very pleasant 57 years old white female who came to the clinic today accompanied by her daughter. She is recently diagnosed with limited stage small cell lung cancer in October of 2014 and currently undergoing systemic chemotherapy with cisplatin and etoposide status post 1 cycle. The patient tolerated the first cycle of her treatment fairly well except for some delayed nausea and vomiting. She was started on treatment with Zofran with improvement in her nausea and vomiting. She denied having any significant fever or chills. The patient has no chest pain but continues to have shortness of breath  with exertion. I recommended for the patient to proceed with cycle #2 today as scheduled. She would come back for followup visit in 3 weeks with the next cycle of her treatment after repeating CT scan of the chest for restaging of her disease. She was advised to call immediately if she has any concerning symptoms in the interval. Lajuana Matte., MD 03/01/2013

## 2013-03-02 ENCOUNTER — Other Ambulatory Visit: Payer: Self-pay | Admitting: Lab

## 2013-03-02 ENCOUNTER — Ambulatory Visit (HOSPITAL_BASED_OUTPATIENT_CLINIC_OR_DEPARTMENT_OTHER): Payer: Medicaid Other

## 2013-03-02 ENCOUNTER — Ambulatory Visit: Payer: Self-pay | Admitting: Internal Medicine

## 2013-03-02 VITALS — BP 158/75 | HR 80 | Temp 97.3°F

## 2013-03-02 DIAGNOSIS — C7A1 Malignant poorly differentiated neuroendocrine tumors: Secondary | ICD-10-CM

## 2013-03-02 DIAGNOSIS — Z5111 Encounter for antineoplastic chemotherapy: Secondary | ICD-10-CM

## 2013-03-02 DIAGNOSIS — C3491 Malignant neoplasm of unspecified part of right bronchus or lung: Secondary | ICD-10-CM

## 2013-03-02 MED ORDER — SODIUM CHLORIDE 0.9 % IV SOLN
120.0000 mg/m2 | Freq: Once | INTRAVENOUS | Status: AC
  Administered 2013-03-02: 220 mg via INTRAVENOUS
  Filled 2013-03-02: qty 11

## 2013-03-02 MED ORDER — SODIUM CHLORIDE 0.9 % IV SOLN
Freq: Once | INTRAVENOUS | Status: AC
  Administered 2013-03-02: 09:00:00 via INTRAVENOUS

## 2013-03-02 MED ORDER — DEXAMETHASONE SODIUM PHOSPHATE 10 MG/ML IJ SOLN
10.0000 mg | Freq: Once | INTRAMUSCULAR | Status: AC
  Administered 2013-03-02: 10 mg via INTRAVENOUS

## 2013-03-02 MED ORDER — DEXAMETHASONE SODIUM PHOSPHATE 10 MG/ML IJ SOLN
INTRAMUSCULAR | Status: AC
  Filled 2013-03-02: qty 1

## 2013-03-02 NOTE — Patient Instructions (Addendum)
[facility] Discharge Instructions for Patients Receiving Chemotherapy  Today you received the following chemotherapy agents Vp16 (Etoposide).  To help prevent nausea and vomiting after your treatment, we encourage you to take your Phenergan. For nausea on or after 11/13, take Zofran.   If you develop nausea and vomiting that is not controlled by your nausea medication, call the clinic.   BELOW ARE SYMPTOMS THAT SHOULD BE REPORTED IMMEDIATELY:  *FEVER GREATER THAN 100.5 F  *CHILLS WITH OR WITHOUT FEVER  NAUSEA AND VOMITING THAT IS NOT CONTROLLED WITH YOUR NAUSEA MEDICATION  *UNUSUAL SHORTNESS OF BREATH  *UNUSUAL BRUISING OR BLEEDING  TENDERNESS IN MOUTH AND THROAT WITH OR WITHOUT PRESENCE OF ULCERS  *URINARY PROBLEMS  *BOWEL PROBLEMS  UNUSUAL RASH Items with * indicate a potential emergency and should be followed up as soon as possible.  Feel free to call the clinic should you have any questions or concerns. The clinic phone number is [phone].

## 2013-03-03 ENCOUNTER — Ambulatory Visit (HOSPITAL_BASED_OUTPATIENT_CLINIC_OR_DEPARTMENT_OTHER): Payer: Medicaid Other

## 2013-03-03 VITALS — BP 131/63 | HR 81 | Temp 98.2°F | Resp 18

## 2013-03-03 DIAGNOSIS — C7A1 Malignant poorly differentiated neuroendocrine tumors: Secondary | ICD-10-CM

## 2013-03-03 DIAGNOSIS — C3491 Malignant neoplasm of unspecified part of right bronchus or lung: Secondary | ICD-10-CM

## 2013-03-03 DIAGNOSIS — Z5111 Encounter for antineoplastic chemotherapy: Secondary | ICD-10-CM

## 2013-03-03 MED ORDER — DEXAMETHASONE SODIUM PHOSPHATE 10 MG/ML IJ SOLN
10.0000 mg | Freq: Once | INTRAMUSCULAR | Status: AC
  Administered 2013-03-03: 10 mg via INTRAVENOUS

## 2013-03-03 MED ORDER — DEXAMETHASONE SODIUM PHOSPHATE 10 MG/ML IJ SOLN
INTRAMUSCULAR | Status: AC
  Filled 2013-03-03: qty 1

## 2013-03-03 MED ORDER — SODIUM CHLORIDE 0.9 % IV SOLN
120.0000 mg/m2 | Freq: Once | INTRAVENOUS | Status: AC
  Administered 2013-03-03: 220 mg via INTRAVENOUS
  Filled 2013-03-03: qty 11

## 2013-03-03 MED ORDER — SODIUM CHLORIDE 0.9 % IV SOLN
Freq: Once | INTRAVENOUS | Status: AC
  Administered 2013-03-03: 10:00:00 via INTRAVENOUS

## 2013-03-03 NOTE — Patient Instructions (Signed)
[facility] Discharge Instructions for Patients Receiving Chemotherapy  Today you received the following chemotherapy agents: Etoposide (VP16).  To help prevent nausea and vomiting after your treatment, we encourage you to take your nausea medication, Phenergan every 6 hours as needed. (May make you drowsy.) For nausea on or after 11/13, take Zofran.   If you develop nausea and vomiting that is not controlled by your nausea medication, call the clinic.   BELOW ARE SYMPTOMS THAT SHOULD BE REPORTED IMMEDIATELY:  *FEVER GREATER THAN 100.5 F  *CHILLS WITH OR WITHOUT FEVER  NAUSEA AND VOMITING THAT IS NOT CONTROLLED WITH YOUR NAUSEA MEDICATION  *UNUSUAL SHORTNESS OF BREATH  *UNUSUAL BRUISING OR BLEEDING  TENDERNESS IN MOUTH AND THROAT WITH OR WITHOUT PRESENCE OF ULCERS  *URINARY PROBLEMS  *BOWEL PROBLEMS  UNUSUAL RASH Items with * indicate a potential emergency and should be followed up as soon as possible.  Feel free to call the clinic should you have any questions or concerns. The clinic phone number is [phone].

## 2013-03-04 ENCOUNTER — Ambulatory Visit (HOSPITAL_BASED_OUTPATIENT_CLINIC_OR_DEPARTMENT_OTHER): Payer: Medicaid Other

## 2013-03-04 VITALS — BP 124/64 | HR 84 | Temp 98.5°F

## 2013-03-04 DIAGNOSIS — Z5189 Encounter for other specified aftercare: Secondary | ICD-10-CM

## 2013-03-04 DIAGNOSIS — C7A1 Malignant poorly differentiated neuroendocrine tumors: Secondary | ICD-10-CM

## 2013-03-04 DIAGNOSIS — C3491 Malignant neoplasm of unspecified part of right bronchus or lung: Secondary | ICD-10-CM

## 2013-03-04 MED ORDER — PEGFILGRASTIM INJECTION 6 MG/0.6ML
6.0000 mg | Freq: Once | SUBCUTANEOUS | Status: AC
  Administered 2013-03-04: 6 mg via SUBCUTANEOUS
  Filled 2013-03-04: qty 0.6

## 2013-03-04 NOTE — Patient Instructions (Signed)

## 2013-03-08 ENCOUNTER — Encounter: Payer: Self-pay | Admitting: Physician Assistant

## 2013-03-08 ENCOUNTER — Telehealth: Payer: Self-pay | Admitting: *Deleted

## 2013-03-08 ENCOUNTER — Other Ambulatory Visit (HOSPITAL_BASED_OUTPATIENT_CLINIC_OR_DEPARTMENT_OTHER): Payer: Medicaid Other

## 2013-03-08 DIAGNOSIS — C7A1 Malignant poorly differentiated neuroendocrine tumors: Secondary | ICD-10-CM

## 2013-03-08 DIAGNOSIS — C3491 Malignant neoplasm of unspecified part of right bronchus or lung: Secondary | ICD-10-CM

## 2013-03-08 LAB — COMPREHENSIVE METABOLIC PANEL (CC13)
ALT: 16 U/L (ref 0–55)
AST: 13 U/L (ref 5–34)
Alkaline Phosphatase: 118 U/L (ref 40–150)
Anion Gap: 10 mEq/L (ref 3–11)
BUN: 31.8 mg/dL — ABNORMAL HIGH (ref 7.0–26.0)
CO2: 25 mEq/L (ref 22–29)
Calcium: 9.7 mg/dL (ref 8.4–10.4)
Chloride: 104 mEq/L (ref 98–109)
Creatinine: 1.2 mg/dL — ABNORMAL HIGH (ref 0.6–1.1)
Glucose: 368 mg/dl — ABNORMAL HIGH (ref 70–140)
Total Bilirubin: 0.39 mg/dL (ref 0.20–1.20)
Total Protein: 6.6 g/dL (ref 6.4–8.3)

## 2013-03-08 LAB — CBC WITH DIFFERENTIAL/PLATELET
BASO%: 0.3 % (ref 0.0–2.0)
Basophils Absolute: 0 10*3/uL (ref 0.0–0.1)
EOS%: 0.5 % (ref 0.0–7.0)
Eosinophils Absolute: 0.1 10*3/uL (ref 0.0–0.5)
HCT: 30.2 % — ABNORMAL LOW (ref 34.8–46.6)
HGB: 10 g/dL — ABNORMAL LOW (ref 11.6–15.9)
LYMPH%: 7.1 % — ABNORMAL LOW (ref 14.0–49.7)
MONO#: 0.1 10*3/uL (ref 0.1–0.9)
NEUT#: 13.9 10*3/uL — ABNORMAL HIGH (ref 1.5–6.5)
NEUT%: 91.5 % — ABNORMAL HIGH (ref 38.4–76.8)
Platelets: 311 10*3/uL (ref 145–400)
WBC: 15.2 10*3/uL — ABNORMAL HIGH (ref 3.9–10.3)
lymph#: 1.1 10*3/uL (ref 0.9–3.3)

## 2013-03-08 LAB — MAGNESIUM (CC13): Magnesium: 1.8 mg/dl (ref 1.5–2.5)

## 2013-03-08 NOTE — Telephone Encounter (Signed)
Called patient who reports this rash on the right side of her chin started Friday.  "It itches a little and stings.  I scratched it and fluid drained and formed scabs.  When I squeeze the bumps they open and drain.  It seems familiar to me when I was a little girl but that's all I remember."  Received cisplatin/Vp-16 03-01-2013 through 03-03-2013.  Denies any new medicines or facial cleansers.  Also denies having a PCP.  "Calls the health department to schedule an appointment when needed."  Will notify Providers and return call with any new provider orders.  Instructed to not squeeze or burst the water blisters.

## 2013-03-08 NOTE — Telephone Encounter (Signed)
No note

## 2013-03-08 NOTE — Telephone Encounter (Signed)
Called patient to follow up on skin rash.  Patient stated the rash looks like a burn and only drains when scratching.  Patient also stated she has been applying neosporin and the rash is not getting better.   Instructed patient not to scratch, to continue using neosporin and to apply cool compresses.  Also, instructed patient if rash gets any worse to call, and Adrena would work her in on Wednesday 11/19.  Per Tiana Loft, PA.  Patient verbalized understanding.

## 2013-03-12 ENCOUNTER — Encounter: Payer: Self-pay | Admitting: Internal Medicine

## 2013-03-12 NOTE — Progress Notes (Signed)
Patient left and I called her back and said she was out and needed to call me back.

## 2013-03-15 ENCOUNTER — Other Ambulatory Visit (HOSPITAL_BASED_OUTPATIENT_CLINIC_OR_DEPARTMENT_OTHER): Payer: Medicaid Other

## 2013-03-15 DIAGNOSIS — C3491 Malignant neoplasm of unspecified part of right bronchus or lung: Secondary | ICD-10-CM

## 2013-03-15 DIAGNOSIS — C7A1 Malignant poorly differentiated neuroendocrine tumors: Secondary | ICD-10-CM

## 2013-03-15 LAB — CBC WITH DIFFERENTIAL/PLATELET
Basophils Absolute: 0.2 10*3/uL — ABNORMAL HIGH (ref 0.0–0.1)
EOS%: 1.8 % (ref 0.0–7.0)
Eosinophils Absolute: 0.5 10*3/uL (ref 0.0–0.5)
LYMPH%: 10.9 % — ABNORMAL LOW (ref 14.0–49.7)
MCH: 29.8 pg (ref 25.1–34.0)
MCV: 90.7 fL (ref 79.5–101.0)
MONO%: 3.9 % (ref 0.0–14.0)
NEUT#: 24.5 10*3/uL — ABNORMAL HIGH (ref 1.5–6.5)
Platelets: 64 10*3/uL — ABNORMAL LOW (ref 145–400)
RBC: 3.13 10*6/uL — ABNORMAL LOW (ref 3.70–5.45)
RDW: 13.3 % (ref 11.2–14.5)
lymph#: 3.2 10*3/uL (ref 0.9–3.3)

## 2013-03-15 LAB — COMPREHENSIVE METABOLIC PANEL (CC13)
ALT: 19 U/L (ref 0–55)
AST: 24 U/L (ref 5–34)
Albumin: 3 g/dL — ABNORMAL LOW (ref 3.5–5.0)
Alkaline Phosphatase: 104 U/L (ref 40–150)
Chloride: 105 mEq/L (ref 98–109)
Glucose: 260 mg/dl — ABNORMAL HIGH (ref 70–140)
Potassium: 4.6 mEq/L (ref 3.5–5.1)
Sodium: 141 mEq/L (ref 136–145)
Total Bilirubin: <0.2 mg/dL (ref 0.20–1.20)
Total Protein: 6.3 g/dL — ABNORMAL LOW (ref 6.4–8.3)

## 2013-03-17 ENCOUNTER — Telehealth: Payer: Self-pay | Admitting: *Deleted

## 2013-03-17 ENCOUNTER — Encounter: Payer: Self-pay | Admitting: Internal Medicine

## 2013-03-17 DIAGNOSIS — C3490 Malignant neoplasm of unspecified part of unspecified bronchus or lung: Secondary | ICD-10-CM

## 2013-03-17 MED ORDER — ALBUTEROL SULFATE HFA 108 (90 BASE) MCG/ACT IN AERS: 2.0000 | INHALATION_SPRAY | Freq: Four times a day (QID) | RESPIRATORY_TRACT | Status: DC | PRN

## 2013-03-17 NOTE — Progress Notes (Signed)
Received notice that she is approved 01/26/13-05/22/13 id [id number] M  Medicaid. Already in the system,.

## 2013-03-17 NOTE — Telephone Encounter (Signed)
Pt called stating that she has a discomfort in her chest and has some new wheezing that has been going on for 2-3 days.  She does not have a cough or fever.  Does not feel she is in any distress.  Per Dr Donnald Garre, pt can have albuterol q6h prn wheezing and will have a CT done on Friday 11/28.  SLJ

## 2013-03-19 ENCOUNTER — Encounter (HOSPITAL_COMMUNITY): Payer: Self-pay

## 2013-03-19 ENCOUNTER — Ambulatory Visit (HOSPITAL_COMMUNITY)
Admission: RE | Admit: 2013-03-19 | Discharge: 2013-03-19 | Disposition: A | Discharge status: Home / Self Care | Payer: Medicaid Other | Source: Ambulatory Visit | Attending: Physician Assistant | Admitting: Physician Assistant

## 2013-03-19 DIAGNOSIS — J438 Other emphysema: Secondary | ICD-10-CM | POA: Insufficient documentation

## 2013-03-19 DIAGNOSIS — Z79899 Other long term (current) drug therapy: Secondary | ICD-10-CM | POA: Insufficient documentation

## 2013-03-19 DIAGNOSIS — R599 Enlarged lymph nodes, unspecified: Secondary | ICD-10-CM | POA: Insufficient documentation

## 2013-03-19 DIAGNOSIS — R110 Nausea: Secondary | ICD-10-CM | POA: Insufficient documentation

## 2013-03-19 DIAGNOSIS — J90 Pleural effusion, not elsewhere classified: Secondary | ICD-10-CM | POA: Insufficient documentation

## 2013-03-19 DIAGNOSIS — C3490 Malignant neoplasm of unspecified part of unspecified bronchus or lung: Secondary | ICD-10-CM

## 2013-03-19 DIAGNOSIS — R918 Other nonspecific abnormal finding of lung field: Secondary | ICD-10-CM | POA: Insufficient documentation

## 2013-03-19 DIAGNOSIS — I700 Atherosclerosis of aorta: Secondary | ICD-10-CM | POA: Insufficient documentation

## 2013-03-19 DIAGNOSIS — I2510 Atherosclerotic heart disease of native coronary artery without angina pectoris: Secondary | ICD-10-CM | POA: Insufficient documentation

## 2013-03-19 MED ORDER — IOHEXOL 300 MG/ML  SOLN
80.0000 mL | Freq: Once | INTRAMUSCULAR | Status: AC | PRN
  Administered 2013-03-19: 80 mL via INTRAVENOUS

## 2013-03-22 ENCOUNTER — Other Ambulatory Visit: Payer: Self-pay | Admitting: Lab

## 2013-03-22 ENCOUNTER — Encounter: Payer: Self-pay | Admitting: Internal Medicine

## 2013-03-22 ENCOUNTER — Other Ambulatory Visit (HOSPITAL_BASED_OUTPATIENT_CLINIC_OR_DEPARTMENT_OTHER): Payer: Medicaid Other | Admitting: Lab

## 2013-03-22 ENCOUNTER — Ambulatory Visit (HOSPITAL_BASED_OUTPATIENT_CLINIC_OR_DEPARTMENT_OTHER): Payer: Medicaid Other | Admitting: Internal Medicine

## 2013-03-22 ENCOUNTER — Ambulatory Visit (HOSPITAL_BASED_OUTPATIENT_CLINIC_OR_DEPARTMENT_OTHER): Payer: Medicaid Other

## 2013-03-22 ENCOUNTER — Ambulatory Visit: Payer: Medicaid Other | Admitting: Nutrition

## 2013-03-22 VITALS — BP 130/76 | HR 80 | Temp 98.2°F | Resp 18 | Ht 66.0 in | Wt 165.9 lb

## 2013-03-22 DIAGNOSIS — C3410 Malignant neoplasm of upper lobe, unspecified bronchus or lung: Secondary | ICD-10-CM

## 2013-03-22 DIAGNOSIS — C3491 Malignant neoplasm of unspecified part of right bronchus or lung: Secondary | ICD-10-CM

## 2013-03-22 DIAGNOSIS — Z5111 Encounter for antineoplastic chemotherapy: Secondary | ICD-10-CM

## 2013-03-22 DIAGNOSIS — R0609 Other forms of dyspnea: Secondary | ICD-10-CM

## 2013-03-22 DIAGNOSIS — R5381 Other malaise: Secondary | ICD-10-CM

## 2013-03-22 LAB — MAGNESIUM (CC13): Magnesium: 1.8 mg/dL (ref 1.5–2.5)

## 2013-03-22 LAB — COMPREHENSIVE METABOLIC PANEL (CC13)
ALT: 14 U/L (ref 0–55)
AST: 16 U/L (ref 5–34)
Albumin: 3.5 g/dL (ref 3.5–5.0)
Alkaline Phosphatase: 84 U/L (ref 40–150)
Anion Gap: 10 mEq/L (ref 3–11)
BUN: 18.7 mg/dL (ref 7.0–26.0)
CO2: 23 mEq/L (ref 22–29)
Calcium: 9.2 mg/dL (ref 8.4–10.4)
Chloride: 107 mEq/L (ref 98–109)
Potassium: 4.4 mEq/L (ref 3.5–5.1)

## 2013-03-22 LAB — CBC WITH DIFFERENTIAL/PLATELET
BASO%: 0.6 % (ref 0.0–2.0)
EOS%: 1.8 % (ref 0.0–7.0)
MCH: 30 pg (ref 25.1–34.0)
MCHC: 32.7 g/dL (ref 31.5–36.0)
MONO#: 1.1 10*3/uL — ABNORMAL HIGH (ref 0.1–0.9)
NEUT%: 72.8 % (ref 38.4–76.8)
RBC: 2.97 10*6/uL — ABNORMAL LOW (ref 3.70–5.45)
RDW: 15.5 % — ABNORMAL HIGH (ref 11.2–14.5)
WBC: 13.7 10*3/uL — ABNORMAL HIGH (ref 3.9–10.3)
lymph#: 2.4 10*3/uL (ref 0.9–3.3)
nRBC: 1 % — ABNORMAL HIGH (ref 0–0)

## 2013-03-22 MED ORDER — PALONOSETRON HCL INJECTION 0.25 MG/5ML
INTRAVENOUS | Status: AC
  Filled 2013-03-22: qty 5

## 2013-03-22 MED ORDER — DEXAMETHASONE SODIUM PHOSPHATE 20 MG/5ML IJ SOLN
12.0000 mg | Freq: Once | INTRAMUSCULAR | Status: AC
  Administered 2013-03-22: 12 mg via INTRAVENOUS

## 2013-03-22 MED ORDER — PALONOSETRON HCL INJECTION 0.25 MG/5ML
0.2500 mg | Freq: Once | INTRAVENOUS | Status: AC
  Administered 2013-03-22: 0.25 mg via INTRAVENOUS

## 2013-03-22 MED ORDER — SODIUM CHLORIDE 0.9 % IV SOLN
120.0000 mg/m2 | Freq: Once | INTRAVENOUS | Status: AC
  Administered 2013-03-22: 220 mg via INTRAVENOUS
  Filled 2013-03-22: qty 11

## 2013-03-22 MED ORDER — DEXAMETHASONE SODIUM PHOSPHATE 20 MG/5ML IJ SOLN
INTRAMUSCULAR | Status: AC
  Filled 2013-03-22: qty 5

## 2013-03-22 MED ORDER — SODIUM CHLORIDE 0.9 % IV SOLN
60.0000 mg/m2 | Freq: Once | INTRAVENOUS | Status: AC
  Administered 2013-03-22: 110 mg via INTRAVENOUS
  Filled 2013-03-22: qty 110

## 2013-03-22 MED ORDER — POTASSIUM CHLORIDE 2 MEQ/ML IV SOLN
Freq: Once | INTRAVENOUS | Status: AC
  Administered 2013-03-22: 11:00:00 via INTRAVENOUS
  Filled 2013-03-22: qty 10

## 2013-03-22 MED ORDER — SODIUM CHLORIDE 0.9 % IV SOLN
150.0000 mg | Freq: Once | INTRAVENOUS | Status: AC
  Administered 2013-03-22: 150 mg via INTRAVENOUS
  Filled 2013-03-22: qty 5

## 2013-03-22 MED ORDER — SODIUM CHLORIDE 0.9 % IV SOLN
Freq: Once | INTRAVENOUS | Status: AC
  Administered 2013-03-22: 11:00:00 via INTRAVENOUS

## 2013-03-22 NOTE — Progress Notes (Signed)
[facility]  [phone] [phone]   OFFICE PROGRESS NOTE  No PCP Per Patient [address] [place] [address]  DIAGNOSIS:    Limited stage small cell lung cancer       PRIOR THERAPY: None  CURRENT THERAPY: Systemic chemotherapy with cisplatin at 60 mg per meter square given on day 1 and etoposide at 120 mg per meter squared given on days 1, 2 and 3 with Neulasta support given on day 4 every 3 weeks. A total 4-6 cycles are planned depending on response. Status post 1 cycle  DISEASE STAGE: Limited stage  CHEMOTHERAPY INTENT: Control  CURRENT # OF CHEMOTHERAPY CYCLES: 2  CURRENT ANTIEMETICS: Compazine, Phenergan, dexamethasone, Zofran, Aloxi, Emend  CURRENT SMOKING STATUS: Former smoker, quit 01/30/2013  ORAL CHEMOTHERAPY AND CONSENT: n/a  CURRENT BISPHOSPHONATES USE: none  PAIN MANAGEMENT: none  NARCOTICS INDUCED CONSTIPATION: None  LIVING WILL AND CODE STATUS: Full Code   INTERVAL HISTORY: [name] 57 y.o. female returns for a scheduled follow up visit accompanied by her daughter. She tolerated the second cycle of her chemotherapy fairly well with no significant complaints except for mild fatigue. She denied having any significant fever or chills. She has no nausea or vomiting. The patient denied having any significant chest pain but continues to have shortness of breath with exertion was no cough or hemoptysis. She had repeat CT scan of the chest performed recently and she is here for evaluation and discussion of her scan results.  MEDICAL HISTORY: Past Medical History  Diagnosis Date  . Tobacco abuse   . Diabetes mellitus   . Mitral regurgitation   . Hypertension   . Dyslipidemia   . Coronary artery disease   . History of echocardiogram 7/10    EF 50-55%; inferior hypokinesis; RV function mild decreased  . ACE-inhibitor cough   . Multiple allergies     Penicillin and mushrooms    ALLERGIES:  is allergic to  codeine and penicillins.  MEDICATIONS:  Current Outpatient Prescriptions  Medication Sig Dispense Refill  . albuterol (PROVENTIL HFA;VENTOLIN HFA) 108 (90 BASE) MCG/ACT inhaler Inhale 2 puffs into the lungs every 6 (six) hours as needed for wheezing or shortness of breath.  1 Inhaler  2  . aspirin 81 MG tablet Take 81 mg by mouth daily.      . B Complex-C (SUPER B COMPLEX PO) Take 1 tablet by mouth daily.      . isosorbide mononitrate (IMDUR) 30 MG 24 hr tablet Take 30 mg by mouth daily.        Marland Kitchen lovastatin (MEVACOR) 20 MG tablet Take 20 mg by mouth at bedtime.      . magnesium oxide (MAG-OX) 400 (241.3 MG) MG tablet Take 1 tablet (400 mg total) by mouth 3 (three) times daily.  90 tablet  0  . metFORMIN (GLUCOPHAGE) 850 MG tablet Take 850 mg by mouth 2 (two) times daily with a meal.      . metoprolol (TOPROL-XL) 50 MG 24 hr tablet Take 50 mg by mouth daily.        . Misc Natural Products (ENERGY FOCUS) TABS Take by mouth.      . valsartan (DIOVAN) 160 MG tablet Take 160 mg by mouth daily.        Marland Kitchen loratadine (CLARITIN) 10 MG tablet Take 10 mg by mouth daily.      . ondansetron (ZOFRAN) 8 MG tablet Take 1 tablet (8 mg total) by mouth every  8 (eight) hours as needed for nausea.  20 tablet  2  . promethazine (PHENERGAN) 25 MG tablet Take 1 tablet (25 mg total) by mouth every 6 (six) hours as needed for nausea.  30 tablet  0   No current facility-administered medications for this visit.    SURGICAL HISTORY:  Past Surgical History  Procedure Laterality Date  . Video bronchoscopy Bilateral 01/26/2013    Procedure: VIDEO BRONCHOSCOPY WITHOUT FLUORO;  Surgeon: Nyoka Cowden, MD;  Location: [facility];  Service: Cardiopulmonary;  Laterality: Bilateral;    REVIEW OF SYSTEMS:  Constitutional: negative Eyes: negative Ears, nose, mouth, throat, and face: negative Respiratory: positive for dyspnea on exertion Cardiovascular: negative Gastrointestinal:  negative Genitourinary:negative Integument/breast: negative Hematologic/lymphatic: negative Musculoskeletal:negative Neurological: negative Behavioral/Psych: negative   PHYSICAL EXAMINATION: General appearance: alert, cooperative, appears stated age and no distress Head: Normocephalic, without obvious abnormality, atraumatic Neck: no adenopathy, no carotid bruit, no JVD, supple, symmetrical, trachea midline and thyroid not enlarged, symmetric, no tenderness/mass/nodules Lymph nodes: Cervical, supraclavicular, and axillary nodes normal. Resp: clear to auscultation bilaterally Cardio: regular rate and rhythm, S1, S2 normal, no murmur, click, rub or gallop GI: soft, non-tender; bowel sounds normal; no masses,  no organomegaly Extremities: extremities normal, atraumatic, no cyanosis or edema Neurologic: Alert and oriented X 3, normal strength and tone. Normal symmetric reflexes. Normal coordination and gait  ECOG PERFORMANCE STATUS: 1 - Symptomatic but completely ambulatory  Blood pressure 130/76, pulse 80, temperature 98.2 F (36.8 C), temperature source Oral, resp. rate 18, height 5\' 6"  (1.676 m), weight 165 lb 14.4 oz (75.252 kg), SpO2 98.00%.  LABORATORY DATA: Lab Results  Component Value Date   WBC 13.7* 03/22/2013   HGB 8.9* 03/22/2013   HCT 27.2* 03/22/2013   MCV 91.6 03/22/2013   PLT 182 03/22/2013      Chemistry      Component Value Date/Time   NA 141 03/15/2013 0922   NA 138 10/16/2006 0420   K 4.6 03/15/2013 0922   K 3.8 10/16/2006 0420   CL 105 10/16/2006 0420   CO2 26 03/15/2013 0922   CO2 27 10/16/2006 0420   BUN 11.9 03/15/2013 0922   BUN 11 10/16/2006 0420   CREATININE 1.0 03/15/2013 0922   CREATININE 0.63 10/16/2006 0420      Component Value Date/Time   CALCIUM 9.0 03/15/2013 0922   CALCIUM 9.2 10/16/2006 0420   ALKPHOS 104 03/15/2013 0922   ALKPHOS 64 10/16/2006 0420   AST 24 03/15/2013 0922   AST 17 10/16/2006 0420   ALT 19 03/15/2013 0922   ALT 18 10/16/2006  0420   BILITOT <0.20 03/15/2013 0922   BILITOT 0.8 10/16/2006 0420       RADIOGRAPHIC STUDIES:  Ct Chest W Contrast  03/19/2013   CLINICAL DATA:  Lung cancer, chemotherapy and progress.  Nausea.  EXAM: CT CHEST WITH CONTRAST  TECHNIQUE: Multidetector CT imaging of the chest was performed during intravenous contrast administration.  CONTRAST:  80mL OMNIPAQUE IOHEXOL 300 MG/ML  SOLN  COMPARISON:  Multiple exams, including 02/05/2013  FINDINGS: Consolidated right upper lobe observed with scattered small areas of aeration. Right upper and middle lobe bronchi are attenuated and partially occluded but no longer as completely occluded. Small right pleural effusion is reduced in size. Small caliber of the right upper lobe pulmonary arterial branches could be due to a combination of extrinsic compression and lack of aeration causing vasoconstriction.  Significant portions of the consolidated right upper lobe may contain tumor. A right upper paratracheal  node on image 15 of series 2 has a short axis diameter 2.8 cm (formerly 3.0 cm). An upper paratracheal node on image 10 of series 2 has a short axis diameter 1.3 cm (formerly 1.6 cm). An anterior mediastinal lymph node on image 21 of series 2 has a short axis diameter of 1.8 cm (formerly 2.0 cm). Partially calcified subcarinal node short axis diameter 2.7 cm (formerly 3.0 cm).  Emphysema noted. There is scattered tiny nodules in the lungs, similar to previous but subjectively slightly is decreased. One of the larger is a 3 mm nodule on image 22 of series 2 (previously 4 mm).  Atherosclerotic aortic arch. Peripancreatic lymph node 1.3 cm, previously 1.4 cm, but not discernibly hypermetabolic previously.  There is dense coronary artery atherosclerotic calcification.  IMPRESSION: 1. Subjective and objective assessment indicates mild improvement in the size of the thoracic adenopathy, the patency of the right upper and middle lobe tracheobronchial tree, and in the size  of the right pleural effusion. That said, there is still considerable adenopathy, near complete consolidation of the right upper lobe, and a small to moderate right pleural effusion. The consolidation in the right upper and middle lobes likely obscures some residual tumor. 2. Coronary atherosclerosis. 3. Emphysema. 4. Scattered tiny nodules in the lungs, similar to prior but subjectively mildly decreased in size/prominence. Continued observation warranted. 5. Similar size of a mildly enlarged peripancreatic lymph node, not previously hypermetabolic.   Electronically Signed   By: Herbie Baltimore M.D.   On: 03/19/2013 09:57    ASSESSMENT/PLAN: This is a very pleasant 57 years old white female th limited stage small cell lung cancer currently undergoing systemic chemotherapy with cisplatin and etoposide status post 2 cycles with partial improvement in her disease. I discussed the scan results with the patient and her daughter today.  I recommended for her to continue with systemic chemotherapy with the same regimen cisplatin 60 mg/M2 on day 1 and etoposide at 120 mg/M2 on days 1, 2 and 3 with Neulasta support on day 4.  I also recommended for the patient to start concurrent radiotherapy in [place] soon. She will contact her radiation oncologist to start treatment. She will come back for followup visit in 3 weeks with the next cycle of her chemotherapy.  She was advised to call immediately if she has any concerning symptoms in the interval.  All questions were answered. The patient knows to call the clinic with any problems, questions or concerns. We can certainly see the patient much sooner if necessary.  I spent 15 minutes counseling the patient face to face. The total time spent in the appointment was 25 minutes.  Lajuana Matte., MD 03/22/2013

## 2013-03-22 NOTE — Patient Instructions (Signed)
Followup visit in 3 weeks with the next cycle of chemotherapy. 

## 2013-03-22 NOTE — Patient Instructions (Signed)
[facility] Discharge Instructions for Patients Receiving Chemotherapy  Today you received the following chemotherapy agents: Cisplatin & Etoposide  To help prevent nausea and vomiting after your treatment, we encourage you to take your nausea medications as directed:  Zofran 8 mg every 8 hours as needed  Phenergan 25 mg every 6 hours as needed   If you develop nausea and vomiting that is not controlled by your nausea medication, call the clinic.   BELOW ARE SYMPTOMS THAT SHOULD BE REPORTED IMMEDIATELY:  *FEVER GREATER THAN 100.5 F  *CHILLS WITH OR WITHOUT FEVER  NAUSEA AND VOMITING THAT IS NOT CONTROLLED WITH YOUR NAUSEA MEDICATION  *UNUSUAL SHORTNESS OF BREATH  *UNUSUAL BRUISING OR BLEEDING  TENDERNESS IN MOUTH AND THROAT WITH OR WITHOUT PRESENCE OF ULCERS  *URINARY PROBLEMS  *BOWEL PROBLEMS  UNUSUAL RASH Items with * indicate a potential emergency and should be followed up as soon as possible.  Feel free to call the clinic you have any questions or concerns. The clinic phone number is [phone].  Push po fluids for the next 48 hours.  It has been a pleasure to serve you today !

## 2013-03-22 NOTE — Progress Notes (Signed)
Patient reports nausea has improved, however, it is not resolved.  Patient reports increased nausea the week after chemotherapy.  She is taking her nausea medication but not maximizing dosage.  Weight has increased to 165.9 pounds on December 1.  Patient consuming increased refined carbohydrates as well as regular soft drinks.  She is concerned with increased glucose levels.  Patient also reports taste alterations.  Nutrition diagnosis: Food and nutrition related knowledge deficit continues.  Intervention: Patient educated to continue nausea medication to prevent nausea.  I have reviewed high-protein foods with patient and encouraged patient to increase proteins while decreasing refined carbohydrates.  I have again reviewed strategies for taste alterations.  Fact sheets were provided.  Questions were answered.  Teach back method used.    Monitoring, evaluation, goals: Patient will consume increased protein, and more complex carbohydrates while reducing simple carbohydrates for goal of weight maintenance and improved glycemic control.  Next visit: To be scheduled with next cycle of chemotherapy.

## 2013-03-22 NOTE — Progress Notes (Signed)
Pre CDDP urine output= 825 cc Post CDDP output = 800 cc Total = 1625 cc

## 2013-03-23 ENCOUNTER — Ambulatory Visit (HOSPITAL_BASED_OUTPATIENT_CLINIC_OR_DEPARTMENT_OTHER): Payer: Medicaid Other

## 2013-03-23 VITALS — BP 157/87 | HR 87 | Temp 97.3°F | Resp 18

## 2013-03-23 DIAGNOSIS — C3491 Malignant neoplasm of unspecified part of right bronchus or lung: Secondary | ICD-10-CM

## 2013-03-23 DIAGNOSIS — Z5111 Encounter for antineoplastic chemotherapy: Secondary | ICD-10-CM

## 2013-03-23 DIAGNOSIS — C3410 Malignant neoplasm of upper lobe, unspecified bronchus or lung: Secondary | ICD-10-CM

## 2013-03-23 MED ORDER — DEXAMETHASONE SODIUM PHOSPHATE 10 MG/ML IJ SOLN
INTRAMUSCULAR | Status: AC
  Filled 2013-03-23: qty 1

## 2013-03-23 MED ORDER — LORAZEPAM 2 MG/ML IJ SOLN
0.5000 mg | Freq: Once | INTRAMUSCULAR | Status: AC
  Administered 2013-03-23: 0.5 mg via INTRAVENOUS

## 2013-03-23 MED ORDER — SODIUM CHLORIDE 0.9 % IV SOLN
120.0000 mg/m2 | Freq: Once | INTRAVENOUS | Status: AC
  Administered 2013-03-23: 220 mg via INTRAVENOUS
  Filled 2013-03-23: qty 11

## 2013-03-23 MED ORDER — DEXAMETHASONE SODIUM PHOSPHATE 10 MG/ML IJ SOLN
10.0000 mg | Freq: Once | INTRAMUSCULAR | Status: AC
  Administered 2013-03-23: 10 mg via INTRAVENOUS

## 2013-03-23 MED ORDER — SODIUM CHLORIDE 0.9 % IV SOLN
Freq: Once | INTRAVENOUS | Status: AC
  Administered 2013-03-23: 10:00:00 via INTRAVENOUS

## 2013-03-23 MED ORDER — LORAZEPAM 2 MG/ML IJ SOLN
INTRAMUSCULAR | Status: AC
  Filled 2013-03-23: qty 1

## 2013-03-23 NOTE — Patient Instructions (Addendum)
[facility] Discharge Instructions for Patients Receiving Chemotherapy  Today you received the following chemotherapy agents etoposide or VP-16.  To help prevent nausea and vomiting after your treatment, we encourage you to take your nausea medication phenergan.  We gave you some ativan today at the end of your treatment for nausea.  This medication may make you drowsy.  Do not drive.  Continue with the phenergan at home on its regular schedule. If you develop nausea and vomiting that is not controlled by your nausea medication, call the clinic.   BELOW ARE SYMPTOMS THAT SHOULD BE REPORTED IMMEDIATELY:  *FEVER GREATER THAN 100.5 F  *CHILLS WITH OR WITHOUT FEVER  NAUSEA AND VOMITING THAT IS NOT CONTROLLED WITH YOUR NAUSEA MEDICATION  *UNUSUAL SHORTNESS OF BREATH  *UNUSUAL BRUISING OR BLEEDING  TENDERNESS IN MOUTH AND THROAT WITH OR WITHOUT PRESENCE OF ULCERS  *URINARY PROBLEMS  *BOWEL PROBLEMS  UNUSUAL RASH Items with * indicate a potential emergency and should be followed up as soon as possible.  Feel free to call the clinic you have any questions or concerns. The clinic phone number is [phone].

## 2013-03-23 NOTE — Progress Notes (Signed)
Patient discharged via wheelchair with friend.  Patient and friend understand that patient may not drive due to ativan may make her sleepy.

## 2013-03-24 ENCOUNTER — Ambulatory Visit (HOSPITAL_BASED_OUTPATIENT_CLINIC_OR_DEPARTMENT_OTHER): Payer: Medicaid Other

## 2013-03-24 ENCOUNTER — Telehealth: Payer: Self-pay | Admitting: Internal Medicine

## 2013-03-24 VITALS — BP 123/76 | HR 76 | Temp 97.4°F

## 2013-03-24 DIAGNOSIS — C3491 Malignant neoplasm of unspecified part of right bronchus or lung: Secondary | ICD-10-CM

## 2013-03-24 DIAGNOSIS — C3410 Malignant neoplasm of upper lobe, unspecified bronchus or lung: Secondary | ICD-10-CM

## 2013-03-24 DIAGNOSIS — Z5111 Encounter for antineoplastic chemotherapy: Secondary | ICD-10-CM

## 2013-03-24 MED ORDER — DEXAMETHASONE SODIUM PHOSPHATE 10 MG/ML IJ SOLN
INTRAMUSCULAR | Status: AC
  Filled 2013-03-24: qty 1

## 2013-03-24 MED ORDER — SODIUM CHLORIDE 0.9 % IV SOLN
Freq: Once | INTRAVENOUS | Status: AC
  Administered 2013-03-24: 09:00:00 via INTRAVENOUS

## 2013-03-24 MED ORDER — SODIUM CHLORIDE 0.9 % IV SOLN
120.0000 mg/m2 | Freq: Once | INTRAVENOUS | Status: AC
  Administered 2013-03-24: 220 mg via INTRAVENOUS
  Filled 2013-03-24: qty 11

## 2013-03-24 MED ORDER — DEXAMETHASONE SODIUM PHOSPHATE 10 MG/ML IJ SOLN
10.0000 mg | Freq: Once | INTRAMUSCULAR | Status: AC
  Administered 2013-03-24: 10 mg via INTRAVENOUS

## 2013-03-24 NOTE — Patient Instructions (Signed)
[facility] Discharge Instructions for Patients Receiving Chemotherapy  Today you received the following chemotherapy agents: Etoposide.  To help prevent nausea and vomiting after your treatment, we encourage you to take your nausea medication as prescribed.   If you develop nausea and vomiting that is not controlled by your nausea medication, call the clinic.   BELOW ARE SYMPTOMS THAT SHOULD BE REPORTED IMMEDIATELY:  *FEVER GREATER THAN 100.5 F  *CHILLS WITH OR WITHOUT FEVER  NAUSEA AND VOMITING THAT IS NOT CONTROLLED WITH YOUR NAUSEA MEDICATION  *UNUSUAL SHORTNESS OF BREATH  *UNUSUAL BRUISING OR BLEEDING  TENDERNESS IN MOUTH AND THROAT WITH OR WITHOUT PRESENCE OF ULCERS  *URINARY PROBLEMS  *BOWEL PROBLEMS  UNUSUAL RASH Items with * indicate a potential emergency and should be followed up as soon as possible.  Feel free to call the clinic you have any questions or concerns. The clinic phone number is [phone].    

## 2013-03-24 NOTE — Telephone Encounter (Signed)
s.w. pt and advised on extended appt....pt ok adn aware

## 2013-03-25 ENCOUNTER — Ambulatory Visit (HOSPITAL_BASED_OUTPATIENT_CLINIC_OR_DEPARTMENT_OTHER): Payer: Medicaid Other

## 2013-03-25 VITALS — BP 132/65 | HR 90 | Temp 98.4°F

## 2013-03-25 DIAGNOSIS — Z5189 Encounter for other specified aftercare: Secondary | ICD-10-CM

## 2013-03-25 DIAGNOSIS — C3491 Malignant neoplasm of unspecified part of right bronchus or lung: Secondary | ICD-10-CM

## 2013-03-25 DIAGNOSIS — C3410 Malignant neoplasm of upper lobe, unspecified bronchus or lung: Secondary | ICD-10-CM

## 2013-03-25 MED ORDER — PEGFILGRASTIM INJECTION 6 MG/0.6ML
6.0000 mg | Freq: Once | SUBCUTANEOUS | Status: AC
  Administered 2013-03-25: 6 mg via SUBCUTANEOUS
  Filled 2013-03-25: qty 0.6

## 2013-03-25 NOTE — Patient Instructions (Signed)

## 2013-03-29 ENCOUNTER — Other Ambulatory Visit (HOSPITAL_BASED_OUTPATIENT_CLINIC_OR_DEPARTMENT_OTHER): Payer: Medicaid Other

## 2013-03-29 DIAGNOSIS — C3491 Malignant neoplasm of unspecified part of right bronchus or lung: Secondary | ICD-10-CM

## 2013-03-29 DIAGNOSIS — C7A1 Malignant poorly differentiated neuroendocrine tumors: Secondary | ICD-10-CM

## 2013-03-29 LAB — COMPREHENSIVE METABOLIC PANEL (CC13)
AST: 11 U/L (ref 5–34)
Albumin: 3.6 g/dL (ref 3.5–5.0)
Alkaline Phosphatase: 137 U/L (ref 40–150)
Anion Gap: 9 mEq/L (ref 3–11)
BUN: 39.5 mg/dL — ABNORMAL HIGH (ref 7.0–26.0)
CO2: 24 mEq/L (ref 22–29)
Creatinine: 1.3 mg/dL — ABNORMAL HIGH (ref 0.6–1.1)
Glucose: 515 mg/dl — ABNORMAL HIGH (ref 70–140)
Total Bilirubin: 0.29 mg/dL (ref 0.20–1.20)

## 2013-03-29 LAB — CBC WITH DIFFERENTIAL/PLATELET
BASO%: 0.4 % (ref 0.0–2.0)
Eosinophils Absolute: 0.1 10*3/uL (ref 0.0–0.5)
HCT: 26.5 % — ABNORMAL LOW (ref 34.8–46.6)
LYMPH%: 7.4 % — ABNORMAL LOW (ref 14.0–49.7)
MCHC: 32 g/dL (ref 31.5–36.0)
MCV: 93.6 fL (ref 79.5–101.0)
MONO#: 0.2 10*3/uL (ref 0.1–0.9)
MONO%: 0.9 % (ref 0.0–14.0)
NEUT#: 15.7 10*3/uL — ABNORMAL HIGH (ref 1.5–6.5)
NEUT%: 90.9 % — ABNORMAL HIGH (ref 38.4–76.8)
Platelets: 230 10*3/uL (ref 145–400)
WBC: 17.3 10*3/uL — ABNORMAL HIGH (ref 3.9–10.3)

## 2013-03-29 LAB — MAGNESIUM (CC13): Magnesium: 1.9 mg/dl (ref 1.5–2.5)

## 2013-04-01 ENCOUNTER — Telehealth: Payer: Self-pay | Admitting: Medical Oncology

## 2013-04-01 NOTE — Telephone Encounter (Signed)
Pt wants labs doen in [place] . Per Dr Arbutus Ped it is okay. I left message with pt to call me with fax number and the location.

## 2013-04-02 ENCOUNTER — Telehealth: Payer: Self-pay | Admitting: Medical Oncology

## 2013-04-02 NOTE — Telephone Encounter (Signed)
Weekly lab order faxed to Christena Deem at [facility].

## 2013-04-05 ENCOUNTER — Other Ambulatory Visit: Payer: Self-pay

## 2013-04-06 ENCOUNTER — Other Ambulatory Visit: Payer: Self-pay | Admitting: Oncology

## 2013-04-09 ENCOUNTER — Other Ambulatory Visit: Payer: Self-pay | Admitting: *Deleted

## 2013-04-09 DIAGNOSIS — C3490 Malignant neoplasm of unspecified part of unspecified bronchus or lung: Secondary | ICD-10-CM

## 2013-04-09 MED ORDER — ONDANSETRON HCL 8 MG PO TABS: 8.0000 mg | ORAL_TABLET | Freq: Three times a day (TID) | ORAL | Status: DC | PRN

## 2013-04-12 ENCOUNTER — Other Ambulatory Visit: Payer: Self-pay

## 2013-04-19 ENCOUNTER — Other Ambulatory Visit (HOSPITAL_BASED_OUTPATIENT_CLINIC_OR_DEPARTMENT_OTHER): Payer: Medicaid Other

## 2013-04-19 ENCOUNTER — Other Ambulatory Visit: Payer: Self-pay | Admitting: Internal Medicine

## 2013-04-19 ENCOUNTER — Ambulatory Visit (HOSPITAL_BASED_OUTPATIENT_CLINIC_OR_DEPARTMENT_OTHER): Payer: Medicaid Other

## 2013-04-19 ENCOUNTER — Ambulatory Visit (HOSPITAL_BASED_OUTPATIENT_CLINIC_OR_DEPARTMENT_OTHER): Payer: Medicaid Other | Admitting: Physician Assistant

## 2013-04-19 ENCOUNTER — Other Ambulatory Visit: Payer: Self-pay

## 2013-04-19 ENCOUNTER — Ambulatory Visit: Payer: Medicaid Other | Admitting: Nutrition

## 2013-04-19 ENCOUNTER — Encounter: Payer: Self-pay | Admitting: Physician Assistant

## 2013-04-19 VITALS — BP 96/63 | HR 92 | Temp 97.1°F

## 2013-04-19 DIAGNOSIS — R1310 Dysphagia, unspecified: Secondary | ICD-10-CM

## 2013-04-19 DIAGNOSIS — C3491 Malignant neoplasm of unspecified part of right bronchus or lung: Secondary | ICD-10-CM

## 2013-04-19 DIAGNOSIS — C3410 Malignant neoplasm of upper lobe, unspecified bronchus or lung: Secondary | ICD-10-CM

## 2013-04-19 DIAGNOSIS — R110 Nausea: Secondary | ICD-10-CM

## 2013-04-19 DIAGNOSIS — Z5111 Encounter for antineoplastic chemotherapy: Secondary | ICD-10-CM

## 2013-04-19 DIAGNOSIS — D6481 Anemia due to antineoplastic chemotherapy: Secondary | ICD-10-CM

## 2013-04-19 LAB — CBC WITH DIFFERENTIAL/PLATELET
Basophils Absolute: 0.1 10*3/uL (ref 0.0–0.1)
EOS%: 1.8 % (ref 0.0–7.0)
Eosinophils Absolute: 0.2 10*3/uL (ref 0.0–0.5)
HCT: 25.6 % — ABNORMAL LOW (ref 34.8–46.6)
HGB: 8.4 g/dL — ABNORMAL LOW (ref 11.6–15.9)
LYMPH%: 6.6 % — ABNORMAL LOW (ref 14.0–49.7)
MCH: 31.7 pg (ref 25.1–34.0)
MCHC: 32.8 g/dL (ref 31.5–36.0)
MONO#: 0.7 10*3/uL (ref 0.1–0.9)
NEUT#: 8.7 10*3/uL — ABNORMAL HIGH (ref 1.5–6.5)
RDW: 20.2 % — ABNORMAL HIGH (ref 11.2–14.5)
WBC: 10.3 10*3/uL (ref 3.9–10.3)
lymph#: 0.7 10*3/uL — ABNORMAL LOW (ref 0.9–3.3)

## 2013-04-19 LAB — COMPREHENSIVE METABOLIC PANEL (CC13)
Albumin: 3.7 g/dL (ref 3.5–5.0)
Alkaline Phosphatase: 61 U/L (ref 40–150)
BUN: 29 mg/dL — ABNORMAL HIGH (ref 7.0–26.0)
Calcium: 9.5 mg/dL (ref 8.4–10.4)
Chloride: 107 mEq/L (ref 98–109)
Glucose: 197 mg/dl — ABNORMAL HIGH (ref 70–140)
Potassium: 4.9 mEq/L (ref 3.5–5.1)
Sodium: 140 mEq/L (ref 136–145)
Total Protein: 7.3 g/dL (ref 6.4–8.3)

## 2013-04-19 LAB — MAGNESIUM (CC13): Magnesium: 1.6 mg/dl (ref 1.5–2.5)

## 2013-04-19 MED ORDER — SODIUM CHLORIDE 0.9 % IV SOLN
120.0000 mg/m2 | Freq: Once | INTRAVENOUS | Status: AC
  Administered 2013-04-19: 220 mg via INTRAVENOUS
  Filled 2013-04-19: qty 11

## 2013-04-19 MED ORDER — DEXAMETHASONE SODIUM PHOSPHATE 20 MG/5ML IJ SOLN
12.0000 mg | Freq: Once | INTRAMUSCULAR | Status: AC
  Administered 2013-04-19: 12 mg via INTRAVENOUS

## 2013-04-19 MED ORDER — SODIUM CHLORIDE 0.9 % IV SOLN
150.0000 mg | Freq: Once | INTRAVENOUS | Status: AC
  Administered 2013-04-19: 150 mg via INTRAVENOUS
  Filled 2013-04-19: qty 5

## 2013-04-19 MED ORDER — SODIUM CHLORIDE 0.9 % IV SOLN
Freq: Once | INTRAVENOUS | Status: AC
  Administered 2013-04-19: 11:00:00 via INTRAVENOUS

## 2013-04-19 MED ORDER — MANNITOL 25 % IV SOLN
Freq: Once | INTRAVENOUS | Status: AC
  Administered 2013-04-19: 11:00:00 via INTRAVENOUS
  Filled 2013-04-19: qty 10

## 2013-04-19 MED ORDER — DEXAMETHASONE SODIUM PHOSPHATE 20 MG/5ML IJ SOLN
INTRAMUSCULAR | Status: AC
  Filled 2013-04-19: qty 5

## 2013-04-19 MED ORDER — PALONOSETRON HCL INJECTION 0.25 MG/5ML
0.2500 mg | Freq: Once | INTRAVENOUS | Status: AC
  Administered 2013-04-19: 0.25 mg via INTRAVENOUS

## 2013-04-19 MED ORDER — SODIUM CHLORIDE 0.9 % IV SOLN
49.0000 mg/m2 | Freq: Once | INTRAVENOUS | Status: AC
  Administered 2013-04-19: 90 mg via INTRAVENOUS
  Filled 2013-04-19: qty 90

## 2013-04-19 MED ORDER — PALONOSETRON HCL INJECTION 0.25 MG/5ML
INTRAVENOUS | Status: AC
  Filled 2013-04-19: qty 5

## 2013-04-19 MED ORDER — LORAZEPAM 0.5 MG PO TABS: ORAL_TABLET | ORAL | Status: DC

## 2013-04-19 NOTE — Patient Instructions (Signed)
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
## 2013-04-19 NOTE — Progress Notes (Signed)
Nutrition followup completed with patient diagnosed with small cell lung cancer receiving chemotherapy.  Patient has not had a weight documented recently.  She states she was 161 pounds when weighed at [facility].  She complains of nausea and vomiting the second week after chemotherapy.  She does have a sore throat, which makes it difficult for her to eat.  She reports no bowel movement for the past week.  She does not take stool softeners.  She has been trying to increase her nausea medications.  She states she has nothing for pain. Describes throat pain as "fire".  Nutrition diagnosis: Food and nutrition related knowledge deficit continues.  Intervention: Patient educated to continue medications as prescribed by Physician to prevent nausea.  I've recommended patient continue high-protein foods frequently.  Educated on soft, bland foods for easier swallowing.  Patient educated to contact physician regarding medications for nausea, pain, and constipation as needed.  Monitoring, evaluation, goals: Patient will tolerate adequate calories and protein for minimal weight loss and improve nutrition impact symptoms.  Next visit: I will continue to work with patient needed. No follow up scheduled at this time.  She has my contact information.

## 2013-04-20 ENCOUNTER — Telehealth: Payer: Self-pay | Admitting: Internal Medicine

## 2013-04-20 ENCOUNTER — Telehealth: Payer: Self-pay | Admitting: *Deleted

## 2013-04-20 ENCOUNTER — Ambulatory Visit (HOSPITAL_BASED_OUTPATIENT_CLINIC_OR_DEPARTMENT_OTHER): Payer: Medicaid Other

## 2013-04-20 ENCOUNTER — Other Ambulatory Visit: Payer: Self-pay | Admitting: *Deleted

## 2013-04-20 VITALS — BP 129/76 | HR 81 | Temp 97.2°F

## 2013-04-20 DIAGNOSIS — C3410 Malignant neoplasm of upper lobe, unspecified bronchus or lung: Secondary | ICD-10-CM

## 2013-04-20 DIAGNOSIS — Z5111 Encounter for antineoplastic chemotherapy: Secondary | ICD-10-CM

## 2013-04-20 DIAGNOSIS — C3491 Malignant neoplasm of unspecified part of right bronchus or lung: Secondary | ICD-10-CM

## 2013-04-20 DIAGNOSIS — C3490 Malignant neoplasm of unspecified part of unspecified bronchus or lung: Secondary | ICD-10-CM

## 2013-04-20 MED ORDER — DEXAMETHASONE SODIUM PHOSPHATE 10 MG/ML IJ SOLN
10.0000 mg | Freq: Once | INTRAMUSCULAR | Status: AC
  Administered 2013-04-20: 10 mg via INTRAVENOUS

## 2013-04-20 MED ORDER — SODIUM CHLORIDE 0.9 % IV SOLN
120.0000 mg/m2 | Freq: Once | INTRAVENOUS | Status: AC
  Administered 2013-04-20: 220 mg via INTRAVENOUS
  Filled 2013-04-20: qty 11

## 2013-04-20 MED ORDER — SODIUM CHLORIDE 0.9 % IV SOLN
Freq: Once | INTRAVENOUS | Status: AC
  Administered 2013-04-20: 13:00:00 via INTRAVENOUS

## 2013-04-20 MED ORDER — LORAZEPAM 2 MG/ML IJ SOLN
0.5000 mg | Freq: Once | INTRAMUSCULAR | Status: AC
  Administered 2013-04-20: 0.5 mg via INTRAVENOUS

## 2013-04-20 MED ORDER — DEXAMETHASONE SODIUM PHOSPHATE 10 MG/ML IJ SOLN
INTRAMUSCULAR | Status: AC
  Filled 2013-04-20: qty 1

## 2013-04-20 MED ORDER — LORAZEPAM 2 MG/ML IJ SOLN
INTRAMUSCULAR | Status: AC
  Filled 2013-04-20: qty 1

## 2013-04-20 NOTE — Telephone Encounter (Signed)
s.w. pt and she will get updated sched at today visit

## 2013-04-20 NOTE — Patient Instructions (Signed)
[facility] Discharge Instructions for Patients Receiving Chemotherapy  Today you received the following chemotherapy agents: Etoposide.  To help prevent nausea and vomiting after your treatment, we encourage you to take your nausea medication as prescribed.   If you develop nausea and vomiting that is not controlled by your nausea medication, call the clinic.   BELOW ARE SYMPTOMS THAT SHOULD BE REPORTED IMMEDIATELY:  *FEVER GREATER THAN 100.5 F  *CHILLS WITH OR WITHOUT FEVER  NAUSEA AND VOMITING THAT IS NOT CONTROLLED WITH YOUR NAUSEA MEDICATION  *UNUSUAL SHORTNESS OF BREATH  *UNUSUAL BRUISING OR BLEEDING  TENDERNESS IN MOUTH AND THROAT WITH OR WITHOUT PRESENCE OF ULCERS  *URINARY PROBLEMS  *BOWEL PROBLEMS  UNUSUAL RASH Items with * indicate a potential emergency and should be followed up as soon as possible.  Feel free to call the clinic you have any questions or concerns. The clinic phone number is [phone].    

## 2013-04-20 NOTE — Telephone Encounter (Signed)
Per staff message and POF I have scheduled appts.  JMW  

## 2013-04-20 NOTE — Patient Instructions (Signed)
Continue weekly labs as scheduled Followup with Dr. Arbutus Ped in 3 weeks with a restaging CT scan of the chest to reevaluate your disease Take the Ativan as prescribed for nausea uncontrolled by either your Phenergan or Zofran

## 2013-04-21 ENCOUNTER — Ambulatory Visit (HOSPITAL_BASED_OUTPATIENT_CLINIC_OR_DEPARTMENT_OTHER): Payer: Medicaid Other

## 2013-04-21 VITALS — BP 123/77 | HR 84 | Temp 97.8°F | Resp 20

## 2013-04-21 DIAGNOSIS — Z5111 Encounter for antineoplastic chemotherapy: Secondary | ICD-10-CM

## 2013-04-21 DIAGNOSIS — C3410 Malignant neoplasm of upper lobe, unspecified bronchus or lung: Secondary | ICD-10-CM

## 2013-04-21 DIAGNOSIS — C3491 Malignant neoplasm of unspecified part of right bronchus or lung: Secondary | ICD-10-CM

## 2013-04-21 MED ORDER — DEXAMETHASONE SODIUM PHOSPHATE 10 MG/ML IJ SOLN
10.0000 mg | Freq: Once | INTRAMUSCULAR | Status: AC
  Administered 2013-04-21: 10 mg via INTRAVENOUS

## 2013-04-21 MED ORDER — DEXAMETHASONE SODIUM PHOSPHATE 10 MG/ML IJ SOLN
INTRAMUSCULAR | Status: AC
  Filled 2013-04-21: qty 1

## 2013-04-21 MED ORDER — HEPARIN SOD (PORK) LOCK FLUSH 100 UNIT/ML IV SOLN
500.0000 [IU] | Freq: Once | INTRAVENOUS | Status: DC | PRN
  Filled 2013-04-21: qty 5

## 2013-04-21 MED ORDER — SODIUM CHLORIDE 0.9 % IV SOLN
120.0000 mg/m2 | Freq: Once | INTRAVENOUS | Status: AC
  Administered 2013-04-21: 220 mg via INTRAVENOUS
  Filled 2013-04-21: qty 11

## 2013-04-21 MED ORDER — SODIUM CHLORIDE 0.9 % IV SOLN
Freq: Once | INTRAVENOUS | Status: AC
  Administered 2013-04-21: 12:00:00 via INTRAVENOUS

## 2013-04-21 MED ORDER — SODIUM CHLORIDE 0.9 % IJ SOLN
10.0000 mL | INTRAMUSCULAR | Status: DC | PRN
  Filled 2013-04-21: qty 10

## 2013-04-21 NOTE — Patient Instructions (Signed)
[facility] Discharge Instructions for Patients Receiving Chemotherapy  Today you received the following chemotherapy agents: Etoposide.  To help prevent nausea and vomiting after your treatment, we encourage you to take your nausea medication as prescribed.   If you develop nausea and vomiting that is not controlled by your nausea medication, call the clinic.   BELOW ARE SYMPTOMS THAT SHOULD BE REPORTED IMMEDIATELY:  *FEVER GREATER THAN 100.5 F  *CHILLS WITH OR WITHOUT FEVER  NAUSEA AND VOMITING THAT IS NOT CONTROLLED WITH YOUR NAUSEA MEDICATION  *UNUSUAL SHORTNESS OF BREATH  *UNUSUAL BRUISING OR BLEEDING  TENDERNESS IN MOUTH AND THROAT WITH OR WITHOUT PRESENCE OF ULCERS  *URINARY PROBLEMS  *BOWEL PROBLEMS  UNUSUAL RASH Items with * indicate a potential emergency and should be followed up as soon as possible.  Feel free to call the clinic you have any questions or concerns. The clinic phone number is [phone].    

## 2013-04-23 ENCOUNTER — Ambulatory Visit (HOSPITAL_BASED_OUTPATIENT_CLINIC_OR_DEPARTMENT_OTHER): Payer: Medicaid Other

## 2013-04-23 ENCOUNTER — Telehealth: Payer: Self-pay | Admitting: Internal Medicine

## 2013-04-23 VITALS — BP 115/56 | HR 83 | Temp 98.9°F

## 2013-04-23 DIAGNOSIS — C3410 Malignant neoplasm of upper lobe, unspecified bronchus or lung: Secondary | ICD-10-CM

## 2013-04-23 DIAGNOSIS — Z5189 Encounter for other specified aftercare: Secondary | ICD-10-CM

## 2013-04-23 DIAGNOSIS — C3491 Malignant neoplasm of unspecified part of right bronchus or lung: Secondary | ICD-10-CM

## 2013-04-23 MED ORDER — PEGFILGRASTIM INJECTION 6 MG/0.6ML
6.0000 mg | Freq: Once | SUBCUTANEOUS | Status: AC
  Administered 2013-04-23: 6 mg via SUBCUTANEOUS
  Filled 2013-04-23: qty 0.6

## 2013-04-23 NOTE — Telephone Encounter (Signed)
pt walked in needing some appt times adjusted due to having labs and taking radiation in [place]  [place] certain days before coming here for appts This was done shh

## 2013-04-26 ENCOUNTER — Other Ambulatory Visit: Payer: Medicaid Other

## 2013-05-03 ENCOUNTER — Other Ambulatory Visit: Payer: Medicaid Other

## 2013-05-04 ENCOUNTER — Telehealth: Payer: Self-pay | Admitting: *Deleted

## 2013-05-04 NOTE — Telephone Encounter (Signed)
Late entry 05/03/13--received lab work from [employer], hbg 7.8.  Called and left msg to call back regarding setting up a blood transfusion.  SLJ

## 2013-05-05 ENCOUNTER — Telehealth: Payer: Self-pay | Admitting: *Deleted

## 2013-05-05 NOTE — Telephone Encounter (Signed)
Pt called back, informed her about her low hbg 7.8.  Explained to patient about a possible blood transfusion.  She said she is headed out the door to [place] to get her radiation and she will think about it and call back.  SLJ

## 2013-05-05 NOTE — Telephone Encounter (Signed)
erroneous

## 2013-05-05 NOTE — Telephone Encounter (Signed)
Spoke with patient.  She is currently asymptomatic.  She is currently recovering from what she thinks was the flu. She states she is feeling better.  Will recheck her labs on Monday 05/10/13 when she returns to see if she is still anemic.  Pt instructed to call sooner if she does develop increased fatigue or SOB.  SLJ

## 2013-05-07 ENCOUNTER — Encounter (HOSPITAL_COMMUNITY): Payer: Self-pay

## 2013-05-07 ENCOUNTER — Ambulatory Visit (HOSPITAL_COMMUNITY)
Admission: RE | Admit: 2013-05-07 | Discharge: 2013-05-07 | Disposition: A | Discharge status: Home / Self Care | Payer: Medicaid Other | Source: Ambulatory Visit | Attending: Physician Assistant | Admitting: Physician Assistant

## 2013-05-07 DIAGNOSIS — C7A1 Malignant poorly differentiated neuroendocrine tumors: Secondary | ICD-10-CM | POA: Insufficient documentation

## 2013-05-07 DIAGNOSIS — R911 Solitary pulmonary nodule: Secondary | ICD-10-CM | POA: Insufficient documentation

## 2013-05-07 DIAGNOSIS — J438 Other emphysema: Secondary | ICD-10-CM | POA: Insufficient documentation

## 2013-05-07 DIAGNOSIS — K228 Other specified diseases of esophagus: Secondary | ICD-10-CM | POA: Insufficient documentation

## 2013-05-07 DIAGNOSIS — R599 Enlarged lymph nodes, unspecified: Secondary | ICD-10-CM | POA: Insufficient documentation

## 2013-05-07 DIAGNOSIS — C3490 Malignant neoplasm of unspecified part of unspecified bronchus or lung: Secondary | ICD-10-CM

## 2013-05-07 DIAGNOSIS — K2289 Other specified disease of esophagus: Secondary | ICD-10-CM | POA: Insufficient documentation

## 2013-05-07 DIAGNOSIS — K7689 Other specified diseases of liver: Secondary | ICD-10-CM | POA: Insufficient documentation

## 2013-05-07 DIAGNOSIS — I2510 Atherosclerotic heart disease of native coronary artery without angina pectoris: Secondary | ICD-10-CM | POA: Insufficient documentation

## 2013-05-07 DIAGNOSIS — K5710 Diverticulosis of small intestine without perforation or abscess without bleeding: Secondary | ICD-10-CM | POA: Insufficient documentation

## 2013-05-07 HISTORY — DX: Malignant (primary) neoplasm, unspecified: C80.1

## 2013-05-07 MED ORDER — IOHEXOL 300 MG/ML  SOLN
80.0000 mL | Freq: Once | INTRAMUSCULAR | Status: AC | PRN
  Administered 2013-05-07: 80 mL via INTRAVENOUS

## 2013-05-10 ENCOUNTER — Ambulatory Visit: Payer: Medicaid Other | Admitting: Internal Medicine

## 2013-05-10 ENCOUNTER — Ambulatory Visit (HOSPITAL_BASED_OUTPATIENT_CLINIC_OR_DEPARTMENT_OTHER): Payer: Medicaid Other | Admitting: Internal Medicine

## 2013-05-10 ENCOUNTER — Other Ambulatory Visit (HOSPITAL_BASED_OUTPATIENT_CLINIC_OR_DEPARTMENT_OTHER): Payer: Medicaid Other

## 2013-05-10 ENCOUNTER — Ambulatory Visit (HOSPITAL_BASED_OUTPATIENT_CLINIC_OR_DEPARTMENT_OTHER): Payer: Medicaid Other

## 2013-05-10 ENCOUNTER — Other Ambulatory Visit: Payer: Medicaid Other

## 2013-05-10 ENCOUNTER — Encounter: Payer: Self-pay | Admitting: Internal Medicine

## 2013-05-10 ENCOUNTER — Other Ambulatory Visit: Payer: Self-pay | Admitting: Medical Oncology

## 2013-05-10 ENCOUNTER — Telehealth: Payer: Self-pay | Admitting: Medical Oncology

## 2013-05-10 ENCOUNTER — Ambulatory Visit (HOSPITAL_COMMUNITY)
Admission: RE | Admit: 2013-05-10 | Discharge: 2013-05-10 | Disposition: A | Discharge status: Home / Self Care | Payer: Medicaid Other | Source: Ambulatory Visit | Attending: Internal Medicine | Admitting: Internal Medicine

## 2013-05-10 ENCOUNTER — Telehealth: Payer: Self-pay | Admitting: Internal Medicine

## 2013-05-10 VITALS — BP 125/56 | HR 119 | Temp 98.0°F | Resp 18 | Ht 66.0 in | Wt 157.8 lb

## 2013-05-10 DIAGNOSIS — D649 Anemia, unspecified: Secondary | ICD-10-CM | POA: Insufficient documentation

## 2013-05-10 DIAGNOSIS — C3490 Malignant neoplasm of unspecified part of unspecified bronchus or lung: Secondary | ICD-10-CM

## 2013-05-10 DIAGNOSIS — Z5111 Encounter for antineoplastic chemotherapy: Secondary | ICD-10-CM

## 2013-05-10 DIAGNOSIS — D6481 Anemia due to antineoplastic chemotherapy: Secondary | ICD-10-CM

## 2013-05-10 DIAGNOSIS — C3491 Malignant neoplasm of unspecified part of right bronchus or lung: Secondary | ICD-10-CM

## 2013-05-10 DIAGNOSIS — T451X5A Adverse effect of antineoplastic and immunosuppressive drugs, initial encounter: Secondary | ICD-10-CM

## 2013-05-10 DIAGNOSIS — C7A1 Malignant poorly differentiated neuroendocrine tumors: Secondary | ICD-10-CM

## 2013-05-10 LAB — CBC WITH DIFFERENTIAL/PLATELET
BASO%: 0.4 % (ref 0.0–2.0)
Basophils Absolute: 0.1 10*3/uL (ref 0.0–0.1)
EOS%: 1.8 % (ref 0.0–7.0)
Eosinophils Absolute: 0.3 10*3/uL (ref 0.0–0.5)
HEMATOCRIT: 22 % — AB (ref 34.8–46.6)
HGB: 7.4 g/dL — ABNORMAL LOW (ref 11.6–15.9)
LYMPH%: 4.5 % — ABNORMAL LOW (ref 14.0–49.7)
MCH: 33.3 pg (ref 25.1–34.0)
MCHC: 33.6 g/dL (ref 31.5–36.0)
MCV: 99.1 fL (ref 79.5–101.0)
MONO#: 0.8 10*3/uL (ref 0.1–0.9)
MONO%: 5.8 % (ref 0.0–14.0)
NEUT#: 12.1 10*3/uL — ABNORMAL HIGH (ref 1.5–6.5)
NEUT%: 87.5 % — ABNORMAL HIGH (ref 38.4–76.8)
Platelets: 305 10*3/uL (ref 145–400)
RBC: 2.22 10*6/uL — ABNORMAL LOW (ref 3.70–5.45)
RDW: 17.5 % — ABNORMAL HIGH (ref 11.2–14.5)
WBC: 13.9 10*3/uL — ABNORMAL HIGH (ref 3.9–10.3)
lymph#: 0.6 10*3/uL — ABNORMAL LOW (ref 0.9–3.3)
nRBC: 0 % (ref 0–0)

## 2013-05-10 LAB — COMPREHENSIVE METABOLIC PANEL (CC13)
ALT: 9 U/L (ref 0–55)
AST: 11 U/L (ref 5–34)
Albumin: 3.2 g/dL — ABNORMAL LOW (ref 3.5–5.0)
Alkaline Phosphatase: 76 U/L (ref 40–150)
Anion Gap: 12 mEq/L — ABNORMAL HIGH (ref 3–11)
BUN: 12.6 mg/dL (ref 7.0–26.0)
CALCIUM: 9.3 mg/dL (ref 8.4–10.4)
CO2: 21 mEq/L — ABNORMAL LOW (ref 22–29)
CREATININE: 1.1 mg/dL (ref 0.6–1.1)
Chloride: 104 mEq/L (ref 98–109)
Glucose: 213 mg/dl — ABNORMAL HIGH (ref 70–140)
Potassium: 4 mEq/L (ref 3.5–5.1)
Sodium: 138 mEq/L (ref 136–145)
Total Bilirubin: 0.23 mg/dL (ref 0.20–1.20)
Total Protein: 7.4 g/dL (ref 6.4–8.3)

## 2013-05-10 LAB — ABO/RH: ABO/RH(D): O POS

## 2013-05-10 LAB — MAGNESIUM (CC13): MAGNESIUM: 1.4 mg/dL — AB (ref 1.5–2.5)

## 2013-05-10 MED ORDER — MAGNESIUM OXIDE 400 (241.3 MG) MG PO TABS: 400.0000 mg | ORAL_TABLET | Freq: Three times a day (TID) | ORAL | Status: DC

## 2013-05-10 MED ORDER — DEXAMETHASONE SODIUM PHOSPHATE 20 MG/5ML IJ SOLN
INTRAMUSCULAR | Status: AC
  Filled 2013-05-10: qty 5

## 2013-05-10 MED ORDER — SODIUM CHLORIDE 0.9 % IV SOLN
150.0000 mg | Freq: Once | INTRAVENOUS | Status: AC
  Administered 2013-05-10: 150 mg via INTRAVENOUS
  Filled 2013-05-10: qty 5

## 2013-05-10 MED ORDER — SODIUM CHLORIDE 0.9 % IV SOLN
120.0000 mg/m2 | Freq: Once | INTRAVENOUS | Status: AC
  Administered 2013-05-10: 220 mg via INTRAVENOUS
  Filled 2013-05-10: qty 11

## 2013-05-10 MED ORDER — POTASSIUM CHLORIDE 2 MEQ/ML IV SOLN
Freq: Once | INTRAVENOUS | Status: AC
  Administered 2013-05-10: 11:00:00 via INTRAVENOUS
  Filled 2013-05-10: qty 10

## 2013-05-10 MED ORDER — DEXAMETHASONE SODIUM PHOSPHATE 20 MG/5ML IJ SOLN
12.0000 mg | Freq: Once | INTRAMUSCULAR | Status: AC
  Administered 2013-05-10: 12 mg via INTRAVENOUS

## 2013-05-10 MED ORDER — PALONOSETRON HCL INJECTION 0.25 MG/5ML
INTRAVENOUS | Status: AC
  Filled 2013-05-10: qty 5

## 2013-05-10 MED ORDER — CISPLATIN CHEMO INJECTION 100MG/100ML
60.0000 mg/m2 | Freq: Once | INTRAVENOUS | Status: AC
  Administered 2013-05-10: 110 mg via INTRAVENOUS
  Filled 2013-05-10: qty 110

## 2013-05-10 MED ORDER — PALONOSETRON HCL INJECTION 0.25 MG/5ML
0.2500 mg | Freq: Once | INTRAVENOUS | Status: AC
  Administered 2013-05-10: 0.25 mg via INTRAVENOUS

## 2013-05-10 MED ORDER — SODIUM CHLORIDE 0.9 % IV SOLN
Freq: Once | INTRAVENOUS | Status: AC
  Administered 2013-05-10: 11:00:00 via INTRAVENOUS

## 2013-05-10 NOTE — Patient Instructions (Signed)
Continue systemic chemotherapy today as scheduled. Followup visit in 3 weeks with the next cycle of her treatment.

## 2013-05-10 NOTE — Telephone Encounter (Signed)
Called to to pt/ and pharmacy.

## 2013-05-10 NOTE — Patient Instructions (Signed)
[facility] Discharge Instructions for Patients Receiving Chemotherapy  Today you received the following chemotherapy agents Cisplatin and VP-16 (Etoposide).  To help prevent nausea and vomiting after your treatment, we encourage you to take your nausea medication.   If you develop nausea and vomiting that is not controlled by your nausea medication, call the clinic.   BELOW ARE SYMPTOMS THAT SHOULD BE REPORTED IMMEDIATELY:  *FEVER GREATER THAN 100.5 F  *CHILLS WITH OR WITHOUT FEVER  NAUSEA AND VOMITING THAT IS NOT CONTROLLED WITH YOUR NAUSEA MEDICATION  *UNUSUAL SHORTNESS OF BREATH  *UNUSUAL BRUISING OR BLEEDING  TENDERNESS IN MOUTH AND THROAT WITH OR WITHOUT PRESENCE OF ULCERS  *URINARY PROBLEMS  *BOWEL PROBLEMS  UNUSUAL RASH Items with * indicate a potential emergency and should be followed up as soon as possible.  Feel free to call the clinic you have any questions or concerns. The clinic phone number is [phone].

## 2013-05-10 NOTE — Telephone Encounter (Signed)
Message copied by Ardeen Garland on Mon May 10, 2013  4:38 PM ------      Message from: Curt Bears      Created: Mon May 10, 2013  1:15 PM       Call patient with the result and start mg oxide 400 mg po tid #90 ------

## 2013-05-10 NOTE — Telephone Encounter (Signed)
Pt will be scheduled for blood by Lenna Sciara for tomorrow per Burman Nieves, emailed Sharyn Lull regarding chemo pt will come and get appt calendar today

## 2013-05-10 NOTE — Telephone Encounter (Signed)
Med reconcilliation

## 2013-05-10 NOTE — Progress Notes (Signed)
har called for

## 2013-05-10 NOTE — Progress Notes (Signed)
[place]  [phone] [phone]   OFFICE PROGRESS NOTE  No PCP Per Patient [place] [id number]  DIAGNOSIS:    Limited stage small cell lung cancer diagnosed in October 2014.  PRIOR THERAPY: None  CURRENT THERAPY: Systemic chemotherapy with cisplatin at 60 mg per meter square given on day 1 and etoposide at 120 mg per meter squared given on days 1, 2 and 3 with Neulasta support given on day 4 every 3 weeks. A total 4-6 cycles are planned depending on response. Status post 4 cycles.  DISEASE STAGE: Limited stage  CHEMOTHERAPY INTENT: Control  CURRENT # OF CHEMOTHERAPY CYCLES: 4  CURRENT ANTIEMETICS: Compazine, Phenergan, dexamethasone, Zofran, Aloxi, Emend  CURRENT SMOKING STATUS: Former smoker, quit 01/30/2013  ORAL CHEMOTHERAPY AND CONSENT: n/a  CURRENT BISPHOSPHONATES USE: none  PAIN MANAGEMENT: none  NARCOTICS INDUCED CONSTIPATION: None  LIVING WILL AND CODE STATUS: Full Code   INTERVAL HISTORY: Antha Niday 58 y.o. female returns for a scheduled follow up visit accompanied by her daughter. She tolerated the fourth cycle of her chemotherapy fairly well with no significant complaints except for mild fatigue secondary to chemotherapy-induced anemia. She is also undergoing concurrent radiotherapy in [facility] and she is expected to finish this course on 05/19/2013. She denied having any significant fever or chills. She has no nausea or vomiting. The patient denied having any significant chest pain but continues to have shortness of breath with exertion was no cough or hemoptysis. She had repeat CT scan of the chest performed recently and she is here for evaluation and discussion of her scan results.  MEDICAL HISTORY: Past Medical History  Diagnosis Date  . Tobacco abuse   . Diabetes mellitus   . Mitral regurgitation   . Hypertension   . Dyslipidemia   . Coronary artery disease   . History of  echocardiogram 7/10    EF 50-55%; inferior hypokinesis; RV function mild decreased  . ACE-inhibitor cough   . Multiple allergies     Penicillin and mushrooms  . Cancer     lung ca    ALLERGIES:  is allergic to codeine and penicillins.  MEDICATIONS:  Current Outpatient Prescriptions  Medication Sig Dispense Refill  . Alum & Mag Hydroxide-Simeth (MAGIC MOUTHWASH) SOLN Take 5 mLs by mouth 4 (four) times daily as needed for mouth pain.      Marland Kitchen aspirin 81 MG tablet Take 81 mg by mouth daily.      . B Complex-C (SUPER B COMPLEX PO) Take 1 tablet by mouth daily.      . isosorbide mononitrate (IMDUR) 30 MG 24 hr tablet Take 30 mg by mouth daily.        Marland Kitchen loratadine (CLARITIN) 10 MG tablet Take 10 mg by mouth daily.      Marland Kitchen LORazepam (ATIVAN) 0.5 MG tablet 1 tablet by mouth or sublingually every 8 hours as needed for nausea uncontrollerd by Phenergan or Zofran  30 tablet  0  . lovastatin (MEVACOR) 20 MG tablet Take 20 mg by mouth at bedtime.      . magnesium oxide (MAG-OX) 400 (241.3 MG) MG tablet Take 1 tablet (400 mg total) by mouth 3 (three) times daily.  90 tablet  0  . metFORMIN (GLUCOPHAGE) 850 MG tablet Take 850 mg by mouth 2 (two) times daily with a meal.      . metoprolol (TOPROL-XL) 50 MG 24 hr tablet Take 50 mg by mouth daily.        Marland Kitchen  Misc Natural Products (ENERGY FOCUS) TABS Take by mouth.      . ondansetron (ZOFRAN) 8 MG tablet Take 1 tablet (8 mg total) by mouth every 8 (eight) hours as needed for nausea.  30 tablet  2  . promethazine (PHENERGAN) 25 MG tablet TAKE ONE TABLET BY MOUTH EVERY 6 HOURS AS NEEDED  30 tablet  0  . sucralfate (CARAFATE) 1 GM/10ML suspension Take 1 g by mouth 4 (four) times daily -  with meals and at bedtime.      . valsartan (DIOVAN) 160 MG tablet Take 160 mg by mouth daily.        Marland Kitchen albuterol (PROVENTIL HFA;VENTOLIN HFA) 108 (90 BASE) MCG/ACT inhaler Inhale 2 puffs into the lungs every 6 (six) hours as needed for wheezing or shortness of breath.  1 Inhaler   2   No current facility-administered medications for this visit.    SURGICAL HISTORY:  Past Surgical History  Procedure Laterality Date  . Video bronchoscopy Bilateral 01/26/2013    Procedure: VIDEO BRONCHOSCOPY WITHOUT FLUORO;  Surgeon: Tanda Rockers, MD;  Location: [facility];  Service: Cardiopulmonary;  Laterality: Bilateral;    REVIEW OF SYSTEMS:  Constitutional: negative Eyes: negative Ears, nose, mouth, throat, and face: negative Respiratory: positive for dyspnea on exertion Cardiovascular: negative Gastrointestinal: negative Genitourinary:negative Integument/breast: negative Hematologic/lymphatic: negative Musculoskeletal:negative Neurological: negative Behavioral/Psych: negative   PHYSICAL EXAMINATION: General appearance: alert, cooperative, appears stated age and no distress Head: Normocephalic, without obvious abnormality, atraumatic Neck: no adenopathy, no carotid bruit, no JVD, supple, symmetrical, trachea midline and thyroid not enlarged, symmetric, no tenderness/mass/nodules Lymph nodes: Cervical, supraclavicular, and axillary nodes normal. Resp: clear to auscultation bilaterally Cardio: regular rate and rhythm, S1, S2 normal, no murmur, click, rub or gallop GI: soft, non-tender; bowel sounds normal; no masses,  no organomegaly Extremities: extremities normal, atraumatic, no cyanosis or edema Neurologic: Alert and oriented X 3, normal strength and tone. Normal symmetric reflexes. Normal coordination and gait  ECOG PERFORMANCE STATUS: 1 - Symptomatic but completely ambulatory  Blood pressure 125/56, pulse 119, temperature 98 F (36.7 C), temperature source Oral, resp. rate 18, height 5\' 6"  (1.676 m), weight 157 lb 12.8 oz (71.578 kg), SpO2 100.00%.  LABORATORY DATA: Lab Results  Component Value Date   WBC 13.9* 05/10/2013   HGB 7.4* 05/10/2013   HCT 22.0* 05/10/2013   MCV 99.1 05/10/2013   PLT 305 05/10/2013      Chemistry      Component Value Date/Time     NA 140 04/19/2013 0947   NA 138 10/16/2006 0420   K 4.9 04/19/2013 0947   K 3.8 10/16/2006 0420   CL 105 10/16/2006 0420   CO2 21* 04/19/2013 0947   CO2 27 10/16/2006 0420   BUN 29.0* 04/19/2013 0947   BUN 11 10/16/2006 0420   CREATININE 1.4* 04/19/2013 0947   CREATININE 0.63 10/16/2006 0420      Component Value Date/Time   CALCIUM 9.5 04/19/2013 0947   CALCIUM 9.2 10/16/2006 0420   ALKPHOS 61 04/19/2013 0947   ALKPHOS 64 10/16/2006 0420   AST 12 04/19/2013 0947   AST 17 10/16/2006 0420   ALT 10 04/19/2013 0947   ALT 18 10/16/2006 0420   BILITOT 0.33 04/19/2013 0947   BILITOT 0.8 10/16/2006 0420       RADIOGRAPHIC STUDIES:  Ct Chest W Contrast  05/07/2013   CLINICAL DATA:  Restaging small cell lung cancer, right side.  EXAM: CT CHEST WITH CONTRAST  TECHNIQUE: Multidetector CT imaging of  the chest was performed during intravenous contrast administration.  CONTRAST:  34mL OMNIPAQUE IOHEXOL 300 MG/ML  SOLN  COMPARISON:  CT SIM LUNG dated 04/21/2013; CT SIM LUNG dated 03/26/2013; NM PET IMAGE INITIAL (PI) SKULL BASE TO THIGH dated 02/05/2013; CT CHEST W/CM dated 03/19/2013; CT CHEST W/ CM dated 01/12/2013  FINDINGS: Right lower paratracheal node 1.5 cm in short axis on image 21 of series 2, formerly 1.9 cm. Calcifications along the inferior margin.  Calcified anterior mediastinal node 1.0 cm on image 21 of series 2, formerly 1.1 cm.  Indistinct subcarinal node short axis diameter 1.7 cm, formerly 1.8 cm. Mid esophageal wall thickening is suspected. Dense coronary artery atherosclerosis. Trace right pleural effusion, nonspecific for exudative versus transudative. Continued continued atelectasis in the right middle lobe. Tapered narrowing of right middle lobe bronchi. Stable small hypodense liver lesions. Incidental jejunal diverticulum seen on image 61 of series 2.  Right upper lobe pulmonary nodule 1.3 x 1.6 cm on image 16 of series 5, formerly 1.3 by 1.6 cm. Scattered stable interstitial  accentuation in the right lung with a somewhat reticulonodular configuration. Emphysema is present. Coronary artery atherosclerosis.  Since the previous diagnostic CT of 03/19/2013, there is patent re-expansion at resolution of the airspace opacity in the right upper lobe, although this re-expansion was also present on the 04/21/2013 radiation planning CT.  Stable mild prominence of right hilar nodal tissue with scattered calcifications.  IMPRESSION: 1. Mild improvement in mediastinal and right hilar adenopathy. The right upper lobe spiculated pulmonary nodule is stable in size compared to the radiation therapy planning CT of 04/21/2013, but was obscured by surrounding airspace opacity on the prior diagnostic CT scan of 03/19/2013. 2. Emphysema. 3. Mid esophageal wall thickening - esophagitis not excluded. 4. Coronary artery atherosclerosis. 5. Trace right pleural effusion. 6. Continued atelectasis in the right middle lobe. 7. Stable reticulonodular interstitial accentuation in the lungs. 8. Emphysema.   Electronically Signed   By: Sherryl Barters M.D.   On: 05/07/2013 11:22    ASSESSMENT/PLAN: This is a very pleasant 58 years old white female th limited stage small cell lung cancer currently undergoing systemic chemotherapy with cisplatin and etoposide status post 4 cycles concurrent with radiotherapy with continuous improvement in her disease. I discussed the scan results and showed the images to the patient and her daughter today.  I recommended for her to continue with systemic chemotherapy with the same regimen cisplatin 60 mg/M2 on day 1 and etoposide at 120 mg/M2 on days 1, 2 and 3 with Neulasta support on day 4 for 2 more cycles For the chemotherapy-induced anemia, I will arrange for the patient to receive 2 units of PRBCs transfusion. She will come back for followup visit in 3 weeks with the next cycle of her chemotherapy. She was advised to call immediately if she has any concerning symptoms in  the interval.  All questions were answered. The patient knows to call the clinic with any problems, questions or concerns. We can certainly see the patient much sooner if necessary.  I spent 15 minutes counseling the patient face to face. The total time spent in the appointment was 25 minutes.  Disclaimer: This note was dictated with voice recognition software. Similar sounding words can inadvertently be transcribed and may not be corrected upon review.   Eilleen Kempf., MD 05/10/2013

## 2013-05-10 NOTE — Progress Notes (Signed)
Quick Note:  Call patient with the result and start mg oxide 400 mg po tid #90 ______

## 2013-05-11 ENCOUNTER — Ambulatory Visit: Payer: Medicaid Other

## 2013-05-11 ENCOUNTER — Telehealth: Payer: Self-pay | Admitting: *Deleted

## 2013-05-11 ENCOUNTER — Ambulatory Visit (HOSPITAL_BASED_OUTPATIENT_CLINIC_OR_DEPARTMENT_OTHER): Payer: Medicaid Other

## 2013-05-11 VITALS — BP 126/64 | HR 91 | Temp 98.0°F | Resp 16

## 2013-05-11 DIAGNOSIS — C3490 Malignant neoplasm of unspecified part of unspecified bronchus or lung: Secondary | ICD-10-CM

## 2013-05-11 DIAGNOSIS — C3410 Malignant neoplasm of upper lobe, unspecified bronchus or lung: Secondary | ICD-10-CM

## 2013-05-11 DIAGNOSIS — D649 Anemia, unspecified: Secondary | ICD-10-CM

## 2013-05-11 DIAGNOSIS — Z5111 Encounter for antineoplastic chemotherapy: Secondary | ICD-10-CM

## 2013-05-11 LAB — PREPARE RBC (CROSSMATCH)

## 2013-05-11 MED ORDER — SODIUM CHLORIDE 0.9 % IV SOLN
250.0000 mL | Freq: Once | INTRAVENOUS | Status: AC
  Administered 2013-05-11: 250 mL via INTRAVENOUS

## 2013-05-11 MED ORDER — DEXAMETHASONE SODIUM PHOSPHATE 10 MG/ML IJ SOLN
10.0000 mg | Freq: Once | INTRAMUSCULAR | Status: AC
  Administered 2013-05-11: 10 mg via INTRAVENOUS

## 2013-05-11 MED ORDER — DEXAMETHASONE SODIUM PHOSPHATE 10 MG/ML IJ SOLN
INTRAMUSCULAR | Status: AC
  Filled 2013-05-11: qty 1

## 2013-05-11 MED ORDER — SODIUM CHLORIDE 0.9 % IV SOLN
120.0000 mg/m2 | Freq: Once | INTRAVENOUS | Status: AC
  Administered 2013-05-11: 220 mg via INTRAVENOUS
  Filled 2013-05-11: qty 11

## 2013-05-11 MED ORDER — DIPHENHYDRAMINE HCL 25 MG PO CAPS
ORAL_CAPSULE | ORAL | Status: AC
  Filled 2013-05-11: qty 1

## 2013-05-11 MED ORDER — ACETAMINOPHEN 325 MG PO TABS
ORAL_TABLET | ORAL | Status: AC
  Filled 2013-05-11: qty 2

## 2013-05-11 MED ORDER — DIPHENHYDRAMINE HCL 25 MG PO CAPS
25.0000 mg | ORAL_CAPSULE | Freq: Once | ORAL | Status: AC
  Administered 2013-05-11: 25 mg via ORAL

## 2013-05-11 MED ORDER — SODIUM CHLORIDE 0.9 % IV SOLN
Freq: Once | INTRAVENOUS | Status: AC
  Administered 2013-05-11: 15:00:00 via INTRAVENOUS

## 2013-05-11 MED ORDER — ACETAMINOPHEN 325 MG PO TABS
650.0000 mg | ORAL_TABLET | Freq: Once | ORAL | Status: AC
  Administered 2013-05-11: 650 mg via ORAL

## 2013-05-11 NOTE — Patient Instructions (Signed)
[facility] Discharge Instructions for Patients Receiving Chemotherapy  Today you received the following chemotherapy agents Etoposide.  To help prevent nausea and vomiting after your treatment, we encourage you to take your nausea medication as prescribed by Dr Julien Nordmann.   If you develop nausea and vomiting that is not controlled by your nausea medication, call the clinic.   BELOW ARE SYMPTOMS THAT SHOULD BE REPORTED IMMEDIATELY:  *FEVER GREATER THAN 100.5 F  *CHILLS WITH OR WITHOUT FEVER  NAUSEA AND VOMITING THAT IS NOT CONTROLLED WITH YOUR NAUSEA MEDICATION  *UNUSUAL SHORTNESS OF BREATH  *UNUSUAL BRUISING OR BLEEDING  TENDERNESS IN MOUTH AND THROAT WITH OR WITHOUT PRESENCE OF ULCERS  *URINARY PROBLEMS  *BOWEL PROBLEMS  UNUSUAL RASH Items with * indicate a potential emergency and should be followed up as soon as possible.  Feel free to call the clinic you have any questions or concerns. The clinic phone number is [phone].   Blood Transfusion  A blood transfusion replaces your blood or some of its parts. Blood is replaced when you have lost blood because of surgery, an accident, or for severe blood conditions like anemia. You can donate blood to be used on yourself if you have a planned surgery. If you lose blood during that surgery, your own blood can be given back to you. Any blood given to you is checked to make sure it matches your blood type. Your temperature, blood pressure, and heart rate (vital signs) will be checked often.  GET HELP RIGHT AWAY IF:   You feel sick to your stomach (nauseous) or throw up (vomit).  You have watery poop (diarrhea).  You have shortness of breath or trouble breathing.  You have blood in your pee (urine) or have dark colored pee.  You have chest pain or tightness.  Your eyes or skin turn yellow (jaundice).  You have a temperature by mouth above 102 F (38.9 C), not controlled by medicine.  You start to shake  and have chills.  You develop a a red rash (hives) or feel itchy.  You develop lightheadedness or feel confused.  You develop back, joint, or muscle pain.  You do not feel hungry (lost appetite).  You feel tired, restless, or nervous.  You develop belly (abdominal) cramps. Document Released: 07/05/2008 Document Revised: 07/01/2011 Document Reviewed: 07/05/2008 [facility] Patient Information 2014 [place], [place].

## 2013-05-11 NOTE — Telephone Encounter (Signed)
Per staff message and POF I have scheduled appts.  JMW  

## 2013-05-12 ENCOUNTER — Ambulatory Visit (HOSPITAL_BASED_OUTPATIENT_CLINIC_OR_DEPARTMENT_OTHER): Payer: Medicaid Other

## 2013-05-12 ENCOUNTER — Telehealth: Payer: Self-pay | Admitting: Internal Medicine

## 2013-05-12 VITALS — BP 144/86 | HR 105 | Temp 97.5°F | Resp 20

## 2013-05-12 DIAGNOSIS — Z5111 Encounter for antineoplastic chemotherapy: Secondary | ICD-10-CM

## 2013-05-12 DIAGNOSIS — C7A1 Malignant poorly differentiated neuroendocrine tumors: Secondary | ICD-10-CM

## 2013-05-12 DIAGNOSIS — C3490 Malignant neoplasm of unspecified part of unspecified bronchus or lung: Secondary | ICD-10-CM

## 2013-05-12 LAB — TYPE AND SCREEN
ABO/RH(D): O POS
Antibody Screen: NEGATIVE
UNIT DIVISION: 0
Unit division: 0

## 2013-05-12 MED ORDER — SODIUM CHLORIDE 0.9 % IV SOLN
Freq: Once | INTRAVENOUS | Status: AC
  Administered 2013-05-12: 13:00:00 via INTRAVENOUS

## 2013-05-12 MED ORDER — DEXAMETHASONE SODIUM PHOSPHATE 10 MG/ML IJ SOLN
INTRAMUSCULAR | Status: AC
  Filled 2013-05-12: qty 1

## 2013-05-12 MED ORDER — SODIUM CHLORIDE 0.9 % IV SOLN
120.0000 mg/m2 | Freq: Once | INTRAVENOUS | Status: AC
  Administered 2013-05-12: 220 mg via INTRAVENOUS
  Filled 2013-05-12: qty 11

## 2013-05-12 MED ORDER — DEXAMETHASONE SODIUM PHOSPHATE 10 MG/ML IJ SOLN
10.0000 mg | Freq: Once | INTRAMUSCULAR | Status: AC
  Administered 2013-05-12: 10 mg via INTRAVENOUS

## 2013-05-12 NOTE — Telephone Encounter (Signed)
Called pt reminded of ibnjection tomorrow and advise pt to get appt calendar  for February 2015

## 2013-05-12 NOTE — Patient Instructions (Signed)
[facility] Discharge Instructions for Patients Receiving Chemotherapy  Today you received the following chemotherapy agent: Etoposide   To help prevent nausea and vomiting after your treatment, we encourage you to take your nausea medication as prescribed.   If you develop nausea and vomiting that is not controlled by your nausea medication, call the clinic.   BELOW ARE SYMPTOMS THAT SHOULD BE REPORTED IMMEDIATELY:  *FEVER GREATER THAN 100.5 F  *CHILLS WITH OR WITHOUT FEVER  NAUSEA AND VOMITING THAT IS NOT CONTROLLED WITH YOUR NAUSEA MEDICATION  *UNUSUAL SHORTNESS OF BREATH  *UNUSUAL BRUISING OR BLEEDING  TENDERNESS IN MOUTH AND THROAT WITH OR WITHOUT PRESENCE OF ULCERS  *URINARY PROBLEMS  *BOWEL PROBLEMS  UNUSUAL RASH Items with * indicate a potential emergency and should be followed up as soon as possible.  Feel free to call the clinic you have any questions or concerns. The clinic phone number is [phone].

## 2013-05-13 ENCOUNTER — Ambulatory Visit (HOSPITAL_BASED_OUTPATIENT_CLINIC_OR_DEPARTMENT_OTHER): Payer: Medicaid Other

## 2013-05-13 VITALS — BP 120/59 | HR 114 | Temp 98.0°F

## 2013-05-13 DIAGNOSIS — Z5189 Encounter for other specified aftercare: Secondary | ICD-10-CM

## 2013-05-13 DIAGNOSIS — C7A1 Malignant poorly differentiated neuroendocrine tumors: Secondary | ICD-10-CM

## 2013-05-13 DIAGNOSIS — C3490 Malignant neoplasm of unspecified part of unspecified bronchus or lung: Secondary | ICD-10-CM

## 2013-05-13 MED ORDER — PEGFILGRASTIM INJECTION 6 MG/0.6ML
6.0000 mg | Freq: Once | SUBCUTANEOUS | Status: AC
Start: 2013-05-13 — End: 2013-05-13
  Administered 2013-05-13: 6 mg via SUBCUTANEOUS
  Filled 2013-05-13: qty 0.6

## 2013-05-13 NOTE — Patient Instructions (Signed)

## 2013-05-17 ENCOUNTER — Other Ambulatory Visit: Payer: Medicaid Other

## 2013-05-17 ENCOUNTER — Other Ambulatory Visit: Payer: Self-pay | Admitting: Physician Assistant

## 2013-05-17 ENCOUNTER — Other Ambulatory Visit: Payer: Self-pay | Admitting: Oncology

## 2013-05-17 ENCOUNTER — Encounter: Payer: Self-pay | Admitting: Physician Assistant

## 2013-05-17 DIAGNOSIS — R112 Nausea with vomiting, unspecified: Secondary | ICD-10-CM

## 2013-05-17 MED ORDER — PROMETHAZINE HCL 25 MG PO TABS: ORAL_TABLET | ORAL | Status: DC

## 2013-05-17 MED ORDER — LORAZEPAM 0.5 MG PO TABS: ORAL_TABLET | ORAL | Status: DC

## 2013-05-20 ENCOUNTER — Other Ambulatory Visit: Payer: Self-pay | Admitting: *Deleted

## 2013-05-20 ENCOUNTER — Ambulatory Visit (HOSPITAL_BASED_OUTPATIENT_CLINIC_OR_DEPARTMENT_OTHER): Payer: Medicaid Other

## 2013-05-20 ENCOUNTER — Emergency Department (HOSPITAL_COMMUNITY): Payer: Medicaid Other

## 2013-05-20 ENCOUNTER — Inpatient Hospital Stay (HOSPITAL_COMMUNITY)
Admission: EM | Admit: 2013-05-20 | Discharge: 2013-05-23 | DRG: 809 | Disposition: A | Discharge status: Home / Self Care | Payer: Medicaid Other | Attending: Internal Medicine | Admitting: Internal Medicine

## 2013-05-20 ENCOUNTER — Encounter (HOSPITAL_COMMUNITY): Payer: Self-pay | Admitting: Emergency Medicine

## 2013-05-20 ENCOUNTER — Telehealth: Payer: Self-pay | Admitting: *Deleted

## 2013-05-20 VITALS — BP 110/60 | HR 100 | Temp 98.1°F | Resp 17

## 2013-05-20 DIAGNOSIS — C3490 Malignant neoplasm of unspecified part of unspecified bronchus or lung: Secondary | ICD-10-CM | POA: Diagnosis present

## 2013-05-20 DIAGNOSIS — I959 Hypotension, unspecified: Secondary | ICD-10-CM | POA: Diagnosis present

## 2013-05-20 DIAGNOSIS — Z8249 Family history of ischemic heart disease and other diseases of the circulatory system: Secondary | ICD-10-CM

## 2013-05-20 DIAGNOSIS — I2510 Atherosclerotic heart disease of native coronary artery without angina pectoris: Secondary | ICD-10-CM | POA: Diagnosis present

## 2013-05-20 DIAGNOSIS — I059 Rheumatic mitral valve disease, unspecified: Secondary | ICD-10-CM | POA: Diagnosis present

## 2013-05-20 DIAGNOSIS — N179 Acute kidney failure, unspecified: Secondary | ICD-10-CM | POA: Diagnosis present

## 2013-05-20 DIAGNOSIS — Z823 Family history of stroke: Secondary | ICD-10-CM

## 2013-05-20 DIAGNOSIS — R1110 Vomiting, unspecified: Secondary | ICD-10-CM | POA: Diagnosis present

## 2013-05-20 DIAGNOSIS — D61810 Antineoplastic chemotherapy induced pancytopenia: Secondary | ICD-10-CM | POA: Diagnosis present

## 2013-05-20 DIAGNOSIS — T451X5A Adverse effect of antineoplastic and immunosuppressive drugs, initial encounter: Secondary | ICD-10-CM | POA: Diagnosis present

## 2013-05-20 DIAGNOSIS — E860 Dehydration: Secondary | ICD-10-CM | POA: Diagnosis present

## 2013-05-20 DIAGNOSIS — R5383 Other fatigue: Secondary | ICD-10-CM

## 2013-05-20 DIAGNOSIS — I080 Rheumatic disorders of both mitral and aortic valves: Secondary | ICD-10-CM | POA: Diagnosis present

## 2013-05-20 DIAGNOSIS — R112 Nausea with vomiting, unspecified: Secondary | ICD-10-CM

## 2013-05-20 DIAGNOSIS — Z87891 Personal history of nicotine dependence: Secondary | ICD-10-CM

## 2013-05-20 DIAGNOSIS — D709 Neutropenia, unspecified: Secondary | ICD-10-CM | POA: Diagnosis present

## 2013-05-20 DIAGNOSIS — R1115 Cyclical vomiting syndrome unrelated to migraine: Secondary | ICD-10-CM | POA: Diagnosis present

## 2013-05-20 DIAGNOSIS — R5381 Other malaise: Secondary | ICD-10-CM | POA: Diagnosis present

## 2013-05-20 DIAGNOSIS — J449 Chronic obstructive pulmonary disease, unspecified: Secondary | ICD-10-CM | POA: Diagnosis present

## 2013-05-20 DIAGNOSIS — E46 Unspecified protein-calorie malnutrition: Secondary | ICD-10-CM | POA: Diagnosis present

## 2013-05-20 DIAGNOSIS — C7A1 Malignant poorly differentiated neuroendocrine tumors: Secondary | ICD-10-CM

## 2013-05-20 DIAGNOSIS — R5081 Fever presenting with conditions classified elsewhere: Secondary | ICD-10-CM | POA: Diagnosis present

## 2013-05-20 DIAGNOSIS — I10 Essential (primary) hypertension: Secondary | ICD-10-CM | POA: Diagnosis present

## 2013-05-20 DIAGNOSIS — IMO0002 Reserved for concepts with insufficient information to code with codable children: Secondary | ICD-10-CM

## 2013-05-20 DIAGNOSIS — E119 Type 2 diabetes mellitus without complications: Secondary | ICD-10-CM | POA: Diagnosis present

## 2013-05-20 DIAGNOSIS — Z880 Allergy status to penicillin: Secondary | ICD-10-CM

## 2013-05-20 DIAGNOSIS — E785 Hyperlipidemia, unspecified: Secondary | ICD-10-CM | POA: Diagnosis present

## 2013-05-20 DIAGNOSIS — D702 Other drug-induced agranulocytosis: Principal | ICD-10-CM | POA: Diagnosis present

## 2013-05-20 DIAGNOSIS — R918 Other nonspecific abnormal finding of lung field: Secondary | ICD-10-CM

## 2013-05-20 DIAGNOSIS — J4489 Other specified chronic obstructive pulmonary disease: Secondary | ICD-10-CM | POA: Diagnosis present

## 2013-05-20 MED ORDER — SODIUM CHLORIDE 0.9 % IV SOLN
Freq: Once | INTRAVENOUS | Status: AC
  Administered 2013-05-20: 16:00:00 via INTRAVENOUS

## 2013-05-20 MED ORDER — SODIUM CHLORIDE 0.9 % IV BOLUS (SEPSIS)
1000.0000 mL | Freq: Once | INTRAVENOUS | Status: AC
  Administered 2013-05-21: 1000 mL via INTRAVENOUS

## 2013-05-20 MED ORDER — LORAZEPAM 2 MG/ML IJ SOLN
0.5000 mg | Freq: Once | INTRAMUSCULAR | Status: AC
  Administered 2013-05-21: 0.5 mg via INTRAVENOUS
  Filled 2013-05-20: qty 1

## 2013-05-20 MED ORDER — ONDANSETRON 8 MG/50ML IVPB (CHCC)
8.0000 mg | Freq: Once | INTRAVENOUS | Status: AC
  Administered 2013-05-20: 8 mg via INTRAVENOUS

## 2013-05-20 NOTE — ED Notes (Signed)
Family states pt has small cell carcinoma  Pt receives chemo Mon, tues, and wed and receives a shot for her wbcs of Thursday  Usually after this she has vomiting for a few days but by the fifth day she is back to eating and feeling better  Family states this time she has continued to have vomiting through today  Family took her to the cancer center earlier today where she received IVF and medication for nausea and vomiting  After they got home she continued to have vomiting and now has a fever  Instructed to bring her here for further treatment

## 2013-05-20 NOTE — ED Provider Notes (Signed)
CSN: [id number]     Arrival date & time 05/20/13  2313 History   First MD Initiated Contact with Patient 05/20/13 2338     Chief Complaint  Patient presents with  . Emesis  . Fever   (Consider location/radiation/quality/duration/timing/severity/associated sxs/prior Treatment) HPI Comments: Patient with history of RUL small cell lung cancer diagnosed 12/2012 currently undergoing systemic chemotherapy with cisplatin given on day 1 and etoposide given on days 1, 2 and 3 with Neulasta support given on day 4 every 3 weeks. Finished 4th cycle 05/19/2012. Patient has had very persistent vomiting over the past few days. She went in for IV Zofran and fluid bolus today. Upon returning home she resumed vomiting. This evening the patient developed a fever into the 101F range. Patient denies upper respiratory tract infection symptoms, significant cough, shortness of breath, change in abdominal pain, significant skin rash. She has not had diarrhea or urinary symptoms. The onset of this condition was acute. The course is constant. Aggravating factors: none. Alleviating factors: none.    The history is provided by the patient.    Past Medical History  Diagnosis Date  . Tobacco abuse   . Diabetes mellitus   . Mitral regurgitation   . Hypertension   . Dyslipidemia   . Coronary artery disease   . History of echocardiogram 7/10    EF 50-55%; inferior hypokinesis; RV function mild decreased  . ACE-inhibitor cough   . Multiple allergies     Penicillin and mushrooms  . Cancer     lung ca   Past Surgical History  Procedure Laterality Date  . Video bronchoscopy Bilateral 01/26/2013    Procedure: VIDEO BRONCHOSCOPY WITHOUT FLUORO;  Surgeon: Tanda Rockers, MD;  Location: [facility];  Service: Cardiopulmonary;  Laterality: Bilateral;   Family History  Problem Relation Age of Onset  . Stroke Mother   . Coronary artery disease Father    History  Substance Use Topics  . Smoking status: Former Smoker  -- 1.50 packs/day for 40 years    Types: Cigarettes    Quit date: 01/30/2013  . Smokeless tobacco: Never Used  . Alcohol Use: No   OB History   Grav Para Term Preterm Abortions TAB SAB Ect Mult Living                 Review of Systems  Constitutional: Positive for fever.  HENT: Negative for rhinorrhea and sore throat.   Eyes: Negative for redness.  Respiratory: Negative for cough.   Cardiovascular: Negative for chest pain.  Gastrointestinal: Positive for nausea, vomiting and abdominal pain. Negative for diarrhea.  Genitourinary: Negative for dysuria.  Musculoskeletal: Positive for back pain (typical post-chemo). Negative for myalgias.  Skin: Negative for rash.  Neurological: Negative for headaches.    Allergies  Codeine and Penicillins  Home Medications   Current Outpatient Rx  Name  Route  Sig  Dispense  Refill  . albuterol (PROVENTIL HFA;VENTOLIN HFA) 108 (90 BASE) MCG/ACT inhaler   Inhalation   Inhale 2 puffs into the lungs every 6 (six) hours as needed for wheezing or shortness of breath.   1 Inhaler   2   . Alum & Mag Hydroxide-Simeth (MAGIC MOUTHWASH) SOLN   Oral   Take 5 mLs by mouth 4 (four) times daily as needed for mouth pain.         Marland Kitchen aspirin 81 MG tablet   Oral   Take 81 mg by mouth daily.         Marland Kitchen  B Complex-C (SUPER B COMPLEX PO)   Oral   Take 1 tablet by mouth daily.         . isosorbide mononitrate (IMDUR) 30 MG 24 hr tablet   Oral   Take 30 mg by mouth daily.           Marland Kitchen loratadine (CLARITIN) 10 MG tablet   Oral   Take 10 mg by mouth daily.         Marland Kitchen LORazepam (ATIVAN) 0.5 MG tablet      1 tablet by mouth or sublingually every 8 hours as needed for nausea uncontrollerd by Phenergan or Zofran   30 tablet   0     CALLED TO PHARMACIST.   Marland Kitchen lovastatin (MEVACOR) 20 MG tablet   Oral   Take 20 mg by mouth at bedtime.         . magnesium oxide (MAG-OX) 400 (241.3 MG) MG tablet   Oral   Take 1 tablet (400 mg total) by mouth 3  (three) times daily.   90 tablet   0   . metFORMIN (GLUCOPHAGE) 850 MG tablet   Oral   Take 850 mg by mouth 2 (two) times daily with a meal.         . metoprolol (TOPROL-XL) 50 MG 24 hr tablet   Oral   Take 50 mg by mouth daily.           . Misc Natural Products (ENERGY FOCUS) TABS   Oral   Take by mouth.         . ondansetron (ZOFRAN) 8 MG tablet   Oral   Take 1 tablet (8 mg total) by mouth every 8 (eight) hours as needed for nausea.   30 tablet   2   . oxyCODONE-acetaminophen (PERCOCET/ROXICET) 5-325 MG per tablet   Oral   Take 1 tablet by mouth every 4 (four) hours as needed for severe pain.         . promethazine (PHENERGAN) 25 MG tablet      TAKE ONE TABLET BY MOUTH EVERY 6 HOURS AS NEEDED   30 tablet   0     CALLED TO PHARMACIST.   Marland Kitchen sucralfate (CARAFATE) 1 GM/10ML suspension   Oral   Take 1 g by mouth 4 (four) times daily -  with meals and at bedtime.         . valsartan (DIOVAN) 160 MG tablet   Oral   Take 160 mg by mouth daily.            BP 127/67  Pulse 109  Temp(Src) 101 F (38.3 C) (Oral)  Resp 15  Ht 5\' 6"  (1.676 m)  Wt 154 lb (69.854 kg)  BMI 24.87 kg/m2  SpO2 97% Physical Exam  Nursing note and vitals reviewed. Constitutional: She appears well-developed and well-nourished.  HENT:  Head: Normocephalic and atraumatic.  Eyes: Conjunctivae are normal. Right eye exhibits no discharge. Left eye exhibits no discharge.  Neck: Normal range of motion. Neck supple.  Cardiovascular: Regular rhythm.  Tachycardia present.   Murmur heard.  Systolic murmur is present with a grade of 2/6  Pulmonary/Chest: Effort normal and breath sounds normal. No respiratory distress.  Abdominal: Soft. Bowel sounds are normal. She exhibits no distension. There is tenderness (upper abdominal pain bilaterally). There is no rebound and no guarding.  Musculoskeletal: She exhibits no edema and no tenderness.  Neurological: She is alert.  Skin: Skin is warm and  dry. No rash noted.  Psychiatric: She has a normal mood and affect.    ED Course  Procedures (including critical care time) Labs Review Labs Reviewed  CULTURE, BLOOD (ROUTINE X 2)  CULTURE, BLOOD (ROUTINE X 2)  CBC WITH DIFFERENTIAL  COMPREHENSIVE METABOLIC PANEL  URINALYSIS, ROUTINE W REFLEX MICROSCOPIC   Imaging Review Dg Chest 2 View  05/21/2013   CLINICAL DATA:  Fever, small cell lung cancer, receiving chemotherapy  EXAM: CHEST  2 VIEW  COMPARISON:  05/07/2013 CT  FINDINGS: Stable spiculated nodule in the peripheral right upper lobe. Background emphysema noted. Chronic right middle lobe collapse along the right cardiac border. No edema or definite pneumonia. No effusion or pneumothorax. Trachea midline. Normal heart size.  IMPRESSION: Stable emphysema and chronic right middle lobe collapse.  Persistent 15 mm right upper lobe spiculated nodule  No superimposed new or acute process   Electronically Signed   By: Daryll Brod M.D.   On: 05/21/2013 00:08    EKG Interpretation   None      11:49 PM Patient seen and examined. Work-up initiated. Medications ordered. D/w Dr. Marnette Burgess.   Vital signs reviewed and are as follows: Filed Vitals:   05/20/13 2320  BP: 127/67  Pulse: 109  Temp: 101 F (38.3 C)  Resp: 15   12:54 AM Handoff to [employer]. Plan: Admit when labs return.    MDM   1. Fever   2. Intractable vomiting    Admit for fever in setting of immunocompromise, vomiting after chemotherapy.     Carlisle Cater, PA-C 05/21/13 [phone]

## 2013-05-20 NOTE — Telephone Encounter (Signed)
Pt's husband called stating that patient has been vomiting.  He thinks that she has vomited approximately 3 times daily and about 15-20 times total in the past week.  She was given rx's for phenergan and lorazepam on 1/26, she tried to take them but threw them back up.  Advised that she take the lorazepam sublingually versus swallowing it.  Per Dr Vista Mink, okay for pt to come in for IVF and IV zofran.  Pt's husband verbalized understanding.  SLJ

## 2013-05-20 NOTE — Patient Instructions (Signed)
Please take lorazepam/Ativan 0.5 mg under the tongue every 8 hrs for nausea or vomiting not relived by Phenergan or Zofran.  Take Phenergan 25 mg every 6 hrs as needed and Zofran 8 mg every 8 hours as needed for nausea or vomiting.  Avoid acidic beverages and foods.  Avoid fried, oily. Greasy and spicy foods.  Avoid milk if lactose intolerant.  Try popsickles, italian ices, jello, broth, herbal teas, Gatorade or sports drinks (propel) to stay hydrated.  Nausea and Vomiting Nausea is a sick feeling that often comes before throwing up (vomiting). Vomiting is a reflex where stomach contents come out of your mouth. Vomiting can cause severe loss of body fluids (dehydration). Children and elderly adults can become dehydrated quickly, especially if they also have diarrhea. Nausea and vomiting are symptoms of a condition or disease. It is important to find the cause of your symptoms. CAUSES   Direct irritation of the stomach lining. This irritation can result from increased acid production (gastroesophageal reflux disease), infection, food poisoning, taking certain medicines (such as nonsteroidal anti-inflammatory drugs), alcohol use, or tobacco use.  Signals from the brain.These signals could be caused by a headache, heat exposure, an inner ear disturbance, increased pressure in the brain from injury, infection, a tumor, or a concussion, pain, emotional stimulus, or metabolic problems.  An obstruction in the gastrointestinal tract (bowel obstruction).  Illnesses such as diabetes, hepatitis, gallbladder problems, appendicitis, kidney problems, cancer, sepsis, atypical symptoms of a heart attack, or eating disorders.  Medical treatments such as chemotherapy and radiation.  Receiving medicine that makes you sleep (general anesthetic) during surgery. DIAGNOSIS Your caregiver may ask for tests to be done if the problems do not improve after a few days. Tests may also be done if symptoms are severe or if the  reason for the nausea and vomiting is not clear. Tests may include:  Urine tests.  Blood tests.  Stool tests.  Cultures (to look for evidence of infection).  X-rays or other imaging studies. Test results can help your caregiver make decisions about treatment or the need for additional tests. TREATMENT You need to stay well hydrated. Drink frequently but in small amounts.You may wish to drink water, sports drinks, clear broth, or eat frozen ice pops or gelatin dessert to help stay hydrated.When you eat, eating slowly may help prevent nausea.There are also some antinausea medicines that may help prevent nausea. HOME CARE INSTRUCTIONS   Take all medicine as directed by your caregiver.  If you do not have an appetite, do not force yourself to eat. However, you must continue to drink fluids.  If you have an appetite, eat a normal diet unless your caregiver tells you differently.  Eat a variety of complex carbohydrates (rice, wheat, potatoes, bread), lean meats, yogurt, fruits, and vegetables.  Avoid high-fat foods because they are more difficult to digest.  Drink enough water and fluids to keep your urine clear or pale yellow.  If you are dehydrated, ask your caregiver for specific rehydration instructions. Signs of dehydration may include:  Severe thirst.  Dry lips and mouth.  Dizziness.  Dark urine.  Decreasing urine frequency and amount.  Confusion.  Rapid breathing or pulse. SEEK IMMEDIATE MEDICAL CARE IF:   You have blood or brown flecks (like coffee grounds) in your vomit.  You have black or bloody stools.  You have a severe headache or stiff neck.  You are confused.  You have severe abdominal pain.  You have chest pain or trouble breathing.  You do not urinate at least once every 8 hours.  You develop cold or clammy skin.  You continue to vomit for longer than 24 to 48 hours.  You have a fever. MAKE SURE YOU:   Understand these  instructions.  Will watch your condition.  Will get help right away if you are not doing well or get worse. Document Released: 04/08/2005 Document Revised: 07/01/2011 Document Reviewed: 09/05/2010 [facility] Patient Information 2014 [place], [place].  Dehydration, Adult Dehydration is when you lose more fluids from the body than you take in. Vital organs like the kidneys, brain, and heart cannot function without a proper amount of fluids and salt. Any loss of fluids from the body can cause dehydration.  CAUSES   Vomiting.  Diarrhea.  Excessive sweating.  Excessive urine output.  Fever. SYMPTOMS  Mild dehydration  Thirst.  Dry lips.  Slightly dry mouth. Moderate dehydration  Very dry mouth.  Sunken eyes.  Skin does not bounce back quickly when lightly pinched and released.  Dark urine and decreased urine production.  Decreased tear production.  Headache. Severe dehydration  Very dry mouth.  Extreme thirst.  Rapid, weak pulse (more than 100 beats per minute at rest).  Cold hands and feet.  Not able to sweat in spite of heat and temperature.  Rapid breathing.  Blue lips.  Confusion and lethargy.  Difficulty being awakened.  Minimal urine production.  No tears. DIAGNOSIS  Your caregiver will diagnose dehydration based on your symptoms and your exam. Blood and urine tests will help confirm the diagnosis. The diagnostic evaluation should also identify the cause of dehydration. TREATMENT  Treatment of mild or moderate dehydration can often be done at home by increasing the amount of fluids that you drink. It is best to drink small amounts of fluid more often. Drinking too much at one time can make vomiting worse. Refer to the home care instructions below. Severe dehydration needs to be treated at the hospital where you will probably be given intravenous (IV) fluids that contain water and electrolytes. HOME CARE INSTRUCTIONS   Ask your caregiver about  specific rehydration instructions.  Drink enough fluids to keep your urine clear or pale yellow.  Drink small amounts frequently if you have nausea and vomiting.  Eat as you normally do.  Avoid:  Foods or drinks high in sugar.  Carbonated drinks.  Juice.  Extremely hot or cold fluids.  Drinks with caffeine.  Fatty, greasy foods.  Alcohol.  Tobacco.  Overeating.  Gelatin desserts.  Wash your hands well to avoid spreading bacteria and viruses.  Only take over-the-counter or prescription medicines for pain, discomfort, or fever as directed by your caregiver.  Ask your caregiver if you should continue all prescribed and over-the-counter medicines.  Keep all follow-up appointments with your caregiver. SEEK MEDICAL CARE IF:  You have abdominal pain and it increases or stays in one area (localizes).  You have a rash, stiff neck, or severe headache.  You are irritable, sleepy, or difficult to awaken.  You are weak, dizzy, or extremely thirsty. SEEK IMMEDIATE MEDICAL CARE IF:   You are unable to keep fluids down or you get worse despite treatment.  You have frequent episodes of vomiting or diarrhea.  You have blood or green matter (bile) in your vomit.  You have blood in your stool or your stool looks black and tarry.  You have not urinated in 6 to 8 hours, or you have only urinated a small amount of very dark urine.  You have a fever.  You faint. MAKE SURE YOU:   Understand these instructions.  Will watch your condition.  Will get help right away if you are not doing well or get worse. Document Released: 04/08/2005 Document Revised: 07/01/2011 Document Reviewed: 11/26/2010 [facility] Patient Information 2014 [place], [place].

## 2013-05-20 NOTE — ED Notes (Signed)
PA at bedside.

## 2013-05-21 ENCOUNTER — Other Ambulatory Visit: Payer: Self-pay

## 2013-05-21 ENCOUNTER — Encounter (HOSPITAL_COMMUNITY): Payer: Self-pay | Admitting: Internal Medicine

## 2013-05-21 DIAGNOSIS — E860 Dehydration: Secondary | ICD-10-CM

## 2013-05-21 DIAGNOSIS — D709 Neutropenia, unspecified: Secondary | ICD-10-CM

## 2013-05-21 DIAGNOSIS — C7A1 Malignant poorly differentiated neuroendocrine tumors: Secondary | ICD-10-CM

## 2013-05-21 DIAGNOSIS — R1110 Vomiting, unspecified: Secondary | ICD-10-CM | POA: Diagnosis present

## 2013-05-21 DIAGNOSIS — D61818 Other pancytopenia: Secondary | ICD-10-CM

## 2013-05-21 DIAGNOSIS — R5081 Fever presenting with conditions classified elsewhere: Secondary | ICD-10-CM

## 2013-05-21 LAB — CBC WITH DIFFERENTIAL/PLATELET
BASOS PCT: 2 % — AB (ref 0–1)
Basophils Absolute: 0 10*3/uL (ref 0.0–0.1)
Basophils Absolute: 0 10*3/uL (ref 0.0–0.1)
Basophils Relative: 2 % — ABNORMAL HIGH (ref 0–1)
EOS ABS: 0 10*3/uL (ref 0.0–0.7)
EOS PCT: 3 % (ref 0–5)
Eosinophils Absolute: 0 10*3/uL (ref 0.0–0.7)
Eosinophils Relative: 2 % (ref 0–5)
HCT: 24.2 % — ABNORMAL LOW (ref 36.0–46.0)
HCT: 24.7 % — ABNORMAL LOW (ref 36.0–46.0)
HEMOGLOBIN: 8.4 g/dL — AB (ref 12.0–15.0)
Hemoglobin: 8.8 g/dL — ABNORMAL LOW (ref 12.0–15.0)
LYMPHS ABS: 0.2 10*3/uL — AB (ref 0.7–4.0)
LYMPHS PCT: 30 % (ref 12–46)
Lymphocytes Relative: 33 % (ref 12–46)
Lymphs Abs: 0.2 10*3/uL — ABNORMAL LOW (ref 0.7–4.0)
MCH: 32.3 pg (ref 26.0–34.0)
MCH: 32.7 pg (ref 26.0–34.0)
MCHC: 34.7 g/dL (ref 30.0–36.0)
MCHC: 35.6 g/dL (ref 30.0–36.0)
MCV: 91.8 fL (ref 78.0–100.0)
MCV: 93.1 fL (ref 78.0–100.0)
MONO ABS: 0.3 10*3/uL (ref 0.1–1.0)
MONOS PCT: 50 % — AB (ref 3–12)
Monocytes Absolute: 0.4 10*3/uL (ref 0.1–1.0)
Monocytes Relative: 44 % — ABNORMAL HIGH (ref 3–12)
NEUTROS PCT: 15 % — AB (ref 43–77)
NEUTROS PCT: 19 % — AB (ref 43–77)
Neutro Abs: 0.1 10*3/uL — ABNORMAL LOW (ref 1.7–7.7)
Neutro Abs: 0.1 10*3/uL — ABNORMAL LOW (ref 1.7–7.7)
PLATELETS: 43 10*3/uL — AB (ref 150–400)
PLATELETS: 53 10*3/uL — AB (ref 150–400)
RBC: 2.6 MIL/uL — AB (ref 3.87–5.11)
RBC: 2.69 MIL/uL — AB (ref 3.87–5.11)
RDW: 14.4 % (ref 11.5–15.5)
RDW: 14.2 % (ref 11.5–15.5)
WBC: 0.6 10*3/uL — CL (ref 4.0–10.5)
WBC: 0.7 10*3/uL — AB (ref 4.0–10.5)

## 2013-05-21 LAB — URINALYSIS, ROUTINE W REFLEX MICROSCOPIC
Bilirubin Urine: NEGATIVE
GLUCOSE, UA: NEGATIVE mg/dL
Ketones, ur: NEGATIVE mg/dL
LEUKOCYTES UA: NEGATIVE
Nitrite: NEGATIVE
PH: 5.5 (ref 5.0–8.0)
PROTEIN: NEGATIVE mg/dL
Specific Gravity, Urine: 1.014 (ref 1.005–1.030)
Urobilinogen, UA: 0.2 mg/dL (ref 0.0–1.0)

## 2013-05-21 LAB — COMPREHENSIVE METABOLIC PANEL
ALBUMIN: 2.7 g/dL — AB (ref 3.5–5.2)
ALT: 11 U/L (ref 0–35)
ALT: 10 U/L (ref 0–35)
AST: 11 U/L (ref 0–37)
AST: 10 U/L (ref 0–37)
Albumin: 3.2 g/dL — ABNORMAL LOW (ref 3.5–5.2)
Alkaline Phosphatase: 69 U/L (ref 39–117)
Alkaline Phosphatase: 60 U/L (ref 39–117)
BILIRUBIN TOTAL: 0.4 mg/dL (ref 0.3–1.2)
BUN: 26 mg/dL — ABNORMAL HIGH (ref 6–23)
BUN: 23 mg/dL (ref 6–23)
CALCIUM: 9.2 mg/dL (ref 8.4–10.5)
CHLORIDE: 97 meq/L (ref 96–112)
CO2: 24 mEq/L (ref 19–32)
CO2: 23 meq/L (ref 19–32)
CREATININE: 1.22 mg/dL — AB (ref 0.50–1.10)
CREATININE: 1.31 mg/dL — AB (ref 0.50–1.10)
Calcium: 8.4 mg/dL (ref 8.4–10.5)
Chloride: 93 mEq/L — ABNORMAL LOW (ref 96–112)
GFR, EST AFRICAN AMERICAN: 51 mL/min — AB (ref >90)
GFR, EST AFRICAN AMERICAN: 55 mL/min — AB (ref >90)
GFR, EST NON AFRICAN AMERICAN: 44 mL/min — AB (ref >90)
GFR, EST NON AFRICAN AMERICAN: 48 mL/min — AB (ref >90)
Glucose, Bld: 165 mg/dL — ABNORMAL HIGH (ref 70–99)
Glucose, Bld: 137 mg/dL — ABNORMAL HIGH (ref 70–99)
Potassium: 4.4 mEq/L (ref 3.7–5.3)
Potassium: 4.1 mEq/L (ref 3.7–5.3)
Sodium: 132 mEq/L — ABNORMAL LOW (ref 137–147)
Sodium: 133 mEq/L — ABNORMAL LOW (ref 137–147)
Total Bilirubin: 0.5 mg/dL (ref 0.3–1.2)
Total Protein: 7.3 g/dL (ref 6.0–8.3)
Total Protein: 6.4 g/dL (ref 6.0–8.3)

## 2013-05-21 LAB — CG4 I-STAT (LACTIC ACID): Lactic Acid, Venous: 1.09 mmol/L (ref 0.5–2.2)

## 2013-05-21 LAB — GLUCOSE, CAPILLARY: GLUCOSE-CAPILLARY: 100 mg/dL — AB (ref 70–99)

## 2013-05-21 LAB — INFLUENZA PANEL BY PCR (TYPE A & B)
H1N1FLUPCR: NOT DETECTED
INFLBPCR: NEGATIVE
Influenza A By PCR: NEGATIVE

## 2013-05-21 LAB — URINE MICROSCOPIC-ADD ON

## 2013-05-21 LAB — TROPONIN I: Troponin I: <0.3 ng/mL (ref <0.30)

## 2013-05-21 MED ORDER — ISOSORBIDE MONONITRATE ER 30 MG PO TB24
30.0000 mg | ORAL_TABLET | Freq: Every day | ORAL | Status: DC
  Administered 2013-05-22 – 2013-05-23 (×2): 30 mg via ORAL
  Filled 2013-05-21 (×3): qty 1

## 2013-05-21 MED ORDER — ONDANSETRON HCL 4 MG/2ML IJ SOLN
4.0000 mg | Freq: Four times a day (QID) | INTRAMUSCULAR | Status: DC | PRN
  Administered 2013-05-21 – 2013-05-22 (×3): 4 mg via INTRAVENOUS
  Filled 2013-05-21 (×3): qty 2

## 2013-05-21 MED ORDER — ALBUTEROL SULFATE (2.5 MG/3ML) 0.083% IN NEBU: 3.0000 mL | INHALATION_SOLUTION | Freq: Four times a day (QID) | RESPIRATORY_TRACT | Status: DC | PRN

## 2013-05-21 MED ORDER — INSULIN ASPART 100 UNIT/ML ~~LOC~~ SOLN
0.0000 [IU] | Freq: Three times a day (TID) | SUBCUTANEOUS | Status: DC
  Administered 2013-05-22: 1 [IU] via SUBCUTANEOUS
  Administered 2013-05-22 – 2013-05-23 (×2): 2 [IU] via SUBCUTANEOUS

## 2013-05-21 MED ORDER — ACETAMINOPHEN 325 MG PO TABS
650.0000 mg | ORAL_TABLET | Freq: Once | ORAL | Status: AC
  Administered 2013-05-21: 650 mg via ORAL
  Filled 2013-05-21: qty 2

## 2013-05-21 MED ORDER — SIMVASTATIN 10 MG PO TABS
10.0000 mg | ORAL_TABLET | Freq: Every day | ORAL | Status: DC
  Administered 2013-05-22: 10 mg via ORAL
  Filled 2013-05-21 (×3): qty 1

## 2013-05-21 MED ORDER — HYDRALAZINE HCL 20 MG/ML IJ SOLN: 10.0000 mg | INTRAMUSCULAR | Status: DC | PRN

## 2013-05-21 MED ORDER — MAGNESIUM OXIDE 400 (241.3 MG) MG PO TABS
400.0000 mg | ORAL_TABLET | Freq: Three times a day (TID) | ORAL | Status: DC
  Administered 2013-05-21 – 2013-05-23 (×5): 400 mg via ORAL
  Filled 2013-05-21 (×9): qty 1

## 2013-05-21 MED ORDER — BENEPROTEIN PO POWD
1.0000 | Freq: Two times a day (BID) | ORAL | Status: DC
  Filled 2013-05-21: qty 227

## 2013-05-21 MED ORDER — MAGIC MOUTHWASH
5.0000 mL | Freq: Four times a day (QID) | ORAL | Status: DC | PRN
  Administered 2013-05-22: 5 mL via ORAL
  Filled 2013-05-21 (×2): qty 5

## 2013-05-21 MED ORDER — ACETAMINOPHEN 325 MG PO TABS
650.0000 mg | ORAL_TABLET | Freq: Four times a day (QID) | ORAL | Status: DC | PRN
Start: 2013-05-21 — End: 2013-05-23

## 2013-05-21 MED ORDER — HYDROMORPHONE HCL PF 1 MG/ML IJ SOLN: 0.5000 mg | INTRAMUSCULAR | Status: DC | PRN

## 2013-05-21 MED ORDER — DEXTROSE 5 % IV SOLN
2.0000 g | Freq: Two times a day (BID) | INTRAVENOUS | Status: DC
  Administered 2013-05-21 – 2013-05-23 (×4): 2 g via INTRAVENOUS
  Filled 2013-05-21 (×6): qty 2

## 2013-05-21 MED ORDER — ONDANSETRON HCL 8 MG PO TABS: 8.0000 mg | ORAL_TABLET | Freq: Three times a day (TID) | ORAL | Status: DC | PRN

## 2013-05-21 MED ORDER — RESOURCE INSTANT PROTEIN PO PWD PACKET
6.0000 g | Freq: Two times a day (BID) | ORAL | Status: DC
  Administered 2013-05-22 – 2013-05-23 (×2): 6 g via ORAL
  Filled 2013-05-21 (×6): qty 6

## 2013-05-21 MED ORDER — ACETAMINOPHEN 650 MG RE SUPP
650.0000 mg | Freq: Four times a day (QID) | RECTAL | Status: DC | PRN
Start: 2013-05-21 — End: 2013-05-23

## 2013-05-21 MED ORDER — OXYCODONE-ACETAMINOPHEN 5-325 MG PO TABS
1.0000 | ORAL_TABLET | ORAL | Status: DC | PRN
  Administered 2013-05-22: 1 via ORAL
  Filled 2013-05-21: qty 1

## 2013-05-21 MED ORDER — PROMETHAZINE HCL 25 MG PO TABS: 25.0000 mg | ORAL_TABLET | Freq: Four times a day (QID) | ORAL | Status: DC | PRN

## 2013-05-21 MED ORDER — ONDANSETRON HCL 4 MG PO TABS: 4.0000 mg | ORAL_TABLET | Freq: Four times a day (QID) | ORAL | Status: DC | PRN

## 2013-05-21 MED ORDER — SODIUM CHLORIDE 0.9 % IV SOLN
INTRAVENOUS | Status: AC
  Administered 2013-05-21 (×2): via INTRAVENOUS

## 2013-05-21 MED ORDER — ALUM & MAG HYDROXIDE-SIMETH 200-200-20 MG/5ML PO SUSP
30.0000 mL | Freq: Once | ORAL | Status: AC
  Administered 2013-05-21: 30 mL via ORAL
  Filled 2013-05-21: qty 30

## 2013-05-21 MED ORDER — LORATADINE 10 MG PO TABS
10.0000 mg | ORAL_TABLET | Freq: Every day | ORAL | Status: DC
  Administered 2013-05-22 – 2013-05-23 (×2): 10 mg via ORAL
  Filled 2013-05-21 (×3): qty 1

## 2013-05-21 MED ORDER — SUCRALFATE 1 GM/10ML PO SUSP
1.0000 g | Freq: Three times a day (TID) | ORAL | Status: DC
  Administered 2013-05-21 – 2013-05-23 (×7): 1 g via ORAL
  Filled 2013-05-21 (×13): qty 10

## 2013-05-21 MED ORDER — BOOST / RESOURCE BREEZE PO LIQD
1.0000 | Freq: Three times a day (TID) | ORAL | Status: DC
  Administered 2013-05-21 – 2013-05-22 (×3): 1 via ORAL

## 2013-05-21 MED ORDER — VANCOMYCIN HCL IN DEXTROSE 750-5 MG/150ML-% IV SOLN
750.0000 mg | Freq: Two times a day (BID) | INTRAVENOUS | Status: DC
  Administered 2013-05-21: 750 mg via INTRAVENOUS
  Filled 2013-05-21 (×2): qty 150

## 2013-05-21 MED ORDER — PRO-STAT SUGAR FREE PO LIQD
30.0000 mL | Freq: Two times a day (BID) | ORAL | Status: DC
  Administered 2013-05-22 – 2013-05-23 (×2): 30 mL via ORAL
  Filled 2013-05-21 (×6): qty 30

## 2013-05-21 MED ORDER — LEVOFLOXACIN IN D5W 750 MG/150ML IV SOLN
750.0000 mg | Freq: Once | INTRAVENOUS | Status: AC
  Administered 2013-05-21: 750 mg via INTRAVENOUS
  Filled 2013-05-21: qty 150

## 2013-05-21 MED ORDER — ASPIRIN 81 MG PO CHEW
81.0000 mg | CHEWABLE_TABLET | Freq: Every day | ORAL | Status: DC
  Administered 2013-05-21 – 2013-05-22 (×2): 81 mg via ORAL
  Filled 2013-05-21 (×3): qty 1

## 2013-05-21 MED ORDER — LORAZEPAM 0.5 MG PO TABS
0.5000 mg | ORAL_TABLET | Freq: Three times a day (TID) | ORAL | Status: DC | PRN
  Administered 2013-05-21: 0.5 mg via SUBLINGUAL
  Filled 2013-05-21: qty 1

## 2013-05-21 NOTE — Care Management Note (Signed)
   CARE MANAGEMENT NOTE 05/21/2013  Patient:  DANAJAH, BIRDSELL   Account Number:  [id number]  Date Initiated:  05/21/2013  Documentation initiated by:  Meleni Delahunt  Subjective/Objective Assessment:   58 yo female admitted with neutropenic fever. 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   (If response is "NO", the following Medicare IM given date fields will be blank) Date Medicare IM given:   Date Additional Medicare IM given:    Discharge Disposition:    Per UR Regulation:  Reviewed for med. necessity/level of care/duration of stay  If discussed at [place] of Stay Meetings, dates discussed:    Comments:  05/21/13 [place] Chart reviewed for utilization of services. No needs identified at this time. Specialists: Dr. Julien Nordmann. Oncologist.

## 2013-05-21 NOTE — ED Provider Notes (Signed)
Medical screening examination/treatment/procedure(s) were conducted as a shared visit with non-physician practitioner(s) and myself.  I personally evaluated the patient during the encounter.  Results for orders placed during the hospital encounter of 05/20/13  CBC WITH DIFFERENTIAL      Result Value Range   WBC 0.6 (*) 4.0 - 10.5 K/uL   RBC 2.60 (*) 3.87 - 5.11 MIL/uL   Hemoglobin 8.4 (*) 12.0 - 15.0 g/dL   HCT 24.2 (*) 36.0 - 46.0 %   MCV 93.1  78.0 - 100.0 fL   MCH 32.3  26.0 - 34.0 pg   MCHC 34.7  30.0 - 36.0 g/dL   RDW 14.4  11.5 - 15.5 %   Platelets 53 (*) 150 - 400 K/uL   Neutrophils Relative % 19 (*) 43 - 77 %   Lymphocytes Relative 33  12 - 46 %   Monocytes Relative 44 (*) 3 - 12 %   Eosinophils Relative 2  0 - 5 %   Basophils Relative 2 (*) 0 - 1 %   Neutro Abs 0.1 (*) 1.7 - 7.7 K/uL   Lymphs Abs 0.2 (*) 0.7 - 4.0 K/uL   Monocytes Absolute 0.3  0.1 - 1.0 K/uL   Eosinophils Absolute 0.0  0.0 - 0.7 K/uL   Basophils Absolute 0.0  0.0 - 0.1 K/uL   RBC Morphology STOMATOCYTES     WBC Morphology MILD LEFT SHIFT (1-5% METAS, OCC MYELO, OCC BANDS)    COMPREHENSIVE METABOLIC PANEL      Result Value Range   Sodium 132 (*) 137 - 147 mEq/L   Potassium 4.4  3.7 - 5.3 mEq/L   Chloride 93 (*) 96 - 112 mEq/L   CO2 24  19 - 32 mEq/L   Glucose, Bld 165 (*) 70 - 99 mg/dL   BUN 26 (*) 6 - 23 mg/dL   Creatinine, Ser 1.31 (*) 0.50 - 1.10 mg/dL   Calcium 9.2  8.4 - 10.5 mg/dL   Total Protein 7.3  6.0 - 8.3 g/dL   Albumin 3.2 (*) 3.5 - 5.2 g/dL   AST 11  0 - 37 U/L   ALT 11  0 - 35 U/L   Alkaline Phosphatase 69  39 - 117 U/L   Total Bilirubin 0.5  0.3 - 1.2 mg/dL   GFR calc non Af Amer 44 (*) >90 mL/min   GFR calc Af Amer 51 (*) >90 mL/min  URINALYSIS, ROUTINE W REFLEX MICROSCOPIC      Result Value Range   Color, Urine YELLOW  YELLOW   APPearance CLEAR  CLEAR   Specific Gravity, Urine 1.014  1.005 - 1.030   pH 5.5  5.0 - 8.0   Glucose, UA NEGATIVE  NEGATIVE mg/dL   Hgb urine  dipstick TRACE (*) NEGATIVE   Bilirubin Urine NEGATIVE  NEGATIVE   Ketones, ur NEGATIVE  NEGATIVE mg/dL   Protein, ur NEGATIVE  NEGATIVE mg/dL   Urobilinogen, UA 0.2  0.0 - 1.0 mg/dL   Nitrite NEGATIVE  NEGATIVE   Leukocytes, UA NEGATIVE  NEGATIVE  URINE MICROSCOPIC-ADD ON      Result Value Range   Squamous Epithelial / LPF RARE  RARE   RBC / HPF 0-2  <3 RBC/hpf  CBC WITH DIFFERENTIAL      Result Value Range   WBC 0.7 (*) 4.0 - 10.5 K/uL   RBC 2.69 (*) 3.87 - 5.11 MIL/uL   Hemoglobin 8.8 (*) 12.0 - 15.0 g/dL   HCT 24.7 (*) 36.0 - 46.0 %  MCV 91.8  78.0 - 100.0 fL   MCH 32.7  26.0 - 34.0 pg   MCHC 35.6  30.0 - 36.0 g/dL   RDW 14.2  11.5 - 15.5 %   Platelets PENDING  150 - 400 K/uL   Neutrophils Relative % PENDING  43 - 77 %   Neutro Abs PENDING  1.7 - 7.7 K/uL   Band Neutrophils PENDING  0 - 10 %   Lymphocytes Relative PENDING  12 - 46 %   Lymphs Abs PENDING  0.7 - 4.0 K/uL   Monocytes Relative PENDING  3 - 12 %   Monocytes Absolute PENDING  0.1 - 1.0 K/uL   Eosinophils Relative PENDING  0 - 5 %   Eosinophils Absolute PENDING  0.0 - 0.7 K/uL   Basophils Relative PENDING  0 - 1 %   Basophils Absolute PENDING  0.0 - 0.1 K/uL   WBC Morphology PENDING     RBC Morphology PENDING     Smear Review PENDING     nRBC PENDING  0 /100 WBC   Metamyelocytes Relative PENDING     Myelocytes PENDING     Promyelocytes Absolute PENDING     Blasts PENDING    CG4 I-STAT (LACTIC ACID)      Result Value Range   Lactic Acid, Venous 1.09  0.5 - 2.2 mmol/L   Dg Chest 2 View  05/21/2013   CLINICAL DATA:  Fever, small cell lung cancer, receiving chemotherapy  EXAM: CHEST  2 VIEW  COMPARISON:  05/07/2013 CT  FINDINGS: Stable spiculated nodule in the peripheral right upper lobe. Background emphysema noted. Chronic right middle lobe collapse along the right cardiac border. No edema or definite pneumonia. No effusion or pneumothorax. Trachea midline. Normal heart size.  IMPRESSION: Stable emphysema and  chronic right middle lobe collapse.  Persistent 15 mm right upper lobe spiculated nodule  No superimposed new or acute process   Electronically Signed   By: Daryll Brod M.D.   On: 05/21/2013 00:08   Ct Chest W Contrast  05/07/2013   CLINICAL DATA:  Restaging small cell lung cancer, right side.  EXAM: CT CHEST WITH CONTRAST  TECHNIQUE: Multidetector CT imaging of the chest was performed during intravenous contrast administration.  CONTRAST:  50mL OMNIPAQUE IOHEXOL 300 MG/ML  SOLN  COMPARISON:  CT SIM LUNG dated 04/21/2013; CT SIM LUNG dated 03/26/2013; NM PET IMAGE INITIAL (PI) SKULL BASE TO THIGH dated 02/05/2013; CT CHEST W/CM dated 03/19/2013; CT CHEST W/ CM dated 01/12/2013  FINDINGS: Right lower paratracheal node 1.5 cm in short axis on image 21 of series 2, formerly 1.9 cm. Calcifications along the inferior margin.  Calcified anterior mediastinal node 1.0 cm on image 21 of series 2, formerly 1.1 cm.  Indistinct subcarinal node short axis diameter 1.7 cm, formerly 1.8 cm. Mid esophageal wall thickening is suspected. Dense coronary artery atherosclerosis. Trace right pleural effusion, nonspecific for exudative versus transudative. Continued continued atelectasis in the right middle lobe. Tapered narrowing of right middle lobe bronchi. Stable small hypodense liver lesions. Incidental jejunal diverticulum seen on image 61 of series 2.  Right upper lobe pulmonary nodule 1.3 x 1.6 cm on image 16 of series 5, formerly 1.3 by 1.6 cm. Scattered stable interstitial accentuation in the right lung with a somewhat reticulonodular configuration. Emphysema is present. Coronary artery atherosclerosis.  Since the previous diagnostic CT of 03/19/2013, there is patent re-expansion at resolution of the airspace opacity in the right upper lobe, although this re-expansion was also  present on the 04/21/2013 radiation planning CT.  Stable mild prominence of right hilar nodal tissue with scattered calcifications.  IMPRESSION: 1.  Mild improvement in mediastinal and right hilar adenopathy. The right upper lobe spiculated pulmonary nodule is stable in size compared to the radiation therapy planning CT of 04/21/2013, but was obscured by surrounding airspace opacity on the prior diagnostic CT scan of 03/19/2013. 2. Emphysema. 3. Mid esophageal wall thickening - esophagitis not excluded. 4. Coronary artery atherosclerosis. 5. Trace right pleural effusion. 6. Continued atelectasis in the right middle lobe. 7. Stable reticulonodular interstitial accentuation in the lungs. 8. Emphysema.   Electronically Signed   By: Sherryl Barters M.D.   On: 05/07/2013 11:22        Teressa Lower, MD 05/21/13 [phone]

## 2013-05-21 NOTE — Progress Notes (Signed)
DIAGNOSIS: Limited stage small cell lung cancer diagnosed in October 2014.   PRIOR THERAPY: None   CURRENT THERAPY: Systemic chemotherapy with cisplatin at 60 mg per meter square given on day 1 and etoposide at 120 mg per meter squared given on days 1, 2 and 3 with Neulasta support given on day 4 every 3 weeks. A total 4-6 cycles are planned depending on response. Status post 5 cycles.   DISEASE STAGE: Limited stage  CHEMOTHERAPY INTENT: Control  CURRENT # OF CHEMOTHERAPY CYCLES: 5  CURRENT ANTIEMETICS: Compazine, Phenergan, dexamethasone, Zofran, Aloxi, Emend  CURRENT SMOKING STATUS: Former smoker, quit 01/30/2013  ORAL CHEMOTHERAPY AND CONSENT: n/a  CURRENT BISPHOSPHONATES USE: none  PAIN MANAGEMENT: none  NARCOTICS INDUCED CONSTIPATION: None  LIVING WILL AND CODE STATUS: Full Code   Subjective: This is a very pleasant 58 years old white female with limited stage small cell lung cancer currently undergoing systemic chemotherapy with cisplatin and etoposide status post 5 cycles. She also completed a course of concurrent radiation in [facility]. The patient was admitted yesterday with persistent nausea and vomiting and dehydration. She was also found to have neutropenic fever. She received IV hydration and nausea medicine at the [facility] before admission. She is feeling a little bit better today. She denied having any fever or chills. She has no chest pain but continues to have shortness of breath.  Objective: Vital signs in last 24 hours: Temp:  [98.1 F (36.7 C)-101 F (38.3 C)] 98.4 F (36.9 C) (01/30 0401) Pulse Rate:  [97-109] 97 (01/30 0401) Resp:  [15-18] 18 (01/30 0401) BP: (92-127)/(43-67) 117/64 mmHg (01/30 0401) SpO2:  [94 %-100 %] 96 % (01/30 0401) Weight:  [152 lb 4.8 oz (69.083 kg)-154 lb (69.854 kg)] 152 lb 4.8 oz (69.083 kg) (01/30 0401)  Intake/Output from previous day:   Intake/Output this shift: Total I/O In: -  Out: 700 [Urine:700]  General  appearance: alert, cooperative, fatigued and no distress Resp: clear to auscultation bilaterally Cardio: regular rate and rhythm, S1, S2 normal, no murmur, click, rub or gallop GI: soft, non-tender; bowel sounds normal; no masses,  no organomegaly Extremities: extremities normal, atraumatic, no cyanosis or edema  Lab Results:   Recent Labs  05/21/13 0035 05/21/13 0530  WBC 0.6* 0.7*  HGB 8.4* 8.8*  HCT 24.2* 24.7*  PLT 53* 43*   BMET  Recent Labs  05/21/13 0035 05/21/13 0530  NA 132* 133*  K 4.4 4.1  CL 93* 97  CO2 24 23  GLUCOSE 165* 137*  BUN 26* 23  CREATININE 1.31* 1.22*  CALCIUM 9.2 8.4    Studies/Results: Dg Chest 2 View  05/21/2013   CLINICAL DATA:  Fever, small cell lung cancer, receiving chemotherapy  EXAM: CHEST  2 VIEW  COMPARISON:  05/07/2013 CT  FINDINGS: Stable spiculated nodule in the peripheral right upper lobe. Background emphysema noted. Chronic right middle lobe collapse along the right cardiac border. No edema or definite pneumonia. No effusion or pneumothorax. Trachea midline. Normal heart size.  IMPRESSION: Stable emphysema and chronic right middle lobe collapse.  Persistent 15 mm right upper lobe spiculated nodule  No superimposed new or acute process   Electronically Signed   By: Daryll Brod M.D.   On: 05/21/2013 00:08    Medications: I have reviewed the patient's current medications.   Assessment/Plan: 1) limited stage small cell lung cancer: Currently undergoing systemic chemotherapy with cisplatin and etoposide status post 1 cycle. The patient has one more cycle left of this  regimen. Her treatment was concurrent with a course of radiotherapy which was completed 3 weeks ago. 2) neutropenic fever and pancytopenia: Secondary to her recent chemotherapy. The patient received Neulasta injection after her chemotherapy. Continue current antibiotics with cefepime. We'll continue to monitor the hemoglobin and platelet counts closely.  Parameters for  transfusion will be hemoglobin less than 8.0 G./DL or platelets count less than 20,000 unless the patient has any significant bleeding. 3) nausea and vomiting and dehydration: continue IV hydration and Zofran. Thank you for taking good care of of [name], I will continue to follow the patient with you and assist in her management.  LOS: 1 day    [name] K. 05/21/2013

## 2013-05-21 NOTE — ED Provider Notes (Signed)
Medical screening examination/treatment/procedure(s) were conducted as a shared visit with non-physician practitioner(s) and myself.  I personally evaluated the patient during the encounter.   patient presenting with persistent vomiting, fever and history of lung cancer with recent chemotherapy. Temperature 101. is tachycardic. Lungs sounds clear. Abdomen soft nontender  Evaluated with labs, urinalysis and imaging. Treated with IV fluids and Tylenol. Broad-spectrum antibiotics provided  MED admit   Teressa Lower, MD 05/21/13 [phone]

## 2013-05-21 NOTE — Care Management (Signed)
Patient was admitted 0 317 on 1/30 [name] is a 58 y.o. female with history of limited stage small cell lung cancer on chemotherapy and radiation, last chemotherapy last week since then patient has been having nausea vomiting. Denies any abdominal pain diarrhea. Patient says that her nausea vomiting has persisted throughout the week and was unable to eat and drink anything. In addition patient had retrosternal burning sensation. Also has chills-like sensation but denies any drenching sweats or fever. In the ER patient was found to be pancytopenic with a fever of 101F. Initially patient also was mildly hypotensive which improved with fluids. Patient has been admitted for febrile neutropenia. Patient denies any headache focal deficits blurred visions. Denies any shortness of breath or productive cough.   S; patient sleeping, no complaints  O;  cardio; regular rhythm and rate, negative murmurs rubs gallops, DP/PT pulse +1 bilateral  Pulmonary; clear to auscultation bilateral  Musculoskeletal; negative pedal edema, negative skin lesions, patient's care has fallen out secondary to chemotherapy  A/P  1. Febrile neutropenia - with fever and absolute neutrophil count less than 500 patient has been placed on cefepime. Since patient had hypotension on initial presentation vancomycin has been added. Follow blood cultures and closely follow cell count and differential. Neutropenic precautions. Check influenza PCR. 2. Nausea vomiting - resolved, patient able to consume clear liquid diet with advance in the a.m.. 3. Acute renal failure - probably from dehydration from nausea vomiting. Gently hydrate and closely for intake output and metabolic panel. 4. Small cell lung cancer - per oncology. 5. Pancytopenia secondary to chemotherapy - per oncology. 6. CAD - patient has been having some retrosternal burning sensation probably secondary to radiation but given the history of CAD I have ordered EKG and cardiac  markers. 7. History of mitral regurgitation. 8. Diabetes mellitus type 2 - patient has been placed on spelled scale coverage. 9. History of hypertension - since patient was hypotensive initially I'm holding off patient's Diovan. Patient states she has not been taking metoprolol though it is listed in the medication list. 10. COPD - presently not wheezing. 11.

## 2013-05-21 NOTE — Progress Notes (Signed)
INITIAL NUTRITION ASSESSMENT  DOCUMENTATION CODES Per approved criteria  -Non-severe (moderate) malnutrition in the context of chronic illness  Pt meets criteria for moderate MALNUTRITION in the context of chronic disease as evidenced by PO intake <75% est nutrition needs for > one month, and moderate muscle loss and subcutaneous body fat loss in occipital and temporal region.   INTERVENTION: -Recommend Resource Breeze po TID, each supplement provides 250 kcal and 9 grams of protein -Recommend ProStat BID -Recommend Beneprotein BID -Diet advancement per MD -Provided nutritional education materials for chemo/radition nutrition related side effects -Will continue to monitor  NUTRITION DIAGNOSIS: Inadequate oral intake related to n/v as evidenced by PO intake < 75% for > 7 days.   Goal: Pt to meet >/= 90% of their estimated nutrition needs    Monitor:  Diet advancement, total protein/energy intake, GI profile, labs, weights  Reason for Assessment: MST  58 y.o. female  Admitting Dx: Neutropenic fever  ASSESSMENT: [name] is a 58 y.o. female with history of limited stage small cell lung cancer on chemotherapy and radiation, last chemotherapy last week since then patient has been having nausea vomiting. Denies any abdominal pain diarrhea. Patient says that her nausea vomiting has persisted throughout the week and was unable to eat and drink anything. In addition patient had retrosternal burning sensation. Also has chills-like sensation but denies any drenching sweats or fever  -Pt reported experiencing decreased appetite during week of chemo and radiation treatments. Will then normally have minimal PO intake during week of receiving platelets d/t nausea. Third week is when pt's appetite fully returns and is able to tolerate all foods. -Weight fluctuates between 156-164 lbs, with it decreasing in two weeks of decreased intake and increasing the third week during returned  appetite -Pt has been unable to tolerate any foods for past 3 days. Diet for last week has consisted largely of Jellos, and some liquids d/t nausea -Has tried Ensure/Boost previously, but d/c supplement as it caused stomach pains -Pt also reported difficulty swallowing d/t burning sensation in throat, is unable to tolerate water. Pt can consume milk occasionally. Is also able to  eat small amount of fruit -Has not been able to tolerate texture and/or taste of most protein foods.  -Encouraged pt to try milkshakes, and to include peanut butter, cottage cheese, protein powder or yogurt to assist in meeting protein needs -Encouraged bland, cold foods to assist with nausea -NPO d/t nausea/vomiting. RN noted pt will likely be advanced to clear liquid diet pending MD assessment of pt  Nutrition Focused Physical Exam:  Subcutaneous Fat:  Orbital Region: moderate Upper Arm Region: moderate Thoracic and Lumbar Region: WNL  Muscle:  Temple Region: moderate Clavicle Bone Region: WNL Clavicle and Acromion Bone Region:moderate Scapular Bone Region: WNL Dorsal Hand: WNL Patellar Region: WNL  Anterior Thigh Region: WNL Posterior Calf Region: WNL  Edema: N/A    Height: Ht Readings from Last 1 Encounters:  05/21/13 5\' 6"  (1.676 m)    Weight: Wt Readings from Last 1 Encounters:  05/21/13 152 lb 4.8 oz (69.083 kg)    Ideal Body Weight: 130 lbs  % Ideal Body Weight: 117%  Wt Readings from Last 10 Encounters:  05/21/13 152 lb 4.8 oz (69.083 kg)  05/10/13 157 lb 12.8 oz (71.578 kg)  03/22/13 165 lb 14.4 oz (75.252 kg)  03/01/13 162 lb 3.2 oz (73.573 kg)  02/02/13 159 lb 8 oz (72.349 kg)  01/25/13 161 lb 8 oz (73.256 kg)  11/28/08 168 lb (76.204  kg)    Usual Body Weight: 156-164 lbs  % Usual Body Weight: 95%  BMI:  Body mass index is 24.59 kg/(m^2).  Estimated Nutritional Needs: Kcal: 2000-2200 Protein: 85-95 gram Fluid: 2400 ml/daily  Skin: WDL  Diet Order: Clear  Liquid  EDUCATION NEEDS: -Education needs addressed   Intake/Output Summary (Last 24 hours) at 05/21/13 1221 Last data filed at 05/21/13 0748  Gross per 24 hour  Intake      0 ml  Output    700 ml  Net   -700 ml    Last BM: 1/30  Labs:   Recent Labs Lab 05/21/13 0035 05/21/13 0530  NA 132* 133*  K 4.4 4.1  CL 93* 97  CO2 24 23  BUN 26* 23  CREATININE 1.31* 1.22*  CALCIUM 9.2 8.4  GLUCOSE 165* 137*    CBG (last 3)  No results found for this basename: GLUCAP,  in the last 72 hours  Scheduled Meds: . aspirin  81 mg Oral QHS  . ceFEPime (MAXIPIME) IV  2 g Intravenous Q12H  . insulin aspart  0-9 Units Subcutaneous TID WC  . isosorbide mononitrate  30 mg Oral Daily  . loratadine  10 mg Oral Daily  . magnesium oxide  400 mg Oral TID  . simvastatin  10 mg Oral q1800  . sucralfate  1 g Oral TID WC & HS    Continuous Infusions: . sodium chloride 100 mL/hr at 05/21/13 0500    Past Medical History  Diagnosis Date  . Tobacco abuse   . Diabetes mellitus   . Mitral regurgitation   . Hypertension   . Dyslipidemia   . Coronary artery disease   . History of echocardiogram 7/10    EF 50-55%; inferior hypokinesis; RV function mild decreased  . ACE-inhibitor cough   . Multiple allergies     Penicillin and mushrooms  . Cancer     lung ca    Past Surgical History  Procedure Laterality Date  . Video bronchoscopy Bilateral 01/26/2013    Procedure: VIDEO BRONCHOSCOPY WITHOUT FLUORO;  Surgeon: Tanda Rockers, MD;  Location: [facility];  Service: Cardiopulmonary;  Laterality: Bilateral;    [place] LDN Clinical Dietitian [phone]

## 2013-05-21 NOTE — H&P (Signed)
[facility] History and Physical  [name] KDT:[id number] DOB: [birth year] DOA: 05/20/2013  Referring physician: ER physician. PCP: No PCP Per Patient  Specialists: Dr. Julien Nordmann. Oncologist.  Chief Complaint: Nausea vomiting.  HPI: [name] is a 58 y.o. female with history of limited stage small cell lung cancer on chemotherapy and radiation, last chemotherapy last week since then patient has been having nausea vomiting. Denies any abdominal pain diarrhea. Patient says that her nausea vomiting has persisted throughout the week and was unable to eat and drink anything. In addition patient had retrosternal burning sensation. Also has chills-like sensation but denies any drenching sweats or fever. In the ER patient was found to be pancytopenic with a fever of 101F. Initially patient also was mildly hypotensive which improved with fluids. Patient has been admitted for febrile neutropenia. Patient denies any headache focal deficits blurred visions. Denies any shortness of breath or productive cough.  Review of Systems: As presented in the history of presenting illness, rest negative.  Past Medical History  Diagnosis Date  . Tobacco abuse   . Diabetes mellitus   . Mitral regurgitation   . Hypertension   . Dyslipidemia   . Coronary artery disease   . History of echocardiogram 7/10    EF 50-55%; inferior hypokinesis; RV function mild decreased  . ACE-inhibitor cough   . Multiple allergies     Penicillin and mushrooms  . Cancer     lung ca   Past Surgical History  Procedure Laterality Date  . Video bronchoscopy Bilateral 01/26/2013    Procedure: VIDEO BRONCHOSCOPY WITHOUT FLUORO;  Surgeon: Tanda Rockers, MD;  Location: [facility];  Service: Cardiopulmonary;  Laterality: Bilateral;   Social History:  reports that she quit smoking about 3 months ago. Her smoking use included Cigarettes. She has a 60 pack-year smoking history. She has never used smokeless tobacco. She  reports that she does not drink alcohol or use illicit drugs. Where does patient live home. Can patient participate in ADLs? Yes.  Allergies  Allergen Reactions  . Codeine Nausea And Vomiting  . Penicillins Itching    Family History:  Family History  Problem Relation Age of Onset  . Stroke Mother   . Coronary artery disease Father       Prior to Admission medications   Medication Sig Start Date End Date Taking? Authorizing Provider  albuterol (PROVENTIL HFA;VENTOLIN HFA) 108 (90 BASE) MCG/ACT inhaler Inhale 2 puffs into the lungs every 6 (six) hours as needed for wheezing or shortness of breath. 03/17/13  Yes Curt Bears, MD  Alum & Mag Hydroxide-Simeth (MAGIC MOUTHWASH) SOLN Take 5 mLs by mouth 4 (four) times daily as needed for mouth pain.   Yes Historical Provider, MD  aspirin 81 MG tablet Take 81 mg by mouth at bedtime.    Yes Historical Provider, MD  B Complex-C (SUPER B COMPLEX PO) Take 1 tablet by mouth daily.   Yes Historical Provider, MD  isosorbide mononitrate (IMDUR) 30 MG 24 hr tablet Take 30 mg by mouth daily.     Yes Historical Provider, MD  loratadine (CLARITIN) 10 MG tablet Take 10 mg by mouth daily.   Yes Historical Provider, MD  LORazepam (ATIVAN) 0.5 MG tablet 1 tablet by mouth or sublingually every 8 hours as needed for nausea uncontrollerd by Phenergan or Zofran 05/17/13  Yes Curt Bears, MD  lovastatin (MEVACOR) 20 MG tablet Take 20 mg by mouth at bedtime.   Yes Historical Provider, MD  magnesium oxide (MAG-OX) 400 (241.3  MG) MG tablet Take 1 tablet (400 mg total) by mouth 3 (three) times daily. 05/10/13  Yes Curt Bears, MD  metFORMIN (GLUCOPHAGE) 850 MG tablet Take 850 mg by mouth 2 (two) times daily with a meal.   Yes Historical Provider, MD  ondansetron (ZOFRAN) 8 MG tablet Take 1 tablet (8 mg total) by mouth every 8 (eight) hours as needed for nausea. 04/09/13  Yes Curt Bears, MD  oxyCODONE-acetaminophen (PERCOCET/ROXICET) 5-325 MG per tablet  Take 1 tablet by mouth every 4 (four) hours as needed for severe pain.   Yes Historical Provider, MD  promethazine (PHENERGAN) 25 MG tablet TAKE ONE TABLET BY MOUTH EVERY 6 HOURS AS NEEDED 05/17/13  Yes Curt Bears, MD  sucralfate (CARAFATE) 1 GM/10ML suspension Take 1 g by mouth 4 (four) times daily -  with meals and at bedtime.   Yes Historical Provider, MD  valsartan (DIOVAN) 160 MG tablet Take 160 mg by mouth daily.     Yes Historical Provider, MD  metoprolol (TOPROL-XL) 50 MG 24 hr tablet Take 50 mg by mouth daily.      Historical Provider, MD    Physical Exam: Filed Vitals:   05/20/13 2320 05/21/13 0229 05/21/13 0310 05/21/13 0401  BP: 127/67  92/43 117/64  Pulse: 109  100 97  Temp: 101 F (38.3 C) 98.9 F (37.2 C)  98.4 F (36.9 C)  TempSrc: Oral Oral  Oral  Resp: 15  16 18   Height: 5\' 6"  (1.676 m)   5\' 6"  (1.676 m)  Weight: 69.854 kg (154 lb)   69.083 kg (152 lb 4.8 oz)  SpO2: 97%  94% 96%     General:  Well-developed and nourished.  Eyes: Anicteric no pallor.  ENT: No discharge from the ears eyes nose mouth.  Neck: No mass felt.  Cardiovascular: S1-S2 heard.  Respiratory: No rhonchi or crepitations.  Abdomen: Soft nontender bowel sounds present. No guarding or rigidity.  Skin: No rash.  Musculoskeletal: No edema.  Psychiatric: Appears normal.  Neurologic: Alert awake oriented to time place and person. Moves all extremities.  Labs on Admission:  Basic Metabolic Panel:  Recent Labs Lab 05/21/13 0035  NA 132*  K 4.4  CL 93*  CO2 24  GLUCOSE 165*  BUN 26*  CREATININE 1.31*  CALCIUM 9.2   Liver Function Tests:  Recent Labs Lab 05/21/13 0035  AST 11  ALT 11  ALKPHOS 69  BILITOT 0.5  PROT 7.3  ALBUMIN 3.2*   No results found for this basename: LIPASE, AMYLASE,  in the last 168 hours No results found for this basename: AMMONIA,  in the last 168 hours CBC:  Recent Labs Lab 05/21/13 0035  WBC 0.6*  NEUTROABS 0.1*  HGB 8.4*  HCT  24.2*  MCV 93.1  PLT 53*   Cardiac Enzymes: No results found for this basename: CKTOTAL, CKMB, CKMBINDEX, TROPONINI,  in the last 168 hours  BNP (last 3 results) No results found for this basename: PROBNP,  in the last 8760 hours CBG: No results found for this basename: GLUCAP,  in the last 168 hours  Radiological Exams on Admission: Dg Chest 2 View  05/21/2013   CLINICAL DATA:  Fever, small cell lung cancer, receiving chemotherapy  EXAM: CHEST  2 VIEW  COMPARISON:  05/07/2013 CT  FINDINGS: Stable spiculated nodule in the peripheral right upper lobe. Background emphysema noted. Chronic right middle lobe collapse along the right cardiac border. No edema or definite pneumonia. No effusion or pneumothorax. Trachea midline. Normal  heart size.  IMPRESSION: Stable emphysema and chronic right middle lobe collapse.  Persistent 15 mm right upper lobe spiculated nodule  No superimposed new or acute process   Electronically Signed   By: Daryll Brod M.D.   On: 05/21/2013 00:08     Assessment/Plan Principal Problem:   Neutropenic fever Active Problems:   DM   MITRAL REGURGITATION   CAD   Small cell lung carcinoma   Intractable vomiting   Febrile neutropenia   1. Febrile neutropenia - with fever and absolute neutrophil count less than 500 patient has been placed on cefepime. Since patient had hypotension on initial presentation vancomycin has been added. Follow blood cultures and closely follow cell count and differential. Neutropenic precautions. Check influenza PCR. 2. Nausea vomiting - probably chemotherapy related. If nausea vomiting persist may consider MRI brain to rule out metastasis. Patient is presently nonfocal. 3. Acute renal failure - probably from dehydration from nausea vomiting. Gently hydrate and closely for intake output and metabolic panel. 4. Small cell lung cancer - per oncology. 5. Pancytopenia secondary to chemotherapy - per oncology. 6. CAD - patient has been having some  retrosternal burning sensation probably secondary to radiation but given the history of CAD I have ordered EKG and cardiac markers. 7. History of mitral regurgitation. 8. Diabetes mellitus type 2 - patient has been placed on spelled scale coverage. 9. History of hypertension - since patient was hypotensive initially I'm holding off patient's Diovan. Patient states she has not been taking metoprolol though it is listed in the medication list. 10. COPD - presently not wheezing.  I have reviewed patient's old charts and labs.  Code Status: Full code.  Family Communication: None.  Disposition Plan: Admit to inpatient.    Ryo Klang N. [facility] Pager [phone].  If 7PM-7AM, please contact night-coverage [email or web address] Password [facility] 05/21/2013, 4:57 AM

## 2013-05-21 NOTE — ED Notes (Signed)
Unable to obtain enough blood for [facility] x 2 Dr. Marnette Burgess present at bedside while attempting to get labs and is aware Per MD, OK to send [facility] x 1

## 2013-05-21 NOTE — Progress Notes (Addendum)
ANTIBIOTIC CONSULT NOTE - INITIAL  Pharmacy Consult for Vancomycin/Cefepime Indication: Ca pt/recent chemo/unclear source  Allergies  Allergen Reactions  . Codeine Nausea And Vomiting  . Penicillins Itching    Patient Measurements: Height: 5\' 6"  (167.6 cm) Weight: 154 lb (69.854 kg) IBW/kg (Calculated) : 59.3   Vital Signs: Temp: 101 F (38.3 C) (01/29 2320) Temp src: Oral (01/29 2320) BP: 127/67 mmHg (01/29 2320) Pulse Rate: 109 (01/29 2320) Intake/Output from previous day:   Intake/Output from this shift:    Labs:  Recent Labs  05/21/13 0035  CREATININE 1.31*   Estimated Creatinine Clearance: 43.8 ml/min (by C-G formula based on Cr of 1.31). No results found for this basename: VANCOTROUGH, VANCOPEAK, VANCORANDOM, GENTTROUGH, GENTPEAK, GENTRANDOM, TOBRATROUGH, TOBRAPEAK, TOBRARND, AMIKACINPEAK, AMIKACINTROU, AMIKACIN,  in the last 72 hours   Microbiology: No results found for this or any previous visit (from the past 720 hour(s)).  Medical History: Past Medical History  Diagnosis Date  . Tobacco abuse   . Diabetes mellitus   . Mitral regurgitation   . Hypertension   . Dyslipidemia   . Coronary artery disease   . History of echocardiogram 7/10    EF 50-55%; inferior hypokinesis; RV function mild decreased  . ACE-inhibitor cough   . Multiple allergies     Penicillin and mushrooms  . Cancer     lung ca    Medications:  Scheduled:  . vancomycin  750 mg Intravenous Q12H   Infusions:  . levofloxacin (LEVAQUIN) IV 750 mg (05/21/13 0101)  . sodium chloride 1,000 mL (05/21/13 0042)   Assessment: 58 yo with hx of RUL small cell lung Ca, on chemo presents with vomiting/fever.  Empiric Vancomycin and Cefepime for febrile neutropenia.  Goal of Therapy:  Vancomycin trough level 15-20 mcg/ml  Plan:   Vancomycin 750mg  IV q12h  And Cefepime 2gm IV q12h  F/U SCr/levels/cultures as needed  Lawana Pai R 05/21/2013,1:35 AM

## 2013-05-21 NOTE — ED Notes (Signed)
Unsuccessful lab draw by this writer. RN made aware. 

## 2013-05-21 NOTE — ED Provider Notes (Signed)
[name] S 1:00 AM patient discussed in sign out. Patient with history of right upper lung small cell carcinoma presenting with continued episodes of nausea and vomiting as well as fever. Lab testing pending. Broad-spectrum antibiotics ordered.   Lab testing showing neutropenia. Patient does report some subjective improvement of overall symptoms. Will plan to consult internal medicine for possible admission.  2:50 AM spoke with [facility]. They will see patient and admit. Would like temporary orders for med-surg bed.  Martie Lee, PA-C 05/21/13 [phone]

## 2013-05-22 DIAGNOSIS — J449 Chronic obstructive pulmonary disease, unspecified: Secondary | ICD-10-CM

## 2013-05-22 DIAGNOSIS — C3410 Malignant neoplasm of upper lobe, unspecified bronchus or lung: Secondary | ICD-10-CM

## 2013-05-22 DIAGNOSIS — R1115 Cyclical vomiting syndrome unrelated to migraine: Secondary | ICD-10-CM

## 2013-05-22 DIAGNOSIS — C3490 Malignant neoplasm of unspecified part of unspecified bronchus or lung: Secondary | ICD-10-CM

## 2013-05-22 DIAGNOSIS — I10 Essential (primary) hypertension: Secondary | ICD-10-CM

## 2013-05-22 DIAGNOSIS — E119 Type 2 diabetes mellitus without complications: Secondary | ICD-10-CM

## 2013-05-22 DIAGNOSIS — E46 Unspecified protein-calorie malnutrition: Secondary | ICD-10-CM

## 2013-05-22 DIAGNOSIS — R222 Localized swelling, mass and lump, trunk: Secondary | ICD-10-CM

## 2013-05-22 LAB — GLUCOSE, CAPILLARY
GLUCOSE-CAPILLARY: 122 mg/dL — AB (ref 70–99)
GLUCOSE-CAPILLARY: 97 mg/dL (ref 70–99)
GLUCOSE-CAPILLARY: 106 mg/dL — AB (ref 70–99)
Glucose-Capillary: 180 mg/dL — ABNORMAL HIGH (ref 70–99)

## 2013-05-22 MED ORDER — PANTOPRAZOLE SODIUM 40 MG IV SOLR
40.0000 mg | Freq: Every day | INTRAVENOUS | Status: DC
  Administered 2013-05-22: 40 mg via INTRAVENOUS
  Filled 2013-05-22 (×2): qty 40

## 2013-05-22 MED ORDER — POLYETHYLENE GLYCOL 3350 17 G PO PACK
17.0000 g | PACK | Freq: Every day | ORAL | Status: DC
  Administered 2013-05-22 – 2013-05-23 (×2): 17 g via ORAL
  Filled 2013-05-22 (×2): qty 1

## 2013-05-22 MED ORDER — VANCOMYCIN HCL IN DEXTROSE 750-5 MG/150ML-% IV SOLN
750.0000 mg | Freq: Two times a day (BID) | INTRAVENOUS | Status: DC
  Administered 2013-05-22 – 2013-05-23 (×3): 750 mg via INTRAVENOUS
  Filled 2013-05-22 (×4): qty 150

## 2013-05-22 NOTE — Progress Notes (Signed)
[name]   DOB:1955-11-22   WU#:[id number]   VOZ#:[id number]  Subjective: this morning patient is feeling well. She is beginning to get her appetite back. She has been afebrile vitals are stable. She denies having any nausea or vomiting no fevers or chills.   Objective:  Filed Vitals:   05/22/13 0532  BP: 121/62  Pulse: 94  Temp: 98.9 F (37.2 C)  Resp: 18    Body mass index is 24.59 kg/(m^2).  Intake/Output Summary (Last 24 hours) at 05/22/13 0742 Last data filed at 05/21/13 2147  Gross per 24 hour  Intake    420 ml  Output   1400 ml  Net   -980 ml     Sclerae unicteric  Oropharynx clear  No peripheral adenopathy  Lungs clear -- no rales or rhonchi  Heart regular rate and rhythm  Abdomen benign  MSK no focal spinal tenderness, no peripheral edema  Neuro nonfocal  Breast exam: deferred  CBG (last 3)   Recent Labs  05/21/13 2144 05/22/13 0727  GLUCAP 100* 122*     Labs:  Lab Results  Component Value Date   WBC 0.7* 05/21/2013   HGB 8.8* 05/21/2013   HCT 24.7* 05/21/2013   MCV 91.8 05/21/2013   PLT 43* 05/21/2013   NEUTROABS 0.1* 05/21/2013    Urine Studies No results found for this basename: UACOL, UAPR, USPG, UPH, UTP, UGL, UKET, UBIL, UHGB, UNIT, UROB, ULEU, UEPI, UWBC, URBC, UBAC, CAST, CRYS, UCOM, BILUA,  in the last 72 hours  Basic Metabolic Panel:  Recent Labs Lab 05/21/13 0035 05/21/13 0530  NA 132* 133*  K 4.4 4.1  CL 93* 97  CO2 24 23  GLUCOSE 165* 137*  BUN 26* 23  CREATININE 1.31* 1.22*  CALCIUM 9.2 8.4   GFR Estimated Creatinine Clearance: 47.1 ml/min (by C-G formula based on Cr of 1.22). Liver Function Tests:  Recent Labs Lab 05/21/13 0035 05/21/13 0530  AST 11 10  ALT 11 10  ALKPHOS 69 60  BILITOT 0.5 0.4  PROT 7.3 6.4  ALBUMIN 3.2* 2.7*   No results found for this basename: LIPASE, AMYLASE,  in the last 168 hours No results found for this basename: AMMONIA,  in the last 168 hours Coagulation profile No results found  for this basename: INR, PROTIME,  in the last 168 hours  CBC:  Recent Labs Lab 05/21/13 0035 05/21/13 0530  WBC 0.6* 0.7*  NEUTROABS 0.1* 0.1*  HGB 8.4* 8.8*  HCT 24.2* 24.7*  MCV 93.1 91.8  PLT 53* 43*   Cardiac Enzymes:  Recent Labs Lab 05/21/13 0530  TROPONINI <0.30   BNP: No components found with this basename: POCBNP,  CBG:  Recent Labs Lab 05/21/13 2144 05/22/13 0727  GLUCAP 100* 122*   D-Dimer No results found for this basename: DDIMER,  in the last 72 hours Hgb A1c No results found for this basename: HGBA1C,  in the last 72 hours Lipid Profile No results found for this basename: CHOL, HDL, LDLCALC, TRIG, CHOLHDL, LDLDIRECT,  in the last 72 hours Thyroid function studies No results found for this basename: TSH, T4TOTAL, FREET3, T3FREE, THYROIDAB,  in the last 72 hours Anemia work up No results found for this basename: VITAMINB12, FOLATE, FERRITIN, TIBC, IRON, RETICCTPCT,  in the last 72 hours Microbiology No results found for this or any previous visit (from the past 240 hour(s)).    Studies:  Dg Chest 2 View  05/21/2013   CLINICAL DATA:  Fever, small cell lung cancer,  receiving chemotherapy  EXAM: CHEST  2 VIEW  COMPARISON:  05/07/2013 CT  FINDINGS: Stable spiculated nodule in the peripheral right upper lobe. Background emphysema noted. Chronic right middle lobe collapse along the right cardiac border. No edema or definite pneumonia. No effusion or pneumothorax. Trachea midline. Normal heart size.  IMPRESSION: Stable emphysema and chronic right middle lobe collapse.  Persistent 15 mm right upper lobe spiculated nodule  No superimposed new or acute process   Electronically Signed   By: Daryll Brod M.D.   On: 05/21/2013 00:08    Assessment/Plan: 58 y.o. with  #1 Limited stage small cell lung cancer: Patient has been receiving chemotherapy consisting of cis-platinum and etoposide. Thus far she has received 5 cycles. She's tolerated it well except for  development of neutropenia.  #2 febrile neutropenia: Patient is on broad-spectrum antibiotics and she has defervesced. Her white count is slowly coming up  #3caloric protein malnutrition: Patient is being encouraged to eat more now that she is feeling better.  #4 disposition: Patient to be discharged when clinically stable and her counts have improved.  #5 labs: We will obtain a CBC and to be not on 05/23/2013    Marcy Panning 05/22/2013

## 2013-05-22 NOTE — Progress Notes (Signed)
[facility] PROGRESS NOTE  [name] DBZ:[id number] DOB: Jul 30, 1955 DOA: 05/20/2013 PCP: No PCP Per Patient  Assessment/Plan:  Febrile neutropenia  - Patient has been afebrile however continued to have Absolute neutrophil count less than 500 -Continue cefepime and vancomycin   -Influenza panel negative   Nausea vomiting  - resolved, -Continue carb modified diet   Acute renal failure -Continue hydrate and monitor closely   Small cell lung cancer  - per oncology. Pancytopenia secondary to chemotherapy   CAD  -Stable troponin x1 negative  Diabetes mellitus type 2  - Continue on sensitive SSI   COPD  - Stable   Code Status: Full Family Communication: Husband present for discussion of plan of care Disposition Plan: Per oncology   Consultants: Dr. Curt Bears (oncology)   Procedures: CXR 05/21/2013 Stable emphysema and chronic right middle lobe collapse.  Persistent 15 mm right upper lobe spiculated nodule  No superimposed new or acute process   Antibiotics: Cefepime 1/30 Vancomycin 1/30   HPI/Subjective: [name] is a 58 y.o. WF PMHx CAD, diabetes, HTN, HLD, COPD, limited stage small cell lung cancer on chemotherapy and radiation, last chemotherapy last week since then patient has been having nausea vomiting. Denies any abdominal pain diarrhea. Patient says that her nausea vomiting has persisted throughout the week and was unable to eat and drink anything. In addition patient had retrosternal burning sensation. Also has chills-like sensation but denies any drenching sweats or fever. In the ER patient was found to be pancytopenic with a fever of 101F. Initially patient also was mildly hypotensive which improved with fluids. Patient has been admitted for febrile neutropenia. Patient denies any headache focal deficits blurred visions. Denies any shortness of breath or productive cough. 1/31 patient sitting in bed states feels well has been able to  eat today all meals without any adverse affects   Objective: Filed Vitals:   05/21/13 0401 05/21/13 1439 05/21/13 2146 05/22/13 0532  BP: 117/64 114/63 105/65 121/62  Pulse: 97 97 98 94  Temp: 98.4 F (36.9 C) 98.8 F (37.1 C) 99.1 F (37.3 C) 98.9 F (37.2 C)  TempSrc: Oral Oral Oral Oral  Resp: 18 18 18 18   Height: 5\' 6"  (1.676 m)     Weight: 69.083 kg (152 lb 4.8 oz)     SpO2: 96% 96% 97% 97%    Intake/Output Summary (Last 24 hours) at 05/22/13 0809 Last data filed at 05/21/13 2147  Gross per 24 hour  Intake    420 ml  Output    700 ml  Net   -280 ml   Filed Weights   05/20/13 2320 05/21/13 0401  Weight: 69.854 kg (154 lb) 69.083 kg (152 lb 4.8 oz)    Exam:   General:  A./O. x4, NAD  Cardiovascular: Regular rate, negative murmurs rubs or gallops, DP/PT pulse +1  Respiratory: Clear to auscultation bilateral   Abdomen: Soft, nontender, nondistended, plus bowel sound  Musculoskeletal: Negative pedal edema   Data Reviewed: Basic Metabolic Panel:  Recent Labs Lab 05/21/13 0035 05/21/13 0530  NA 132* 133*  K 4.4 4.1  CL 93* 97  CO2 24 23  GLUCOSE 165* 137*  BUN 26* 23  CREATININE 1.31* 1.22*  CALCIUM 9.2 8.4   Liver Function Tests:  Recent Labs Lab 05/21/13 0035 05/21/13 0530  AST 11 10  ALT 11 10  ALKPHOS 69 60  BILITOT 0.5 0.4  PROT 7.3 6.4  ALBUMIN 3.2* 2.7*   No results found for this basename: LIPASE,  AMYLASE,  in the last 168 hours No results found for this basename: AMMONIA,  in the last 168 hours CBC:  Recent Labs Lab 05/21/13 0035 05/21/13 0530  WBC 0.6* 0.7*  NEUTROABS 0.1* 0.1*  HGB 8.4* 8.8*  HCT 24.2* 24.7*  MCV 93.1 91.8  PLT 53* 43*   Cardiac Enzymes:  Recent Labs Lab 05/21/13 0530  TROPONINI <0.30   BNP (last 3 results) No results found for this basename: PROBNP,  in the last 8760 hours CBG:  Recent Labs Lab 05/21/13 2144 05/22/13 0727  GLUCAP 100* 122*    No results found for this or any previous  visit (from the past 240 hour(s)).   Studies: Dg Chest 2 View  05/21/2013   CLINICAL DATA:  Fever, small cell lung cancer, receiving chemotherapy  EXAM: CHEST  2 VIEW  COMPARISON:  05/07/2013 CT  FINDINGS: Stable spiculated nodule in the peripheral right upper lobe. Background emphysema noted. Chronic right middle lobe collapse along the right cardiac border. No edema or definite pneumonia. No effusion or pneumothorax. Trachea midline. Normal heart size.  IMPRESSION: Stable emphysema and chronic right middle lobe collapse.  Persistent 15 mm right upper lobe spiculated nodule  No superimposed new or acute process   Electronically Signed   By: Daryll Brod M.D.   On: 05/21/2013 00:08    Scheduled Meds: . aspirin  81 mg Oral QHS  . ceFEPime (MAXIPIME) IV  2 g Intravenous Q12H  . feeding supplement (PRO-STAT SUGAR FREE 64)  30 mL Oral BID WC  . feeding supplement (RESOURCE BREEZE)  1 Container Oral TID BM  . insulin aspart  0-9 Units Subcutaneous TID WC  . isosorbide mononitrate  30 mg Oral Daily  . loratadine  10 mg Oral Daily  . magnesium oxide  400 mg Oral TID  . protein supplement  6 g Oral BID WC  . simvastatin  10 mg Oral q1800  . sucralfate  1 g Oral TID WC & HS   Continuous Infusions:   Principal Problem:   Neutropenic fever Active Problems:   DM   MITRAL REGURGITATION   CAD   Small cell lung carcinoma   Intractable vomiting   Febrile neutropenia    Time spent: 35 minutes   Rossie Scarfone, J  [facility] Pager [phone]. If 7PM-7AM, please contact night-coverage at [email or web address], password [facility] 05/22/2013, 8:09 AM  LOS: 2 days

## 2013-05-22 NOTE — Progress Notes (Signed)
ANTIBIOTIC CONSULT NOTE - INITIAL  Pharmacy Consult for Vancomycin & Cefepime Indication: Febrile neutropenia  Allergies  Allergen Reactions  . Codeine Nausea And Vomiting  . Penicillins Itching   Patient Measurements: Height: 5\' 6"  (167.6 cm) Weight: 152 lb 4.8 oz (69.083 kg) IBW/kg (Calculated) : 59.3  Vital Signs: Temp: 98.9 F (37.2 C) (01/31 0532) Temp src: Oral (01/31 0532) BP: 121/62 mmHg (01/31 0532) Pulse Rate: 94 (01/31 0532) Intake/Output from previous day: 01/30 0701 - 01/31 0700 In: 1890 [P.O.:420; IV Piggyback:100] Out: 1850 [Urine:1850] Intake/Output from this shift:   Labs:  Recent Labs  05/21/13 0035 05/21/13 0530  WBC 0.6* 0.7*  HGB 8.4* 8.8*  PLT 53* 43*  CREATININE 1.31* 1.22*   Estimated Creatinine Clearance: 47.1 ml/min (by C-G formula based on Cr of 1.22). No results found for this basename: VANCOTROUGH, VANCOPEAK, VANCORANDOM, GENTTROUGH, GENTPEAK, GENTRANDOM, TOBRATROUGH, TOBRAPEAK, TOBRARND, AMIKACINPEAK, AMIKACINTROU, AMIKACIN,  in the last 72 hours   Microbiology: No results found for this or any previous visit (from the past 720 hour(s)).  Medical History: Past Medical History  Diagnosis Date  . Tobacco abuse   . Diabetes mellitus   . Mitral regurgitation   . Hypertension   . Dyslipidemia   . Coronary artery disease   . History of echocardiogram 7/10    EF 50-55%; inferior hypokinesis; RV function mild decreased  . ACE-inhibitor cough   . Multiple allergies     Penicillin and mushrooms  . Cancer     lung ca   Medications:  Scheduled:  . aspirin  81 mg Oral QHS  . ceFEPime (MAXIPIME) IV  2 g Intravenous Q12H  . feeding supplement (PRO-STAT SUGAR FREE 64)  30 mL Oral BID WC  . feeding supplement (RESOURCE BREEZE)  1 Container Oral TID BM  . insulin aspart  0-9 Units Subcutaneous TID WC  . isosorbide mononitrate  30 mg Oral Daily  . loratadine  10 mg Oral Daily  . magnesium oxide  400 mg Oral TID  . protein supplement  6  g Oral BID WC  . simvastatin  10 mg Oral q1800  . sucralfate  1 g Oral TID WC & HS  . vancomycin  750 mg Intravenous Q12H   Anti-infectives   Start     Dose/Rate Route Frequency Ordered Stop   05/22/13 1000  vancomycin (VANCOCIN) IVPB 750 mg/150 ml premix     750 mg 150 mL/hr over 60 Minutes Intravenous Every 12 hours 05/22/13 0856     05/21/13 0600  ceFEPIme (MAXIPIME) 2 g in dextrose 5 % 50 mL IVPB     2 g 100 mL/hr over 30 Minutes Intravenous Every 12 hours 05/21/13 0508     05/21/13 0030  levofloxacin (LEVAQUIN) IVPB 750 mg     750 mg 100 mL/hr over 90 Minutes Intravenous  Once 05/21/13 0017 05/21/13 0244   05/21/13 0030  vancomycin (VANCOCIN) IVPB 750 mg/150 ml premix  Status:  Discontinued     750 mg 150 mL/hr over 60 Minutes Intravenous Every 12 hours 05/21/13 0020 05/21/13 0456     Assessment: 26 yoF with febrile neutropenia. Hx of SCLC, recent chemotherapy Cycle 5 of Cisplatin/Etoposide completed 1/22. Admit with fever/vomiting 1/30. Cefepime and Vancomycin begun 1/30, received one dose Vancomycin 750mg  on 1/30 @ 0030, then order discontinued. Received one dose Levaquin 1/30  Cefepime 2gm q12 from 1/30  Vancomycin 750mg  q12 continue from 1/31 am  Goal of Therapy:  Vancomycin trough level 15-20 mcg/ml  Plan:  Continue Cefepime 2gm q12  Vancomycin resume 750mg  q12  Monitor cultures, clinical course, trough if necessary.  Minda Ditto PharmD Pager [phone] 05/22/2013, 9:21 AM

## 2013-05-23 DIAGNOSIS — D6959 Other secondary thrombocytopenia: Secondary | ICD-10-CM

## 2013-05-23 LAB — CBC WITH DIFFERENTIAL/PLATELET
BASOS PCT: 2 % — AB (ref 0–1)
Basophils Absolute: 0.1 10*3/uL (ref 0.0–0.1)
Eosinophils Absolute: 0.1 10*3/uL (ref 0.0–0.7)
Eosinophils Relative: 1 % (ref 0–5)
HCT: 22.6 % — ABNORMAL LOW (ref 36.0–46.0)
HEMOGLOBIN: 8.3 g/dL — AB (ref 12.0–15.0)
LYMPHS PCT: 13 % (ref 12–46)
Lymphs Abs: 0.7 10*3/uL (ref 0.7–4.0)
MCH: 33.2 pg (ref 26.0–34.0)
MCHC: 36.7 g/dL — AB (ref 30.0–36.0)
MCV: 90.4 fL (ref 78.0–100.0)
Monocytes Absolute: 0.9 10*3/uL (ref 0.1–1.0)
Monocytes Relative: 17 % — ABNORMAL HIGH (ref 3–12)
NEUTROS PCT: 67 % (ref 43–77)
Neutro Abs: 3.4 10*3/uL (ref 1.7–7.7)
Platelets: 34 10*3/uL — ABNORMAL LOW (ref 150–400)
RBC: 2.5 MIL/uL — AB (ref 3.87–5.11)
RDW: 14.3 % (ref 11.5–15.5)
WBC: 5.2 10*3/uL (ref 4.0–10.5)

## 2013-05-23 LAB — COMPREHENSIVE METABOLIC PANEL
ALBUMIN: 2.4 g/dL — AB (ref 3.5–5.2)
ALT: 13 U/L (ref 0–35)
AST: 14 U/L (ref 0–37)
Alkaline Phosphatase: 51 U/L (ref 39–117)
BILIRUBIN TOTAL: 0.2 mg/dL — AB (ref 0.3–1.2)
BUN: 9 mg/dL (ref 6–23)
CALCIUM: 8.2 mg/dL — AB (ref 8.4–10.5)
CO2: 23 mEq/L (ref 19–32)
Chloride: 101 mEq/L (ref 96–112)
Creatinine, Ser: 1.08 mg/dL (ref 0.50–1.10)
GFR calc Af Amer: 64 mL/min — ABNORMAL LOW (ref >90)
GFR calc non Af Amer: 55 mL/min — ABNORMAL LOW (ref >90)
Glucose, Bld: 96 mg/dL (ref 70–99)
Potassium: 3.4 mEq/L — ABNORMAL LOW (ref 3.7–5.3)
Sodium: 135 mEq/L — ABNORMAL LOW (ref 137–147)
TOTAL PROTEIN: 5.4 g/dL — AB (ref 6.0–8.3)

## 2013-05-23 LAB — GLUCOSE, CAPILLARY
GLUCOSE-CAPILLARY: 102 mg/dL — AB (ref 70–99)
Glucose-Capillary: 171 mg/dL — ABNORMAL HIGH (ref 70–99)

## 2013-05-23 LAB — MAGNESIUM: Magnesium: 1.2 mg/dL — ABNORMAL LOW (ref 1.5–2.5)

## 2013-05-23 NOTE — Progress Notes (Signed)
Patient discharged home, all discharge medications and instructions reviewed and questions answered. Patient to be assisted to vehicle by wheelchair.  

## 2013-05-23 NOTE — Progress Notes (Signed)
[name]   DOB:1955-09-23   IR#:[id number]   YTK#:[id number]  Subjective: Overall patient feels well. Her white count is now normal. She has remained afebrile. She is asking if she can be discharged to home. I think she is clinically stable then we certainly can have her go home and followup with Dr. Earlie Server in the [place] in the next week or 2.  Objective:  Filed Vitals:   05/23/13 0507  BP: 117/55  Pulse: 91  Temp: 98.6 F (37 C)  Resp: 16    Body mass index is 24.59 kg/(m^2).  Intake/Output Summary (Last 24 hours) at 05/23/13 1314 Last data filed at 05/23/13 0012  Gross per 24 hour  Intake      0 ml  Output   1100 ml  Net  -1100 ml     Sclerae unicteric  Oropharynx clear  No peripheral adenopathy  Lungs clear -- no rales or rhonchi  Heart regular rate and rhythm  Abdomen benign  MSK no focal spinal tenderness, no peripheral edema  Neuro nonfocal  Breast exam: deferred  CBG (last 3)   Recent Labs  05/22/13 2115 05/23/13 0746 05/23/13 1211  GLUCAP 106* 102* 171*     Labs:  Lab Results  Component Value Date   WBC 5.2 05/23/2013   HGB 8.3* 05/23/2013   HCT 22.6* 05/23/2013   MCV 90.4 05/23/2013   PLT 34* 05/23/2013   NEUTROABS 3.4 05/23/2013    Urine Studies No results found for this basename: UACOL, UAPR, USPG, UPH, UTP, UGL, UKET, UBIL, UHGB, UNIT, UROB, ULEU, UEPI, UWBC, URBC, UBAC, CAST, CRYS, UCOM, BILUA,  in the last 72 hours  Basic Metabolic Panel:  Recent Labs Lab 05/21/13 0035 05/21/13 0530 05/23/13 0420  NA 132* 133* 135*  K 4.4 4.1 3.4*  CL 93* 97 101  CO2 24 23 23   GLUCOSE 165* 137* 96  BUN 26* 23 9  CREATININE 1.31* 1.22* 1.08  CALCIUM 9.2 8.4 8.2*  MG  --   --  1.2*   GFR Estimated Creatinine Clearance: 53.2 ml/min (by C-G formula based on Cr of 1.08). Liver Function Tests:  Recent Labs Lab 05/21/13 0035 05/21/13 0530 05/23/13 0420  AST 11 10 14   ALT 11 10 13   ALKPHOS 69 60 51  BILITOT 0.5 0.4 0.2*  PROT 7.3 6.4 5.4*   ALBUMIN 3.2* 2.7* 2.4*   No results found for this basename: LIPASE, AMYLASE,  in the last 168 hours No results found for this basename: AMMONIA,  in the last 168 hours Coagulation profile No results found for this basename: INR, PROTIME,  in the last 168 hours  CBC:  Recent Labs Lab 05/21/13 0035 05/21/13 0530 05/23/13 0420  WBC 0.6* 0.7* 5.2  NEUTROABS 0.1* 0.1* 3.4  HGB 8.4* 8.8* 8.3*  HCT 24.2* 24.7* 22.6*  MCV 93.1 91.8 90.4  PLT 53* 43* 34*   Cardiac Enzymes:  Recent Labs Lab 05/21/13 0530  TROPONINI <0.30   BNP: No components found with this basename: POCBNP,  CBG:  Recent Labs Lab 05/22/13 1206 05/22/13 1645 05/22/13 2115 05/23/13 0746 05/23/13 1211  GLUCAP 180* 97 106* 102* 171*   D-Dimer No results found for this basename: DDIMER,  in the last 72 hours Hgb A1c No results found for this basename: HGBA1C,  in the last 72 hours Lipid Profile No results found for this basename: CHOL, HDL, LDLCALC, TRIG, CHOLHDL, LDLDIRECT,  in the last 72 hours Thyroid function studies No results found for this basename:  TSH, T4TOTAL, FREET3, T3FREE, THYROIDAB,  in the last 72 hours Anemia work up No results found for this basename: VITAMINB12, FOLATE, FERRITIN, TIBC, IRON, RETICCTPCT,  in the last 72 hours Microbiology Recent Results (from the past 240 hour(s))  CULTURE, BLOOD (ROUTINE X 2)     Status: None   Collection Time    05/21/13 12:35 AM      Result Value Range Status   Specimen Description BLOOD RIGHT ANTECUBITAL   Final   Special Requests BOTTLES DRAWN AEROBIC AND ANAEROBIC 5CC   Final   Culture  Setup Time     Final   Value: 05/21/2013 04:23     Performed at [facility]   Culture     Final   Value:        BLOOD CULTURE RECEIVED NO GROWTH TO DATE CULTURE WILL BE HELD FOR 5 DAYS BEFORE ISSUING A FINAL NEGATIVE REPORT     Performed at [facility]   Report Status PENDING   Incomplete      Studies:  No results  found.  Assessment/Plan: 58 y.o. with  #1 Limited stage small cell lung cancer: Patient has been receiving chemotherapy consisting of cis-platinum and etoposide. Thus far she has received 5 cycles. She's tolerated it well except for development of neutropenia.  #2 febrile neutropenia: Patient can be switched over to oral antibiotics. Her white count is normal.  #3caloric protein malnutrition: Patient is being encouraged to eat more now that she is feeling better.  #4 thrombocytopenia: Secondary to chemotherapy. If she is discharged to home today she needs to be careful and report any bleeding problems.  #4 disposition: Patient to be discharged to home with followup with Dr. Earlie Server. Patient was requested to call the office next week if she has any problems.   [name], Medrith Veillon 05/23/2013

## 2013-05-23 NOTE — Discharge Summary (Signed)
Physician Discharge Summary  [name] IFO:[id number] DOB: 12/02/1955 DOA: 05/20/2013  PCP: No PCP Per Patient  Admit date: 05/20/2013 Discharge date: 05/23/2013  Time spent: 40 minutes  Recommendations for Outpatient Follow-up:  Small cell lung cancer -Treatment per oncology  Febrile neutropenia  - Patient has been afebrile and current ANC= 3744  -DC cefepime and vancomycin  -Influenza panel negative -Per verbal order to RN Beverlee Nims by Dr.Kahn (oncologist), patient clear for discharge from oncology standpoint -Followup with Dr. Curt Bears (oncologist)  Nausea vomiting  - resolved,  -Continue carb modified diet   Acute renal failure  -Continue hydrate and monitor closely  Small cell lung cancer   CAD  -Stable troponin x1 negative   Diabetes mellitus type 2  - Discharge him home regimen    COPD  - Stable     Discharge Diagnoses:  Principal Problem:   Neutropenic fever Active Problems:   DM   DYSLIPIDEMIA   MITRAL REGURGITATION   HYPERTENSION   CAD   COPD clinical dx   Small cell lung carcinoma   Intractable vomiting   Febrile neutropenia   Discharge Condition: Stable  Diet recommendation: American diabetic Association   Filed Weights   05/20/13 2320 05/21/13 0401  Weight: 69.854 kg (154 lb) 69.083 kg (152 lb 4.8 oz)    History of present illness:  [name] is a 58 y.o. WF PMHx CAD, diabetes, HTN, HLD, COPD, limited stage small cell lung cancer on chemotherapy and radiation, last chemotherapy last week since then patient has been having nausea vomiting. Denies any abdominal pain diarrhea. Patient says that her nausea vomiting has persisted throughout the week and was unable to eat and drink anything. In addition patient had retrosternal burning sensation. Also has chills-like sensation but denies any drenching sweats or fever. In the ER patient was found to be pancytopenic with a fever of 101F. Initially patient also was mildly hypotensive  which improved with fluids. Patient has been admitted for febrile neutropenia. Patient denies any headache focal deficits blurred visions. Denies any shortness of breath or productive cough. 1/31 patient sitting in bed states feels well has been able to eat today all meals without any adverse affects 2/1 patient doing well today eating without N./V./D. Patient's neutrophilic count has recovered.  Consultants:  Dr. Curt Bears (oncology)    Procedures:  CXR 05/21/2013  Stable emphysema and chronic right middle lobe collapse.  Persistent 15 mm right upper lobe spiculated nodule  No superimposed new or acute process    Antibiotics:  Cefepime 1/30>>  stopped 2/1 Vancomycin 1/30>> stopped 2/1    Discharge Exam: Filed Vitals:   05/22/13 1400 05/22/13 1600 05/22/13 2057 05/23/13 0507  BP: 87/48 89/51 117/64 117/55  Pulse: 97  95 91  Temp: 98.1 F (36.7 C)  99 F (37.2 C) 98.6 F (37 C)  TempSrc: Oral  Oral Oral  Resp: 16  16 16   Height:      Weight:      SpO2: 96%  95% 97%   General: A./O. x4, NAD  Cardiovascular: Regular rate, negative murmurs rubs or gallops, DP/PT pulse +1  Respiratory: Clear to auscultation bilateral  Abdomen: Soft, nontender, nondistended, plus bowel sound  Musculoskeletal: Negative pedal edema      Discharge Instructions   Future Appointments Provider Department Dept Phone   05/31/2013 8:45 AM Curt Bears, [facility] [phone]   05/31/2013 9:30 AM [facility] [phone]   05/31/2013  10:30 AM [facility] [phone]   06/01/2013 10:30 AM [facility] [phone]   06/02/2013 12:15 PM [facility] [phone]   06/03/2013 9:15 AM [facility] [phone]       Medication  List    ASK your doctor about these medications       albuterol 108 (90 BASE) MCG/ACT inhaler  Commonly known as:  PROVENTIL HFA;VENTOLIN HFA  Inhale 2 puffs into the lungs every 6 (six) hours as needed for wheezing or shortness of breath.     aspirin 81 MG tablet  Take 81 mg by mouth at bedtime.     isosorbide mononitrate 30 MG 24 hr tablet  Commonly known as:  IMDUR  Take 30 mg by mouth daily.     loratadine 10 MG tablet  Commonly known as:  CLARITIN  Take 10 mg by mouth daily.     LORazepam 0.5 MG tablet  Commonly known as:  ATIVAN  1 tablet by mouth or sublingually every 8 hours as needed for nausea uncontrollerd by Phenergan or Zofran     lovastatin 20 MG tablet  Commonly known as:  MEVACOR  Take 20 mg by mouth at bedtime.     magic mouthwash Soln  Take 5 mLs by mouth 4 (four) times daily as needed for mouth pain.     magnesium oxide 400 (241.3 MG) MG tablet  Commonly known as:  MAG-OX  Take 1 tablet (400 mg total) by mouth 3 (three) times daily.     metFORMIN 850 MG tablet  Commonly known as:  GLUCOPHAGE  Take 850 mg by mouth 2 (two) times daily with a meal.     metoprolol succinate 50 MG 24 hr tablet  Commonly known as:  TOPROL-XL  Take 50 mg by mouth daily.     ondansetron 8 MG tablet  Commonly known as:  ZOFRAN  Take 1 tablet (8 mg total) by mouth every 8 (eight) hours as needed for nausea.     oxyCODONE-acetaminophen 5-325 MG per tablet  Commonly known as:  PERCOCET/ROXICET  Take 1 tablet by mouth every 4 (four) hours as needed for severe pain.     promethazine 25 MG tablet  Commonly known as:  PHENERGAN  TAKE ONE TABLET BY MOUTH EVERY 6 HOURS AS NEEDED     sucralfate 1 GM/10ML suspension  Commonly known as:  CARAFATE  Take 1 g by mouth 4 (four) times daily -  with meals and at bedtime.     SUPER B COMPLEX PO  Take 1 tablet by mouth daily.     valsartan 160 MG tablet  Commonly known as:  DIOVAN  Take 160 mg by mouth daily.       Allergies   Allergen Reactions  . Codeine Nausea And Vomiting  . Penicillins Itching      The results of significant diagnostics from this hospitalization (including imaging, microbiology, ancillary and laboratory) are listed below for reference.    Significant Diagnostic Studies: Dg Chest 2 View  05/21/2013   CLINICAL DATA:  Fever, small cell lung cancer, receiving chemotherapy  EXAM: CHEST  2 VIEW  COMPARISON:  05/07/2013 CT  FINDINGS: Stable spiculated nodule in the peripheral right upper lobe. Background emphysema noted. Chronic right middle lobe collapse along the right cardiac border. No edema or definite pneumonia. No effusion or pneumothorax. Trachea midline.  Normal heart size.  IMPRESSION: Stable emphysema and chronic right middle lobe collapse.  Persistent 15 mm right upper lobe spiculated nodule  No superimposed new or acute process   Electronically Signed   By: Daryll Brod M.D.   On: 05/21/2013 00:08   Ct Chest W Contrast  05/07/2013   CLINICAL DATA:  Restaging small cell lung cancer, right side.  EXAM: CT CHEST WITH CONTRAST  TECHNIQUE: Multidetector CT imaging of the chest was performed during intravenous contrast administration.  CONTRAST:  74mL OMNIPAQUE IOHEXOL 300 MG/ML  SOLN  COMPARISON:  CT SIM LUNG dated 04/21/2013; CT SIM LUNG dated 03/26/2013; NM PET IMAGE INITIAL (PI) SKULL BASE TO THIGH dated 02/05/2013; CT CHEST W/CM dated 03/19/2013; CT CHEST W/ CM dated 01/12/2013  FINDINGS: Right lower paratracheal node 1.5 cm in short axis on image 21 of series 2, formerly 1.9 cm. Calcifications along the inferior margin.  Calcified anterior mediastinal node 1.0 cm on image 21 of series 2, formerly 1.1 cm.  Indistinct subcarinal node short axis diameter 1.7 cm, formerly 1.8 cm. Mid esophageal wall thickening is suspected. Dense coronary artery atherosclerosis. Trace right pleural effusion, nonspecific for exudative versus transudative. Continued continued atelectasis in the right middle lobe.  Tapered narrowing of right middle lobe bronchi. Stable small hypodense liver lesions. Incidental jejunal diverticulum seen on image 61 of series 2.  Right upper lobe pulmonary nodule 1.3 x 1.6 cm on image 16 of series 5, formerly 1.3 by 1.6 cm. Scattered stable interstitial accentuation in the right lung with a somewhat reticulonodular configuration. Emphysema is present. Coronary artery atherosclerosis.  Since the previous diagnostic CT of 03/19/2013, there is patent re-expansion at resolution of the airspace opacity in the right upper lobe, although this re-expansion was also present on the 04/21/2013 radiation planning CT.  Stable mild prominence of right hilar nodal tissue with scattered calcifications.  IMPRESSION: 1. Mild improvement in mediastinal and right hilar adenopathy. The right upper lobe spiculated pulmonary nodule is stable in size compared to the radiation therapy planning CT of 04/21/2013, but was obscured by surrounding airspace opacity on the prior diagnostic CT scan of 03/19/2013. 2. Emphysema. 3. Mid esophageal wall thickening - esophagitis not excluded. 4. Coronary artery atherosclerosis. 5. Trace right pleural effusion. 6. Continued atelectasis in the right middle lobe. 7. Stable reticulonodular interstitial accentuation in the lungs. 8. Emphysema.   Electronically Signed   By: Sherryl Barters M.D.   On: 05/07/2013 11:22    Microbiology: Recent Results (from the past 240 hour(s))  CULTURE, BLOOD (ROUTINE X 2)     Status: None   Collection Time    05/21/13 12:35 AM      Result Value Range Status   Specimen Description BLOOD RIGHT ANTECUBITAL   Final   Special Requests BOTTLES DRAWN AEROBIC AND ANAEROBIC 5CC   Final   Culture  Setup Time     Final   Value: 05/21/2013 04:23     Performed at [facility]   Culture     Final   Value:        BLOOD CULTURE RECEIVED NO GROWTH TO DATE CULTURE WILL BE HELD FOR 5 DAYS BEFORE ISSUING A FINAL NEGATIVE REPORT     Performed at  [facility]   Report Status PENDING   Incomplete     Labs: Basic Metabolic Panel:  Recent Labs Lab 05/21/13 0035 05/21/13 0530 05/23/13 0420  NA 132* 133* 135*  K 4.4 4.1 3.4*  CL 93* 97 101  CO2 24 23 23   GLUCOSE 165* 137* 96  BUN 26* 23 9  CREATININE 1.31* 1.22* 1.08  CALCIUM 9.2 8.4 8.2*  MG  --   --  1.2*   Liver Function Tests:  Recent Labs Lab 05/21/13 0035 05/21/13 0530 05/23/13 0420  AST 11 10 14   ALT 11 10 13   ALKPHOS 69 60 51  BILITOT 0.5 0.4 0.2*  PROT 7.3 6.4 5.4*  ALBUMIN 3.2* 2.7* 2.4*   No results found for this basename: LIPASE, AMYLASE,  in the last 168 hours No results found for this basename: AMMONIA,  in the last 168 hours CBC:  Recent Labs Lab 05/21/13 0035 05/21/13 0530 05/23/13 0420  WBC 0.6* 0.7* 5.2  NEUTROABS 0.1* 0.1* 3.4  HGB 8.4* 8.8* 8.3*  HCT 24.2* 24.7* 22.6*  MCV 93.1 91.8 90.4  PLT 53* 43* 34*   Cardiac Enzymes:  Recent Labs Lab 05/21/13 0530  TROPONINI <0.30   BNP: BNP (last 3 results) No results found for this basename: PROBNP,  in the last 8760 hours CBG:  Recent Labs Lab 05/22/13 1206 05/22/13 1645 05/22/13 2115 05/23/13 0746 05/23/13 1211  GLUCAP 180* 97 106* 102* 171*       Signed:  Dia Crawford, MD [facility] [phone] pager

## 2013-05-25 LAB — TYPE AND SCREEN
ABO/RH(D): O POS
Antibody Screen: NEGATIVE
UNIT DIVISION: 0
Unit division: 0

## 2013-05-26 ENCOUNTER — Telehealth: Payer: Self-pay | Admitting: Medical Oncology

## 2013-05-26 NOTE — Telephone Encounter (Signed)
I told pt to keep appt Monday with Dr. Elvera Lennox call before for any problems

## 2013-05-26 NOTE — Telephone Encounter (Signed)
Does she need to see [name] this week or can she wait until scheduled appt Monday.

## 2013-05-27 LAB — CULTURE, BLOOD (ROUTINE X 2): Culture: NO GROWTH

## 2013-05-31 ENCOUNTER — Ambulatory Visit (HOSPITAL_BASED_OUTPATIENT_CLINIC_OR_DEPARTMENT_OTHER): Payer: Medicaid Other

## 2013-05-31 ENCOUNTER — Other Ambulatory Visit: Payer: Medicaid Other

## 2013-05-31 ENCOUNTER — Telehealth: Payer: Self-pay | Admitting: Internal Medicine

## 2013-05-31 ENCOUNTER — Ambulatory Visit: Payer: Medicaid Other | Admitting: Nutrition

## 2013-05-31 ENCOUNTER — Ambulatory Visit (HOSPITAL_BASED_OUTPATIENT_CLINIC_OR_DEPARTMENT_OTHER): Payer: Medicaid Other | Admitting: Internal Medicine

## 2013-05-31 ENCOUNTER — Other Ambulatory Visit: Payer: Self-pay | Admitting: *Deleted

## 2013-05-31 ENCOUNTER — Encounter: Payer: Self-pay | Admitting: Internal Medicine

## 2013-05-31 VITALS — BP 114/66 | HR 115 | Temp 98.1°F | Resp 18 | Ht 66.0 in | Wt 151.6 lb

## 2013-05-31 DIAGNOSIS — C3490 Malignant neoplasm of unspecified part of unspecified bronchus or lung: Secondary | ICD-10-CM

## 2013-05-31 DIAGNOSIS — C7A090 Malignant carcinoid tumor of the bronchus and lung: Secondary | ICD-10-CM

## 2013-05-31 DIAGNOSIS — R5383 Other fatigue: Secondary | ICD-10-CM

## 2013-05-31 DIAGNOSIS — C3491 Malignant neoplasm of unspecified part of right bronchus or lung: Secondary | ICD-10-CM

## 2013-05-31 DIAGNOSIS — R5381 Other malaise: Secondary | ICD-10-CM

## 2013-05-31 DIAGNOSIS — Z5111 Encounter for antineoplastic chemotherapy: Secondary | ICD-10-CM

## 2013-05-31 DIAGNOSIS — R110 Nausea: Secondary | ICD-10-CM

## 2013-05-31 DIAGNOSIS — D6481 Anemia due to antineoplastic chemotherapy: Secondary | ICD-10-CM

## 2013-05-31 DIAGNOSIS — T451X5A Adverse effect of antineoplastic and immunosuppressive drugs, initial encounter: Secondary | ICD-10-CM

## 2013-05-31 LAB — COMPREHENSIVE METABOLIC PANEL (CC13)
ALBUMIN: 3.1 g/dL — AB (ref 3.5–5.0)
ALT: 12 U/L (ref 0–55)
ANION GAP: 11 meq/L (ref 3–11)
AST: 16 U/L (ref 5–34)
Alkaline Phosphatase: 78 U/L (ref 40–150)
BUN: 16.1 mg/dL (ref 7.0–26.0)
CALCIUM: 9.1 mg/dL (ref 8.4–10.4)
CO2: 27 meq/L (ref 22–29)
Chloride: 101 mEq/L (ref 98–109)
Creatinine: 1.1 mg/dL (ref 0.6–1.1)
GLUCOSE: 236 mg/dL — AB (ref 70–140)
POTASSIUM: 3.7 meq/L (ref 3.5–5.1)
Sodium: 140 mEq/L (ref 136–145)
TOTAL PROTEIN: 6.2 g/dL — AB (ref 6.4–8.3)
Total Bilirubin: 0.27 mg/dL (ref 0.20–1.20)

## 2013-05-31 LAB — CBC WITH DIFFERENTIAL/PLATELET
BASO%: 0.4 % (ref 0.0–2.0)
BASOS ABS: 0.1 10*3/uL (ref 0.0–0.1)
EOS ABS: 0.1 10*3/uL (ref 0.0–0.5)
EOS%: 0.7 % (ref 0.0–7.0)
HEMATOCRIT: 26.6 % — AB (ref 34.8–46.6)
HEMOGLOBIN: 8.9 g/dL — AB (ref 11.6–15.9)
LYMPH%: 4.3 % — ABNORMAL LOW (ref 14.0–49.7)
MCH: 31.9 pg (ref 25.1–34.0)
MCHC: 33.5 g/dL (ref 31.5–36.0)
MCV: 95.3 fL (ref 79.5–101.0)
MONO#: 1.3 10*3/uL — ABNORMAL HIGH (ref 0.1–0.9)
MONO%: 8.7 % (ref 0.0–14.0)
NEUT%: 85.9 % — AB (ref 38.4–76.8)
NEUTROS ABS: 12.6 10*3/uL — AB (ref 1.5–6.5)
Platelets: 279 10*3/uL (ref 145–400)
RBC: 2.79 10*6/uL — ABNORMAL LOW (ref 3.70–5.45)
RDW: 15.5 % — AB (ref 11.2–14.5)
WBC: 14.7 10*3/uL — ABNORMAL HIGH (ref 3.9–10.3)
lymph#: 0.6 10*3/uL — ABNORMAL LOW (ref 0.9–3.3)
nRBC: 0 % (ref 0–0)

## 2013-05-31 LAB — MAGNESIUM (CC13): MAGNESIUM: 1.5 mg/dL (ref 1.5–2.5)

## 2013-05-31 MED ORDER — SODIUM CHLORIDE 0.9 % IV SOLN
150.0000 mg | Freq: Once | INTRAVENOUS | Status: AC
  Administered 2013-05-31: 150 mg via INTRAVENOUS
  Filled 2013-05-31: qty 5

## 2013-05-31 MED ORDER — PALONOSETRON HCL INJECTION 0.25 MG/5ML
INTRAVENOUS | Status: AC
  Filled 2013-05-31: qty 5

## 2013-05-31 MED ORDER — ONDANSETRON HCL 8 MG PO TABS: 8.0000 mg | ORAL_TABLET | Freq: Three times a day (TID) | ORAL | Status: DC | PRN

## 2013-05-31 MED ORDER — SODIUM CHLORIDE 0.9 % IV SOLN
Freq: Once | INTRAVENOUS | Status: AC
Start: 2013-05-31 — End: 2013-05-31
  Administered 2013-05-31: 10:00:00 via INTRAVENOUS

## 2013-05-31 MED ORDER — SODIUM CHLORIDE 0.9 % IV SOLN
60.0000 mg/m2 | Freq: Once | INTRAVENOUS | Status: AC
  Administered 2013-05-31: 110 mg via INTRAVENOUS
  Filled 2013-05-31: qty 110

## 2013-05-31 MED ORDER — DEXAMETHASONE SODIUM PHOSPHATE 20 MG/5ML IJ SOLN
INTRAMUSCULAR | Status: AC
  Filled 2013-05-31: qty 5

## 2013-05-31 MED ORDER — POTASSIUM CHLORIDE 2 MEQ/ML IV SOLN
Freq: Once | INTRAVENOUS | Status: AC
  Administered 2013-05-31: 10:00:00 via INTRAVENOUS
  Filled 2013-05-31: qty 10

## 2013-05-31 MED ORDER — PALONOSETRON HCL INJECTION 0.25 MG/5ML
0.2500 mg | Freq: Once | INTRAVENOUS | Status: AC
  Administered 2013-05-31: 0.25 mg via INTRAVENOUS

## 2013-05-31 MED ORDER — DEXAMETHASONE SODIUM PHOSPHATE 20 MG/5ML IJ SOLN
12.0000 mg | Freq: Once | INTRAMUSCULAR | Status: AC
  Administered 2013-05-31: 12 mg via INTRAVENOUS

## 2013-05-31 MED ORDER — SODIUM CHLORIDE 0.9 % IV SOLN
100.0000 mg/m2 | Freq: Once | INTRAVENOUS | Status: AC
  Administered 2013-05-31: 180 mg via INTRAVENOUS
  Filled 2013-05-31: qty 9

## 2013-05-31 NOTE — Telephone Encounter (Signed)
Gave pt appt for March 2015 lab ,md and Ct

## 2013-05-31 NOTE — Patient Instructions (Signed)
Continue chemotherapy as scheduled.  Followup visit in one month with repeat CT scan of the chest.

## 2013-05-31 NOTE — Progress Notes (Signed)
Discussed labs with Dr Julien Nordmann. OK to proceed.

## 2013-05-31 NOTE — Telephone Encounter (Signed)
Gave pt appt for lab and MD for march

## 2013-05-31 NOTE — Progress Notes (Signed)
Patient has not felt well recently.  She has been having daily nausea.  She complains of fatigue and weakness.  Weight decreased to 151.6 pounds February 9 from 165.9 pounds December 1.  Patient has received additional nausea medication.  She has not eaten yet today.  Reports throat pain improved.  Nutrition diagnosis: Food and nutrition related knowledge deficit continues.  Intervention: Patient educated to continue nausea medications as prescribed to prevent nausea.  I've educated patient on the importance of at least eating some dry, salty, bland foods every hour or 2 to help with nausea.  Recommended patient begin Carnation breakfast essentials for added calories and protein.  Monitoring, evaluation, goals: Patient has not been able to tolerate adequate calories or protein, and has had 9% weight loss since December 1.  Next visit: I will continue to work with patient as needed.

## 2013-05-31 NOTE — Patient Instructions (Addendum)
[facility] Discharge Instructions for Patients Receiving Chemotherapy  Today you received the following chemotherapy agents CISPLATIN, ETOPOSIDE  To help prevent nausea and vomiting after your treatment, we encourage you to take your nausea medication start taking about 7 pm as needed   If you develop nausea and vomiting that is not controlled by your nausea medication, call the clinic.   BELOW ARE SYMPTOMS THAT SHOULD BE REPORTED IMMEDIATELY:  *FEVER GREATER THAN 100.5 F  *CHILLS WITH OR WITHOUT FEVER  NAUSEA AND VOMITING THAT IS NOT CONTROLLED WITH YOUR NAUSEA MEDICATION  *UNUSUAL SHORTNESS OF BREATH  *UNUSUAL BRUISING OR BLEEDING  TENDERNESS IN MOUTH AND THROAT WITH OR WITHOUT PRESENCE OF ULCERS  *URINARY PROBLEMS  *BOWEL PROBLEMS  UNUSUAL RASH Items with * indicate a potential emergency and should be followed up as soon as possible.  Feel free to call the clinic you have any questions or concerns. The clinic phone number is [phone].

## 2013-05-31 NOTE — Progress Notes (Signed)
[facility]  [phone] [phone]   OFFICE PROGRESS NOTE  No PCP Per Patient [place] [id number]  DIAGNOSIS:    Limited stage small cell lung cancer diagnosed in October 2014.  PRIOR THERAPY: None  CURRENT THERAPY: Systemic chemotherapy with cisplatin at 60 mg per meter square given on day 1 and etoposide at 120 mg per meter squared given on days 1, 2 and 3 with Neulasta support given on day 4 every 3 weeks. A total 4-6 cycles are planned depending on response. Status post 5 cycles.  DISEASE STAGE: Limited stage  CHEMOTHERAPY INTENT: Control  CURRENT # OF CHEMOTHERAPY CYCLES: 6  CURRENT ANTIEMETICS: Compazine, Phenergan, dexamethasone, Zofran, Aloxi, Emend  CURRENT SMOKING STATUS: Former smoker, quit 01/30/2013  ORAL CHEMOTHERAPY AND CONSENT: n/a  CURRENT BISPHOSPHONATES USE: none  PAIN MANAGEMENT: none  NARCOTICS INDUCED CONSTIPATION: None  LIVING WILL AND CODE STATUS: Full Code   INTERVAL HISTORY: [name] 58 y.o. female returns for a scheduled follow up visit accompanied by her friend. The patient continues to complain of increasing fatigue and weakness as well as nausea almost daily especially in the morning and occasional nausea at night time. She is currently on Phenergan and Ativan with minimal improvement. She completed concurrent radiotherapy last week in [facility]. She denied having any significant fever or chills. She has no nausea or vomiting. The patient denied having any significant chest pain but continues to have shortness of breath with exertion was no cough or hemoptysis. She is here today to start cycle #6 of her chemotherapy.  MEDICAL HISTORY: Past Medical History  Diagnosis Date  . Tobacco abuse   . Diabetes mellitus   . Mitral regurgitation   . Hypertension   . Dyslipidemia   . Coronary artery disease   . History of echocardiogram 7/10    EF 50-55%; inferior hypokinesis; RV  function mild decreased  . ACE-inhibitor cough   . Multiple allergies     Penicillin and mushrooms  . Cancer     lung ca    ALLERGIES:  is allergic to codeine and penicillins.  MEDICATIONS:  Current Outpatient Prescriptions  Medication Sig Dispense Refill  . albuterol (PROVENTIL HFA;VENTOLIN HFA) 108 (90 BASE) MCG/ACT inhaler Inhale 2 puffs into the lungs every 6 (six) hours as needed for wheezing or shortness of breath.  1 Inhaler  2  . Alum & Mag Hydroxide-Simeth (MAGIC MOUTHWASH) SOLN Take 5 mLs by mouth 4 (four) times daily as needed for mouth pain.      Marland Kitchen aspirin 81 MG tablet Take 81 mg by mouth at bedtime.       . B Complex-C (SUPER B COMPLEX PO) Take 1 tablet by mouth daily.      . isosorbide mononitrate (IMDUR) 30 MG 24 hr tablet Take 30 mg by mouth daily.        Marland Kitchen loratadine (CLARITIN) 10 MG tablet Take 10 mg by mouth daily.      Marland Kitchen LORazepam (ATIVAN) 0.5 MG tablet 1 tablet by mouth or sublingually every 8 hours as needed for nausea uncontrollerd by Phenergan or Zofran  30 tablet  0  . lovastatin (MEVACOR) 20 MG tablet Take 20 mg by mouth at bedtime.      . magnesium oxide (MAG-OX) 400 (241.3 MG) MG tablet Take 1 tablet (400 mg total) by mouth 3 (three) times daily.  90 tablet  0  . metFORMIN (GLUCOPHAGE) 850 MG tablet Take 850 mg by mouth  2 (two) times daily with a meal.      . oxyCODONE-acetaminophen (PERCOCET/ROXICET) 5-325 MG per tablet Take 1 tablet by mouth every 4 (four) hours as needed for severe pain.      . promethazine (PHENERGAN) 25 MG tablet TAKE ONE TABLET BY MOUTH EVERY 6 HOURS AS NEEDED  30 tablet  0  . sucralfate (CARAFATE) 1 GM/10ML suspension Take 1 g by mouth 4 (four) times daily -  with meals and at bedtime.       No current facility-administered medications for this visit.    SURGICAL HISTORY:  Past Surgical History  Procedure Laterality Date  . Video bronchoscopy Bilateral 01/26/2013    Procedure: VIDEO BRONCHOSCOPY WITHOUT FLUORO;  Surgeon: Tanda Rockers, MD;  Location: [facility];  Service: Cardiopulmonary;  Laterality: Bilateral;    REVIEW OF SYSTEMS:  Constitutional: positive for fatigue Eyes: negative Ears, nose, mouth, throat, and face: negative Respiratory: positive for dyspnea on exertion Cardiovascular: negative Gastrointestinal: positive for nausea Genitourinary:negative Integument/breast: negative Hematologic/lymphatic: negative Musculoskeletal:negative Neurological: negative Behavioral/Psych: negative   PHYSICAL EXAMINATION: General appearance: alert, cooperative, appears stated age and no distress Head: Normocephalic, without obvious abnormality, atraumatic Neck: no adenopathy, no carotid bruit, no JVD, supple, symmetrical, trachea midline and thyroid not enlarged, symmetric, no tenderness/mass/nodules Lymph nodes: Cervical, supraclavicular, and axillary nodes normal. Resp: clear to auscultation bilaterally Cardio: regular rate and rhythm, S1, S2 normal, no murmur, click, rub or gallop GI: soft, non-tender; bowel sounds normal; no masses,  no organomegaly Extremities: extremities normal, atraumatic, no cyanosis or edema Neurologic: Alert and oriented X 3, normal strength and tone. Normal symmetric reflexes. Normal coordination and gait  ECOG PERFORMANCE STATUS: 1 - Symptomatic but completely ambulatory  Blood pressure 114/66, pulse 115, temperature 98.1 F (36.7 C), temperature source Oral, resp. rate 18, height 5\' 6"  (1.676 m), weight 151 lb 9.6 oz (68.765 kg), SpO2 97.00%.  LABORATORY DATA: Lab Results  Component Value Date   WBC 5.2 05/23/2013   HGB 8.3* 05/23/2013   HCT 22.6* 05/23/2013   MCV 90.4 05/23/2013   PLT 34* 05/23/2013      Chemistry      Component Value Date/Time   NA 135* 05/23/2013 0420   NA 138 05/10/2013 0919   K 3.4* 05/23/2013 0420   K 4.0 05/10/2013 0919   CL 101 05/23/2013 0420   CO2 23 05/23/2013 0420   CO2 21* 05/10/2013 0919   BUN 9 05/23/2013 0420   BUN 12.6 05/10/2013 0919   CREATININE 1.08  05/23/2013 0420   CREATININE 1.1 05/10/2013 0919      Component Value Date/Time   CALCIUM 8.2* 05/23/2013 0420   CALCIUM 9.3 05/10/2013 0919   ALKPHOS 51 05/23/2013 0420   ALKPHOS 76 05/10/2013 0919   AST 14 05/23/2013 0420   AST 11 05/10/2013 0919   ALT 13 05/23/2013 0420   ALT 9 05/10/2013 0919   BILITOT 0.2* 05/23/2013 0420   BILITOT 0.23 05/10/2013 0919       RADIOGRAPHIC STUDIES:  Ct Chest W Contrast  05/07/2013   CLINICAL DATA:  Restaging small cell lung cancer, right side.  EXAM: CT CHEST WITH CONTRAST  TECHNIQUE: Multidetector CT imaging of the chest was performed during intravenous contrast administration.  CONTRAST:  20mL OMNIPAQUE IOHEXOL 300 MG/ML  SOLN  COMPARISON:  CT SIM LUNG dated 04/21/2013; CT SIM LUNG dated 03/26/2013; NM PET IMAGE INITIAL (PI) SKULL BASE TO THIGH dated 02/05/2013; CT CHEST W/CM dated 03/19/2013; CT CHEST W/ CM dated 01/12/2013  FINDINGS: Right lower paratracheal node 1.5 cm in short axis on image 21 of series 2, formerly 1.9 cm. Calcifications along the inferior margin.  Calcified anterior mediastinal node 1.0 cm on image 21 of series 2, formerly 1.1 cm.  Indistinct subcarinal node short axis diameter 1.7 cm, formerly 1.8 cm. Mid esophageal wall thickening is suspected. Dense coronary artery atherosclerosis. Trace right pleural effusion, nonspecific for exudative versus transudative. Continued continued atelectasis in the right middle lobe. Tapered narrowing of right middle lobe bronchi. Stable small hypodense liver lesions. Incidental jejunal diverticulum seen on image 61 of series 2.  Right upper lobe pulmonary nodule 1.3 x 1.6 cm on image 16 of series 5, formerly 1.3 by 1.6 cm. Scattered stable interstitial accentuation in the right lung with a somewhat reticulonodular configuration. Emphysema is present. Coronary artery atherosclerosis.  Since the previous diagnostic CT of 03/19/2013, there is patent re-expansion at resolution of the airspace opacity in the right upper lobe,  although this re-expansion was also present on the 04/21/2013 radiation planning CT.  Stable mild prominence of right hilar nodal tissue with scattered calcifications.  IMPRESSION: 1. Mild improvement in mediastinal and right hilar adenopathy. The right upper lobe spiculated pulmonary nodule is stable in size compared to the radiation therapy planning CT of 04/21/2013, but was obscured by surrounding airspace opacity on the prior diagnostic CT scan of 03/19/2013. 2. Emphysema. 3. Mid esophageal wall thickening - esophagitis not excluded. 4. Coronary artery atherosclerosis. 5. Trace right pleural effusion. 6. Continued atelectasis in the right middle lobe. 7. Stable reticulonodular interstitial accentuation in the lungs. 8. Emphysema.   Electronically Signed   By: Sherryl Barters M.D.   On: 05/07/2013 11:22    ASSESSMENT/PLAN: This is a very pleasant 58 years old white female th limited stage small cell lung cancer currently undergoing systemic chemotherapy with cisplatin and etoposide status post 5 cycles concurrent with radiotherapy with continuous improvement in her disease. She completed the radiotherapy last week.  She is tolerating her treatment fairly well except for the nausea and fatigue from the chemotherapy-induced anemia. If her lab work is good today, the patient will proceed with cycle #6 as scheduled.  For the nausea I would add Zofran 8 mg by mouth 3 times a day to start 4-5 days after the chemotherapy.  I would see her back for followup visit in one month with repeat CT scan of the chest for restaging of her disease.  She was advised to call immediately if she has any concerning symptoms in the interval.  All questions were answered. The patient knows to call the clinic with any problems, questions or concerns. We can certainly see the patient much sooner if necessary.  I spent 15 minutes counseling the patient face to face. The total time spent in the appointment was 25  minutes.  Disclaimer: This note was dictated with voice recognition software. Similar sounding words can inadvertently be transcribed and may not be corrected upon review.   Eilleen Kempf., MD 05/31/2013

## 2013-06-01 ENCOUNTER — Ambulatory Visit (HOSPITAL_BASED_OUTPATIENT_CLINIC_OR_DEPARTMENT_OTHER): Payer: Medicaid Other

## 2013-06-01 ENCOUNTER — Other Ambulatory Visit: Payer: Self-pay | Admitting: *Deleted

## 2013-06-01 VITALS — BP 158/86 | HR 108 | Temp 97.6°F | Resp 18

## 2013-06-01 DIAGNOSIS — C3490 Malignant neoplasm of unspecified part of unspecified bronchus or lung: Secondary | ICD-10-CM

## 2013-06-01 DIAGNOSIS — Z5111 Encounter for antineoplastic chemotherapy: Secondary | ICD-10-CM

## 2013-06-01 MED ORDER — SODIUM CHLORIDE 0.9 % IV SOLN
Freq: Once | INTRAVENOUS | Status: AC
  Administered 2013-06-01: 11:00:00 via INTRAVENOUS

## 2013-06-01 MED ORDER — DEXAMETHASONE SODIUM PHOSPHATE 10 MG/ML IJ SOLN
INTRAMUSCULAR | Status: AC
  Filled 2013-06-01: qty 1

## 2013-06-01 MED ORDER — SODIUM CHLORIDE 0.9 % IV SOLN
100.0000 mg/m2 | Freq: Once | INTRAVENOUS | Status: AC
  Administered 2013-06-01: 180 mg via INTRAVENOUS
  Filled 2013-06-01: qty 9

## 2013-06-01 MED ORDER — DEXAMETHASONE SODIUM PHOSPHATE 10 MG/ML IJ SOLN
10.0000 mg | Freq: Once | INTRAMUSCULAR | Status: AC
  Administered 2013-06-01: 10 mg via INTRAVENOUS

## 2013-06-01 NOTE — Patient Instructions (Signed)
[facility] Discharge Instructions for Patients Receiving Chemotherapy  Today you received the following chemotherapy agents Etoposide.  To help prevent nausea and vomiting after your treatment, we encourage you to take your nausea medication as prescribed.   If you develop nausea and vomiting that is not controlled by your nausea medication, call the clinic.   BELOW ARE SYMPTOMS THAT SHOULD BE REPORTED IMMEDIATELY:  *FEVER GREATER THAN 100.5 F  *CHILLS WITH OR WITHOUT FEVER  NAUSEA AND VOMITING THAT IS NOT CONTROLLED WITH YOUR NAUSEA MEDICATION  *UNUSUAL SHORTNESS OF BREATH  *UNUSUAL BRUISING OR BLEEDING  TENDERNESS IN MOUTH AND THROAT WITH OR WITHOUT PRESENCE OF ULCERS  *URINARY PROBLEMS  *BOWEL PROBLEMS  UNUSUAL RASH Items with * indicate a potential emergency and should be followed up as soon as possible.  Feel free to call the clinic you have any questions or concerns. The clinic phone number is [phone].

## 2013-06-02 ENCOUNTER — Ambulatory Visit (HOSPITAL_BASED_OUTPATIENT_CLINIC_OR_DEPARTMENT_OTHER): Payer: Medicaid Other

## 2013-06-02 VITALS — BP 129/78 | HR 108 | Temp 97.9°F | Resp 16

## 2013-06-02 DIAGNOSIS — C3490 Malignant neoplasm of unspecified part of unspecified bronchus or lung: Secondary | ICD-10-CM

## 2013-06-02 DIAGNOSIS — C7A090 Malignant carcinoid tumor of the bronchus and lung: Secondary | ICD-10-CM

## 2013-06-02 DIAGNOSIS — Z5111 Encounter for antineoplastic chemotherapy: Secondary | ICD-10-CM

## 2013-06-02 MED ORDER — SODIUM CHLORIDE 0.9 % IV SOLN
100.0000 mg/m2 | Freq: Once | INTRAVENOUS | Status: AC
  Administered 2013-06-02: 180 mg via INTRAVENOUS
  Filled 2013-06-02: qty 9

## 2013-06-02 MED ORDER — DEXAMETHASONE SODIUM PHOSPHATE 10 MG/ML IJ SOLN
INTRAMUSCULAR | Status: AC
  Filled 2013-06-02: qty 1

## 2013-06-02 MED ORDER — SODIUM CHLORIDE 0.9 % IV SOLN
Freq: Once | INTRAVENOUS | Status: AC
  Administered 2013-06-02: 13:00:00 via INTRAVENOUS

## 2013-06-02 MED ORDER — DEXAMETHASONE SODIUM PHOSPHATE 10 MG/ML IJ SOLN
10.0000 mg | Freq: Once | INTRAMUSCULAR | Status: AC
  Administered 2013-06-02: 10 mg via INTRAVENOUS

## 2013-06-02 NOTE — Progress Notes (Signed)
Discharged at 1455, ambulatory in no distress with female friend.  Rang the bell for last treatment

## 2013-06-02 NOTE — Patient Instructions (Signed)
[facility] Discharge Instructions for Patients Receiving Chemotherapy  Today you received the following chemotherapy agents Etopiside/VP-16.  To help prevent nausea and vomiting after your treatment, we encourage you to take your nausea medication Phenergan 25 mg, ondansetron 8 mg and lorazepam 0.5 mg if needed as ordered.    If you develop nausea and vomiting that is not controlled by your nausea medication, call the clinic.   BELOW ARE SYMPTOMS THAT SHOULD BE REPORTED IMMEDIATELY:  *FEVER GREATER THAN 100.5 F  *CHILLS WITH OR WITHOUT FEVER  NAUSEA AND VOMITING THAT IS NOT CONTROLLED WITH YOUR NAUSEA MEDICATION  *UNUSUAL SHORTNESS OF BREATH  *UNUSUAL BRUISING OR BLEEDING  TENDERNESS IN MOUTH AND THROAT WITH OR WITHOUT PRESENCE OF ULCERS  *URINARY PROBLEMS  *BOWEL PROBLEMS  UNUSUAL RASH Items with * indicate a potential emergency and should be followed up as soon as possible.  Feel free to call the clinic you have any questions or concerns. The clinic phone number is [phone].

## 2013-06-02 NOTE — Progress Notes (Signed)
reports dizziness and a history of vertigo.  Was on antivert years ago, nothing to date.

## 2013-06-03 ENCOUNTER — Ambulatory Visit (HOSPITAL_BASED_OUTPATIENT_CLINIC_OR_DEPARTMENT_OTHER): Payer: Medicaid Other

## 2013-06-03 VITALS — BP 128/66 | HR 109 | Temp 98.4°F

## 2013-06-03 DIAGNOSIS — C7A1 Malignant poorly differentiated neuroendocrine tumors: Secondary | ICD-10-CM

## 2013-06-03 DIAGNOSIS — C3490 Malignant neoplasm of unspecified part of unspecified bronchus or lung: Secondary | ICD-10-CM

## 2013-06-03 DIAGNOSIS — Z5189 Encounter for other specified aftercare: Secondary | ICD-10-CM

## 2013-06-03 MED ORDER — PEGFILGRASTIM INJECTION 6 MG/0.6ML
6.0000 mg | Freq: Once | SUBCUTANEOUS | Status: AC
  Administered 2013-06-03: 6 mg via SUBCUTANEOUS
  Filled 2013-06-03: qty 0.6

## 2013-06-11 ENCOUNTER — Emergency Department (HOSPITAL_COMMUNITY)
Admission: EM | Admit: 2013-06-11 | Discharge: 2013-06-11 | Disposition: A | Discharge status: Home / Self Care | Payer: Medicaid Other | Attending: Emergency Medicine | Admitting: Emergency Medicine

## 2013-06-11 ENCOUNTER — Ambulatory Visit: Payer: Self-pay

## 2013-06-11 ENCOUNTER — Telehealth: Payer: Self-pay | Admitting: *Deleted

## 2013-06-11 ENCOUNTER — Encounter (HOSPITAL_COMMUNITY): Payer: Self-pay | Admitting: Emergency Medicine

## 2013-06-11 DIAGNOSIS — I2510 Atherosclerotic heart disease of native coronary artery without angina pectoris: Secondary | ICD-10-CM | POA: Insufficient documentation

## 2013-06-11 DIAGNOSIS — K219 Gastro-esophageal reflux disease without esophagitis: Secondary | ICD-10-CM | POA: Insufficient documentation

## 2013-06-11 DIAGNOSIS — I10 Essential (primary) hypertension: Secondary | ICD-10-CM | POA: Insufficient documentation

## 2013-06-11 DIAGNOSIS — R112 Nausea with vomiting, unspecified: Secondary | ICD-10-CM | POA: Insufficient documentation

## 2013-06-11 DIAGNOSIS — D61818 Other pancytopenia: Secondary | ICD-10-CM

## 2013-06-11 DIAGNOSIS — R000 Tachycardia, unspecified: Secondary | ICD-10-CM | POA: Insufficient documentation

## 2013-06-11 DIAGNOSIS — E785 Hyperlipidemia, unspecified: Secondary | ICD-10-CM | POA: Insufficient documentation

## 2013-06-11 DIAGNOSIS — Z79899 Other long term (current) drug therapy: Secondary | ICD-10-CM | POA: Insufficient documentation

## 2013-06-11 DIAGNOSIS — R0789 Other chest pain: Secondary | ICD-10-CM | POA: Insufficient documentation

## 2013-06-11 DIAGNOSIS — E860 Dehydration: Secondary | ICD-10-CM | POA: Insufficient documentation

## 2013-06-11 DIAGNOSIS — E119 Type 2 diabetes mellitus without complications: Secondary | ICD-10-CM | POA: Insufficient documentation

## 2013-06-11 DIAGNOSIS — Z880 Allergy status to penicillin: Secondary | ICD-10-CM | POA: Insufficient documentation

## 2013-06-11 DIAGNOSIS — Z7982 Long term (current) use of aspirin: Secondary | ICD-10-CM | POA: Insufficient documentation

## 2013-06-11 DIAGNOSIS — Z85118 Personal history of other malignant neoplasm of bronchus and lung: Secondary | ICD-10-CM | POA: Insufficient documentation

## 2013-06-11 DIAGNOSIS — Z87891 Personal history of nicotine dependence: Secondary | ICD-10-CM | POA: Insufficient documentation

## 2013-06-11 LAB — CBC WITH DIFFERENTIAL/PLATELET
BASOS PCT: 2 % — AB (ref 0–1)
Basophils Absolute: 0 10*3/uL (ref 0.0–0.1)
Eosinophils Absolute: 0 10*3/uL (ref 0.0–0.7)
Eosinophils Relative: 2 % (ref 0–5)
HCT: 22.4 % — ABNORMAL LOW (ref 36.0–46.0)
HEMOGLOBIN: 7.9 g/dL — AB (ref 12.0–15.0)
LYMPHS PCT: 34 % (ref 12–46)
Lymphs Abs: 0.4 10*3/uL — ABNORMAL LOW (ref 0.7–4.0)
MCH: 32.5 pg (ref 26.0–34.0)
MCHC: 35.3 g/dL (ref 30.0–36.0)
MCV: 92.2 fL (ref 78.0–100.0)
MONOS PCT: 34 % — AB (ref 3–12)
Monocytes Absolute: 0.4 10*3/uL (ref 0.1–1.0)
NEUTROS PCT: 28 % — AB (ref 43–77)
Neutro Abs: 0.5 10*3/uL — ABNORMAL LOW (ref 1.7–7.7)
PLATELETS: 40 10*3/uL — AB (ref 150–400)
RBC: 2.43 MIL/uL — AB (ref 3.87–5.11)
RDW: 14.4 % (ref 11.5–15.5)
WBC: 1.3 10*3/uL — AB (ref 4.0–10.5)

## 2013-06-11 LAB — URINALYSIS, ROUTINE W REFLEX MICROSCOPIC
Bilirubin Urine: NEGATIVE
Glucose, UA: 250 mg/dL — AB
Hgb urine dipstick: NEGATIVE
KETONES UR: NEGATIVE mg/dL
LEUKOCYTES UA: NEGATIVE
Nitrite: NEGATIVE
PROTEIN: 30 mg/dL — AB
Specific Gravity, Urine: 1.014 (ref 1.005–1.030)
Urobilinogen, UA: 1 mg/dL (ref 0.0–1.0)
pH: 7 (ref 5.0–8.0)

## 2013-06-11 LAB — URINE MICROSCOPIC-ADD ON

## 2013-06-11 LAB — I-STAT TROPONIN, ED: TROPONIN I, POC: 0.02 ng/mL (ref 0.00–0.08)

## 2013-06-11 LAB — COMPREHENSIVE METABOLIC PANEL
ALBUMIN: 3.6 g/dL (ref 3.5–5.2)
ALK PHOS: 85 U/L (ref 39–117)
ALT: 14 U/L (ref 0–35)
AST: 15 U/L (ref 0–37)
BILIRUBIN TOTAL: 0.4 mg/dL (ref 0.3–1.2)
BUN: 26 mg/dL — ABNORMAL HIGH (ref 6–23)
CO2: 28 mEq/L (ref 19–32)
Calcium: 9.5 mg/dL (ref 8.4–10.5)
Chloride: 89 mEq/L — ABNORMAL LOW (ref 96–112)
Creatinine, Ser: 1.6 mg/dL — ABNORMAL HIGH (ref 0.50–1.10)
GFR calc Af Amer: 40 mL/min — ABNORMAL LOW (ref >90)
GFR calc non Af Amer: 34 mL/min — ABNORMAL LOW (ref >90)
Glucose, Bld: 187 mg/dL — ABNORMAL HIGH (ref 70–99)
POTASSIUM: 3.5 meq/L — AB (ref 3.7–5.3)
Sodium: 133 mEq/L — ABNORMAL LOW (ref 137–147)
TOTAL PROTEIN: 7.2 g/dL (ref 6.0–8.3)

## 2013-06-11 MED ORDER — LORAZEPAM 1 MG PO TABS: 1.0000 mg | ORAL_TABLET | ORAL | Status: DC | PRN

## 2013-06-11 MED ORDER — PROMETHAZINE HCL 12.5 MG PO TABS: 12.5000 mg | ORAL_TABLET | Freq: Four times a day (QID) | ORAL | Status: DC | PRN

## 2013-06-11 MED ORDER — ONDANSETRON HCL 4 MG/2ML IJ SOLN
4.0000 mg | Freq: Once | INTRAMUSCULAR | Status: AC
  Administered 2013-06-11: 4 mg via INTRAVENOUS
  Filled 2013-06-11: qty 2

## 2013-06-11 MED ORDER — SODIUM CHLORIDE 0.9 % IV BOLUS (SEPSIS)
1000.0000 mL | Freq: Once | INTRAVENOUS | Status: AC
  Administered 2013-06-11: 1000 mL via INTRAVENOUS

## 2013-06-11 NOTE — ED Provider Notes (Signed)
Complains of vomiting multiple times over the past 8 or 9 days since receiving chemotherapy which is typical after treatment with chemotherapy. She also reports anterior chest pain for the past 6 weeks, almost continuously, worse with exposure to cold or worse with vomiting. Pain is nonexertional. He presents today she is feeling dehydrated. She's been treated herself with Ativan for nausea with some relief however as she has only a few tablets left.  Orlie Dakin, MD 06/11/13 [phone]

## 2013-06-11 NOTE — Telephone Encounter (Signed)
Pt's husband called stating that pt has been vomiting daily for the past week.  She is very dehydrated and is not eating.  She is having trouble walking by herself and last night her husband had to help sit her up.  He states he is not sure what nausea medication she has at home besides the zofran but it has been ineffective.  He said there was a rx that they never picked up for nausea that was $200 but they never communicated with [place] that it was so expensive.  This happened the last time she got chemotherapy.  Spoke with Delos Haring, consulted further with Mr. Crear.  Everyone is in agreement that pt should go to the ED to be further evaluated. SLJ

## 2013-06-11 NOTE — Discharge Instructions (Signed)
Continue ativan for nausea. You can also try phenergan for nausea and vomiting. Your lab work is showing anemia, low white blood count, low platelets. Please follow up for recheck and further management with your doctor. Return if symptoms worsening. Drink plenty of fluids. Rest.    Dehydration, Adult Dehydration means your body does not have as much fluid as it needs. Your kidneys, brain, and heart will not work properly without the right amount of fluids and salt.  HOME CARE  Ask your doctor how to replace body fluid losses (rehydrate).  Drink enough fluids to keep your pee (urine) clear or pale yellow.  Drink small amounts of fluids often if you feel sick to your stomach (nauseous) or throw up (vomit).  Eat like you normally do.  Avoid:  Foods or drinks high in sugar.  Bubbly (carbonated) drinks.  Juice.  Very hot or cold fluids.  Drinks with caffeine.  Fatty, greasy foods.  Alcohol.  Tobacco.  Eating too much.  Gelatin desserts.  Wash your hands to avoid spreading germs (bacteria, viruses).  Only take medicine as told by your doctor.  Keep all doctor visits as told. GET HELP RIGHT AWAY IF:   You cannot drink something without throwing up.  You get worse even with treatment.  Your vomit has blood in it or looks greenish.  Your poop (stool) has blood in it or looks black and tarry.  You have not peed in 6 to 8 hours.  You pee a small amount of very dark pee.  You have a fever.  You pass out (faint).  You have belly (abdominal) pain that gets worse or stays in one spot (localizes).  You have a rash, stiff neck, or bad headache.  You get easily annoyed, sleepy, or are hard to wake up.  You feel weak, dizzy, or very thirsty. MAKE SURE YOU:   Understand these instructions.  Will watch your condition.  Will get help right away if you are not doing well or get worse. Document Released: 02/02/2009 Document Revised: 07/01/2011 Document Reviewed:  11/26/2010 [facility] Patient Information 2014 [place], [place].

## 2013-06-11 NOTE — ED Notes (Signed)
Patient is aware that we need a urine specimen.  

## 2013-06-11 NOTE — Telephone Encounter (Signed)
Pt's husband called stating that they would like assistance regarding the zofran rx that was too expensive.  Gave Raquel's # to discuss any options regarding the high co-pay.  SLJ

## 2013-06-11 NOTE — ED Provider Notes (Signed)
CSN: [id number]     Arrival date & time 06/11/13  1520 History   First MD Initiated Contact with Patient 06/11/13 1524     Chief Complaint  Patient presents with  . Emesis     (Consider location/radiation/quality/duration/timing/severity/associated sxs/prior Treatment) HPI [name] is a 58 y.o. 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 Patient states that the reason for her visit is persistent nausea and vomiting and generalized weakness. She states that she last received her chemotherapy on Wednesday and "a shot" on Thursdays weekly. She states her last medication doses were last week, and states that is hopefully her last treatment. She states on Thursday which was 8 days ago, and she became nauseated and has been vomiting daily since. She states she's unable to keep any food or her regular medications down. She states she was given a prescription for an enzymatic, name of which she cannot remember, but states that there is a problem with her current insurance and they wouldn't cover it and it was $165 for it her to get it filled. Patient states she did not have money to fill it. She states she currently just takes Ativan "once in a while" for her nausea which helps, but states that she is low on it so she tries to save it and does not take it regularly. She states she took a dose just prior to coming here. Patient states that she does not think his Phenergan or Compazine at home, which looks like was prescribed to her in the past for her nausea. Patient denies any fever or chills. She denies any pain. She does admit to intermittent chest pains which she has had since her radiation, however last night she said it was worse and she took a nitroglycerin which helped. Patient states that she thinks the pain in her chest is still coming from acid reflux and from vomiting. She states it feels like burning up and down her chest and  to the throat. She denies any shortness of breath. She denies any diaphoresis.  Past Medical History  Diagnosis Date  . Tobacco abuse   . Diabetes mellitus   . Mitral regurgitation   . Hypertension   . Dyslipidemia   . Coronary artery disease   . History of echocardiogram 7/10    EF 50-55%; inferior hypokinesis; RV function mild decreased  . ACE-inhibitor cough   . Multiple allergies     Penicillin and mushrooms  . Cancer     lung ca   Past Surgical History  Procedure Laterality Date  . Video bronchoscopy Bilateral 01/26/2013    Procedure: VIDEO BRONCHOSCOPY WITHOUT FLUORO;  Surgeon: Tanda Rockers, MD;  Location: [facility];  Service: Cardiopulmonary;  Laterality: Bilateral;   Family History  Problem Relation Age of Onset  . Stroke Mother   . Coronary artery disease Father    History  Substance Use Topics  . Smoking status: Former Smoker -- 1.50 packs/day for 40 years    Types: Cigarettes    Quit date: 01/30/2013  . Smokeless tobacco: Never Used  . Alcohol Use: No   OB History   Grav Para Term Preterm Abortions TAB SAB Ect Mult Living                 Review of Systems  Constitutional: Negative for fever and chills.  Respiratory: Positive for chest tightness. Negative for cough and shortness of breath.   Cardiovascular: Positive for chest  pain. Negative for palpitations and leg swelling.  Gastrointestinal: Positive for nausea and vomiting. Negative for abdominal pain and diarrhea.  Genitourinary: Negative for dysuria, flank pain, vaginal bleeding, vaginal discharge, vaginal pain and pelvic pain.  Musculoskeletal: Negative for arthralgias, myalgias, neck pain and neck stiffness.  Skin: Negative for rash.  Neurological: Negative for dizziness, weakness and headaches.  All other systems reviewed and are negative.      Allergies  Codeine and Penicillins  Home Medications   Current Outpatient Rx  Name  Route  Sig  Dispense  Refill  . albuterol (PROVENTIL  HFA;VENTOLIN HFA) 108 (90 BASE) MCG/ACT inhaler   Inhalation   Inhale 2 puffs into the lungs every 6 (six) hours as needed for wheezing or shortness of breath.   1 Inhaler   2   . Alum & Mag Hydroxide-Simeth (MAGIC MOUTHWASH) SOLN   Oral   Take 5 mLs by mouth 4 (four) times daily as needed for mouth pain.         Marland Kitchen aspirin 81 MG tablet   Oral   Take 81 mg by mouth at bedtime.          . B Complex-C (SUPER B COMPLEX PO)   Oral   Take 1 tablet by mouth daily.         . isosorbide mononitrate (IMDUR) 30 MG 24 hr tablet   Oral   Take 30 mg by mouth daily.           Marland Kitchen loratadine (CLARITIN) 10 MG tablet   Oral   Take 10 mg by mouth daily.         Marland Kitchen LORazepam (ATIVAN) 0.5 MG tablet      1 tablet by mouth or sublingually every 8 hours as needed for nausea uncontrollerd by Phenergan or Zofran   30 tablet   0     CALLED TO PHARMACIST.   Marland Kitchen lovastatin (MEVACOR) 20 MG tablet   Oral   Take 20 mg by mouth at bedtime.         . magnesium oxide (MAG-OX) 400 (241.3 MG) MG tablet   Oral   Take 1 tablet (400 mg total) by mouth 3 (three) times daily.   90 tablet   0   . metFORMIN (GLUCOPHAGE) 850 MG tablet   Oral   Take 850 mg by mouth 2 (two) times daily with a meal.         . ondansetron (ZOFRAN) 8 MG tablet   Oral   Take 1 tablet (8 mg total) by mouth every 8 (eight) hours as needed for nausea or vomiting (start 4-5 days after chemo if needed).   30 tablet   0   . oxyCODONE-acetaminophen (PERCOCET/ROXICET) 5-325 MG per tablet   Oral   Take 1 tablet by mouth every 4 (four) hours as needed for severe pain.         . promethazine (PHENERGAN) 25 MG tablet      TAKE ONE TABLET BY MOUTH EVERY 6 HOURS AS NEEDED   30 tablet   0     CALLED TO PHARMACIST.   Marland Kitchen sucralfate (CARAFATE) 1 GM/10ML suspension   Oral   Take 1 g by mouth 4 (four) times daily -  with meals and at bedtime.          BP 110/70  Pulse 108  Temp(Src) 98.5 F (36.9 C) (Oral)  Resp 14   SpO2 98% Physical Exam  Nursing note and vitals reviewed. Constitutional: She  appears well-developed and well-nourished. No distress.  HENT:  Head: Normocephalic.  Eyes: Conjunctivae are normal.  Neck: Neck supple.  Cardiovascular: Regular rhythm and normal heart sounds.  Tachycardia present.   Pulmonary/Chest: Effort normal and breath sounds normal. No respiratory distress. She has no wheezes. She has no rales.  Abdominal: Soft. Bowel sounds are normal. She exhibits no distension. There is no tenderness. There is no rebound.  Musculoskeletal: She exhibits no edema.  Neurological: She is alert.  Skin: Skin is warm and dry.  Psychiatric: She has a normal mood and affect. Her behavior is normal.    ED Course  Procedures (including critical care time) Labs Review Labs Reviewed  CBC WITH DIFFERENTIAL - Abnormal; Notable for the following:    WBC 1.3 (*)    RBC 2.43 (*)    Hemoglobin 7.9 (*)    HCT 22.4 (*)    Platelets 40 (*)    Neutrophils Relative % 28 (*)    Monocytes Relative 34 (*)    Basophils Relative 2 (*)    Neutro Abs 0.5 (*)    Lymphs Abs 0.4 (*)    All other components within normal limits  COMPREHENSIVE METABOLIC PANEL - Abnormal; Notable for the following:    Sodium 133 (*)    Potassium 3.5 (*)    Chloride 89 (*)    Glucose, Bld 187 (*)    BUN 26 (*)    Creatinine, Ser 1.60 (*)    GFR calc non Af Amer 34 (*)    GFR calc Af Amer 40 (*)    All other components within normal limits  URINALYSIS, ROUTINE W REFLEX MICROSCOPIC - Abnormal; Notable for the following:    Glucose, UA 250 (*)    Protein, ur 30 (*)    All other components within normal limits  URINE MICROSCOPIC-ADD ON - Abnormal; Notable for the following:    Squamous Epithelial / LPF FEW (*)    All other components within normal limits  URINE CULTURE  I-STAT TROPOININ, ED   Imaging Review No results found.  EKG Interpretation    Date/Time:  Friday June 11 2013 15:58:40 EST Ventricular  Rate:  103 PR Interval:  131 QRS Duration: 83 QT Interval:  354 QTC Calculation: 463 R Axis:   20 Text Interpretation:  Sinus tachycardia Inferolateral infarct, old Baseline wander in lead(s) I II aVR V6 Since last tracing rate faster Confirmed by Winfred Leeds  MD, SAM ([id number]) on 06/11/2013 4:24:28 PM            MDM   Final diagnoses:  Nausea & vomiting  Pancytopenia  Dehydration    Patient in emergency department with uncontrolled nausea and vomiting. Patient's chronic chemotherapy last treatment 8 days ago. Patient reports similar presents after chemotherapy in the past. Currently not taking anti-emetics because unable to afford so forscribe. She did take Ativan 3 times which has helped but she is running out and so she has been taking her pills. She denies any abdominal pain. She denies any fever. No changes in bowels. She feels well otherwise. Will get lab work, fluids started, we'll try some Zofran IV, and patient did take Ativan prior to coming in if she's feeling better already here. Her abdomen is benign.  6:18 PM Patient's lab work is back and is at baseline. She is anemic but appears to be at her baseline. Her white count is low, but unchanged from prior. Her platelets are low as well and are unchanged as well, probably all  related to her recent chemotherapy. Her creatinine is elevated at 1.6, this is slightly higher than her baseline. I do suspect this is due to dehydration. She was rehydrated with IV saline in emergency department. She received 4 mg of Zofran IV. She is now feeling much better. She is drinking fluids at bedside. She is asking if she can eat. I have discussed starting out slowly, very mild diet for today and tomorrow. I will be discharging her home with Ativan and Phenergan. She is to call her Dr. on Monday. Patient and her daughter voiced understanding and are comfortable with the plan.  Filed Vitals:   06/11/13 1524 06/11/13 1655  BP: 110/70   Pulse: 108 99   Temp: 98.5 F (36.9 C)   TempSrc: Oral   Resp: 14 18  SpO2: 98% 100%       Renold Genta, PA-C 06/12/13 0033

## 2013-06-11 NOTE — ED Notes (Signed)
Pt alert and oriented x4. Respirations even and unlabored, bilateral symmetrical rise and fall of chest. Skin warm and dry. In no acute distress. Denies needs.   

## 2013-06-11 NOTE — ED Notes (Signed)
Pt reports having lung cancer - small cell carcinoma. Pt reports having chemotherapy last Wednesday. Pt reports nausea and emesis that began on Thursday. Pt reports being unable to keep down fluids or food.

## 2013-06-11 NOTE — ED Notes (Signed)
Pt escorted to discharge window. Pt verbalized understanding discharge instructions. In no acute distress.  

## 2013-06-12 NOTE — ED Provider Notes (Signed)
Medical screening examination/treatment/procedure(s) were conducted as a shared visit with non-physician practitioner(s) and myself.  I personally evaluated the patient during the encounter.  EKG Interpretation    Date/Time:  Friday June 11 2013 15:58:40 EST Ventricular Rate:  103 PR Interval:  131 QRS Duration: 83 QT Interval:  354 QTC Calculation: 463 R Axis:   20 Text Interpretation:  Sinus tachycardia Inferolateral infarct, old Baseline wander in lead(s) I II aVR V6 Since last tracing rate faster Confirmed by Winfred Leeds  MD, Willodene Stallings ([id number]) on 06/11/2013 4:24:28 PM             Orlie Dakin, MD 06/12/13 1431

## 2013-06-13 LAB — URINE CULTURE

## 2013-06-14 ENCOUNTER — Encounter: Payer: Self-pay | Admitting: Internal Medicine

## 2013-06-14 NOTE — Progress Notes (Signed)
Stephanie left message that hubby had called- Marcello Moores. I called and spoke with patient and she said all was taken of. I inquired about the Zofran and she said it was filled.

## 2013-06-28 ENCOUNTER — Other Ambulatory Visit: Payer: Self-pay

## 2013-06-28 ENCOUNTER — Other Ambulatory Visit (HOSPITAL_BASED_OUTPATIENT_CLINIC_OR_DEPARTMENT_OTHER): Payer: Self-pay

## 2013-06-28 ENCOUNTER — Encounter (HOSPITAL_COMMUNITY): Payer: Self-pay

## 2013-06-28 ENCOUNTER — Ambulatory Visit (HOSPITAL_COMMUNITY)
Admission: RE | Admit: 2013-06-28 | Discharge: 2013-06-28 | Disposition: A | Discharge status: Home / Self Care | Payer: Medicaid Other | Source: Ambulatory Visit | Attending: Internal Medicine | Admitting: Internal Medicine

## 2013-06-28 ENCOUNTER — Ambulatory Visit: Payer: Self-pay | Admitting: Internal Medicine

## 2013-06-28 DIAGNOSIS — C3490 Malignant neoplasm of unspecified part of unspecified bronchus or lung: Secondary | ICD-10-CM | POA: Insufficient documentation

## 2013-06-28 DIAGNOSIS — D6481 Anemia due to antineoplastic chemotherapy: Secondary | ICD-10-CM

## 2013-06-28 DIAGNOSIS — R599 Enlarged lymph nodes, unspecified: Secondary | ICD-10-CM | POA: Insufficient documentation

## 2013-06-28 DIAGNOSIS — T451X5A Adverse effect of antineoplastic and immunosuppressive drugs, initial encounter: Secondary | ICD-10-CM

## 2013-06-28 DIAGNOSIS — C7A090 Malignant carcinoid tumor of the bronchus and lung: Secondary | ICD-10-CM

## 2013-06-28 LAB — CBC WITH DIFFERENTIAL/PLATELET
BASO%: 1.3 % (ref 0.0–2.0)
BASOS ABS: 0.1 10*3/uL (ref 0.0–0.1)
EOS ABS: 0.1 10*3/uL (ref 0.0–0.5)
EOS%: 2.4 % (ref 0.0–7.0)
HEMATOCRIT: 22.5 % — AB (ref 34.8–46.6)
HGB: 7.5 g/dL — ABNORMAL LOW (ref 11.6–15.9)
LYMPH#: 0.5 10*3/uL — AB (ref 0.9–3.3)
LYMPH%: 10.1 % — AB (ref 14.0–49.7)
MCH: 33.7 pg (ref 25.1–34.0)
MCHC: 33.5 g/dL (ref 31.5–36.0)
MCV: 100.8 fL (ref 79.5–101.0)
MONO#: 0.6 10*3/uL (ref 0.1–0.9)
MONO%: 11.5 % (ref 0.0–14.0)
NEUT%: 74.7 % (ref 38.4–76.8)
NEUTROS ABS: 3.8 10*3/uL (ref 1.5–6.5)
PLATELETS: 203 10*3/uL (ref 145–400)
RBC: 2.23 10*6/uL — ABNORMAL LOW (ref 3.70–5.45)
RDW: 18.3 % — ABNORMAL HIGH (ref 11.2–14.5)
WBC: 5.1 10*3/uL (ref 3.9–10.3)

## 2013-06-28 LAB — COMPREHENSIVE METABOLIC PANEL (CC13)
ALT: 10 U/L (ref 0–55)
AST: 17 U/L (ref 5–34)
Albumin: 3.3 g/dL — ABNORMAL LOW (ref 3.5–5.0)
Alkaline Phosphatase: 56 U/L (ref 40–150)
Anion Gap: 9 mEq/L (ref 3–11)
BUN: 15.2 mg/dL (ref 7.0–26.0)
CALCIUM: 9.3 mg/dL (ref 8.4–10.4)
CHLORIDE: 103 meq/L (ref 98–109)
CO2: 28 meq/L (ref 22–29)
Creatinine: 1.3 mg/dL — ABNORMAL HIGH (ref 0.6–1.1)
GLUCOSE: 153 mg/dL — AB (ref 70–140)
Potassium: 4.3 mEq/L (ref 3.5–5.1)
Sodium: 140 mEq/L (ref 136–145)
Total Bilirubin: 0.37 mg/dL (ref 0.20–1.20)
Total Protein: 6.3 g/dL — ABNORMAL LOW (ref 6.4–8.3)

## 2013-06-28 MED ORDER — IOHEXOL 300 MG/ML  SOLN
80.0000 mL | Freq: Once | INTRAMUSCULAR | Status: AC | PRN
  Administered 2013-06-28: 80 mL via INTRAVENOUS

## 2013-06-29 ENCOUNTER — Telehealth: Payer: Self-pay | Admitting: Internal Medicine

## 2013-06-29 ENCOUNTER — Ambulatory Visit (HOSPITAL_COMMUNITY)
Admission: RE | Admit: 2013-06-29 | Discharge: 2013-06-29 | Disposition: A | Discharge status: Home / Self Care | Payer: Medicaid Other | Source: Ambulatory Visit | Attending: Internal Medicine | Admitting: Internal Medicine

## 2013-06-29 ENCOUNTER — Ambulatory Visit (HOSPITAL_BASED_OUTPATIENT_CLINIC_OR_DEPARTMENT_OTHER): Payer: Self-pay | Admitting: Internal Medicine

## 2013-06-29 ENCOUNTER — Other Ambulatory Visit: Payer: Self-pay | Admitting: *Deleted

## 2013-06-29 ENCOUNTER — Encounter: Payer: Self-pay | Admitting: Internal Medicine

## 2013-06-29 ENCOUNTER — Ambulatory Visit: Payer: Medicaid Other

## 2013-06-29 ENCOUNTER — Ambulatory Visit (HOSPITAL_BASED_OUTPATIENT_CLINIC_OR_DEPARTMENT_OTHER): Payer: Self-pay

## 2013-06-29 VITALS — BP 127/60 | HR 86 | Temp 97.2°F | Resp 18

## 2013-06-29 VITALS — BP 126/67 | HR 103 | Temp 98.2°F | Resp 19 | Ht 66.0 in | Wt 150.0 lb

## 2013-06-29 DIAGNOSIS — D6481 Anemia due to antineoplastic chemotherapy: Secondary | ICD-10-CM

## 2013-06-29 DIAGNOSIS — C3490 Malignant neoplasm of unspecified part of unspecified bronchus or lung: Secondary | ICD-10-CM

## 2013-06-29 DIAGNOSIS — D649 Anemia, unspecified: Secondary | ICD-10-CM

## 2013-06-29 DIAGNOSIS — C7A090 Malignant carcinoid tumor of the bronchus and lung: Secondary | ICD-10-CM

## 2013-06-29 DIAGNOSIS — T451X5A Adverse effect of antineoplastic and immunosuppressive drugs, initial encounter: Secondary | ICD-10-CM

## 2013-06-29 LAB — PREPARE RBC (CROSSMATCH)

## 2013-06-29 MED ORDER — SODIUM CHLORIDE 0.9 % IJ SOLN
10.0000 mL | INTRAMUSCULAR | Status: DC | PRN
  Filled 2013-06-29: qty 10

## 2013-06-29 MED ORDER — DIPHENHYDRAMINE HCL 25 MG PO CAPS
ORAL_CAPSULE | ORAL | Status: AC
  Filled 2013-06-29: qty 1

## 2013-06-29 MED ORDER — LORAZEPAM 1 MG PO TABS
ORAL_TABLET | ORAL | Status: AC
  Filled 2013-06-29: qty 1

## 2013-06-29 MED ORDER — HEPARIN SOD (PORK) LOCK FLUSH 100 UNIT/ML IV SOLN
500.0000 [IU] | Freq: Every day | INTRAVENOUS | Status: DC | PRN
  Filled 2013-06-29: qty 5

## 2013-06-29 MED ORDER — ACETAMINOPHEN 325 MG PO TABS
650.0000 mg | ORAL_TABLET | Freq: Once | ORAL | Status: AC
  Administered 2013-06-29: 650 mg via ORAL

## 2013-06-29 MED ORDER — LORAZEPAM 1 MG PO TABS
0.5000 mg | ORAL_TABLET | Freq: Once | ORAL | Status: AC
  Administered 2013-06-29: 0.5 mg via ORAL

## 2013-06-29 MED ORDER — SODIUM CHLORIDE 0.9 % IV SOLN
250.0000 mL | Freq: Once | INTRAVENOUS | Status: AC
  Administered 2013-06-29: 250 mL via INTRAVENOUS

## 2013-06-29 MED ORDER — ACETAMINOPHEN 325 MG PO TABS
ORAL_TABLET | ORAL | Status: AC
  Filled 2013-06-29: qty 2

## 2013-06-29 MED ORDER — DIPHENHYDRAMINE HCL 25 MG PO CAPS
25.0000 mg | ORAL_CAPSULE | Freq: Once | ORAL | Status: AC
  Administered 2013-06-29: 25 mg via ORAL

## 2013-06-29 NOTE — Telephone Encounter (Signed)
Gave pt appt for lab,md and chemo for June 2015 also gave pt oral contrast today for CT

## 2013-06-29 NOTE — Patient Instructions (Signed)
[place] Discharge Instructions for Patients Receiving Blood  Today you received the following 2 units of Red Blood Cells.  Call the clinic.   BELOW ARE SYMPTOMS THAT SHOULD BE REPORTED IMMEDIATELY:  *FEVER GREATER THAN 100.5 F  *CHILLS WITH OR WITHOUT FEVER  NAUSEA AND VOMITING THAT IS NOT CONTROLLED WITH YOUR NAUSEA MEDICATION  *UNUSUAL SHORTNESS OF BREATH  *UNUSUAL BRUISING OR BLEEDING  TENDERNESS IN MOUTH AND THROAT WITH OR WITHOUT PRESENCE OF ULCERS  *URINARY PROBLEMS  *BOWEL PROBLEMS  UNUSUAL RASH Items with * indicate a potential emergency and should be followed up as soon as possible.  Feel free to call the clinic you have any questions or concerns. The clinic phone number is [phone].  Blood Transfusion Information WHAT IS A BLOOD TRANSFUSION? A transfusion is the replacement of blood or some of its parts. Blood is made up of multiple cells which provide different functions.  Red blood cells carry oxygen and are used for blood loss replacement.  White blood cells fight against infection.  Platelets control bleeding.  Plasma helps clot blood.  Other blood products are available for specialized needs, such as hemophilia or other clotting disorders. BEFORE THE TRANSFUSION  Who gives blood for transfusions?   You may be able to donate blood to be used at a later date on yourself (autologous donation).  Relatives can be asked to donate blood. This is generally not any safer than if you have received blood from a stranger. The same precautions are taken to ensure safety when a relative's blood is donated.  Healthy volunteers who are fully evaluated to make sure their blood is safe. This is blood bank blood. Transfusion therapy is the safest it has ever been in the practice of medicine. Before blood is taken from a donor, a complete history is taken to make sure that person has no history of diseases nor engages in risky social behavior  (examples are intravenous drug use or sexual activity with multiple partners). The donor's travel history is screened to minimize risk of transmitting infections, such as malaria. The donated blood is tested for signs of infectious diseases, such as HIV and hepatitis. The blood is then tested to be sure it is compatible with you in order to minimize the chance of a transfusion reaction. If you or a relative donates blood, this is often done in anticipation of surgery and is not appropriate for emergency situations. It takes many days to process the donated blood. RISKS AND COMPLICATIONS Although transfusion therapy is very safe and saves many lives, the main dangers of transfusion include:   Getting an infectious disease.  Developing a transfusion reaction. This is an allergic reaction to something in the blood you were given. Every precaution is taken to prevent this. The decision to have a blood transfusion has been considered carefully by your caregiver before blood is given. Blood is not given unless the benefits outweigh the risks. AFTER THE TRANSFUSION  Right after receiving a blood transfusion, you will usually feel much better and more energetic. This is especially true if your red blood cells have gotten low (anemic). The transfusion raises the level of the red blood cells which carry oxygen, and this usually causes an energy increase.  The nurse administering the transfusion will monitor you carefully for complications. HOME CARE INSTRUCTIONS  No special instructions are needed after a transfusion. You may find your energy is better. Speak with your caregiver about any limitations on activity for underlying diseases  you may have. SEEK MEDICAL CARE IF:   Your condition is not improving after your transfusion.  You develop redness or irritation at the intravenous (IV) site. SEEK IMMEDIATE MEDICAL CARE IF:  Any of the following symptoms occur over the next 12 hours:  Shaking  chills.  You have a temperature by mouth above 102 F (38.9 C), not controlled by medicine.  Chest, back, or muscle pain.  People around you feel you are not acting correctly or are confused.  Shortness of breath or difficulty breathing.  Dizziness and fainting.  You get a rash or develop hives.  You have a decrease in urine output.  Your urine turns a dark color or changes to pink, red, or brown. Any of the following symptoms occur over the next 10 days:  You have a temperature by mouth above 102 F (38.9 C), not controlled by medicine.  Shortness of breath.  Weakness after normal activity.  The white part of the eye turns yellow (jaundice).  You have a decrease in the amount of urine or are urinating less often.  Your urine turns a dark color or changes to pink, red, or brown. Document Released: 04/05/2000 Document Revised: 07/01/2011 Document Reviewed: 11/23/2007 [facility] Patient Information 2014 [place].

## 2013-06-29 NOTE — Progress Notes (Signed)
[facility]  [phone] Fax:([phone]) [phone]   OFFICE PROGRESS NOTE  Eilleen Kempf., MD [place] [id number]  DIAGNOSIS:    Limited stage small cell lung cancer diagnosed in October 2014.  PRIOR THERAPY: Systemic chemotherapy with cisplatin at 60 mg per meter square given on day 1 and etoposide at 120 mg/m2 given on days 1, 2 and 3 with Neulasta support given on day 4 every 3 weeks. Status post 6 cycles, last dose was given 01/28/2014. She also received concurrent radiotherapy in [place], [place].   CURRENT THERAPY: Observation.  DISEASE STAGE: Limited stage  CHEMOTHERAPY INTENT: Control  CURRENT # OF CHEMOTHERAPY CYCLES: 0  CURRENT ANTIEMETICS: Compazine, Phenergan, dexamethasone, Zofran, Aloxi, Emend  CURRENT SMOKING STATUS: Former smoker, quit 01/30/2013  ORAL CHEMOTHERAPY AND CONSENT: n/a  CURRENT BISPHOSPHONATES USE: none  PAIN MANAGEMENT: none  NARCOTICS INDUCED CONSTIPATION: None  LIVING WILL AND CODE STATUS: Full Code   INTERVAL HISTORY: [name] 58 y.o. female returns for a scheduled follow up visit accompanied by her friend. The patient is feeling better today except for occasional fatigue and nausea. She is currently on Ativan for nausea treatment. She denied having any significant chest pain, shortness breath, cough or hemoptysis. She denied having any fever or chills. She has no significant weight loss or night sweats. She tolerated the last cycle of her treatment with cisplatin and etoposide fairly well except for the fatigue. She had repeat CT scan of the chest performed recently and she is here for evaluation and discussion of her scan results.  MEDICAL HISTORY: Past Medical History  Diagnosis Date  . Tobacco abuse   . Diabetes mellitus   . Mitral regurgitation   . Hypertension   . Dyslipidemia   . Coronary artery disease   . History of echocardiogram 7/10    EF 50-55%; inferior hypokinesis;  RV function mild decreased  . ACE-inhibitor cough   . Multiple allergies     Penicillin and mushrooms  . Cancer     lung ca    ALLERGIES:  is allergic to codeine and penicillins.  MEDICATIONS:  Current Outpatient Prescriptions  Medication Sig Dispense Refill  . albuterol (PROVENTIL HFA;VENTOLIN HFA) 108 (90 BASE) MCG/ACT inhaler Inhale 2 puffs into the lungs every 6 (six) hours as needed for wheezing or shortness of breath.  1 Inhaler  2  . aspirin 81 MG tablet Take 81 mg by mouth at bedtime.       . B Complex-C (SUPER B COMPLEX PO) Take 1 tablet by mouth daily.      . isosorbide mononitrate (IMDUR) 30 MG 24 hr tablet Take 30 mg by mouth daily.        Marland Kitchen LORazepam (ATIVAN) 0.5 MG tablet 1 tablet by mouth or sublingually every 8 hours as needed for nausea uncontrollerd by Phenergan or Zofran  30 tablet  0  . lovastatin (MEVACOR) 20 MG tablet Take 20 mg by mouth at bedtime.      . magnesium oxide (MAG-OX) 400 (241.3 MG) MG tablet Take 1 tablet (400 mg total) by mouth 3 (three) times daily.  90 tablet  0  . metFORMIN (GLUCOPHAGE) 850 MG tablet Take 850 mg by mouth 2 (two) times daily with a meal.      . oxyCODONE-acetaminophen (PERCOCET/ROXICET) 5-325 MG per tablet Take 1 tablet by mouth every 4 (four) hours as needed for severe pain.      . promethazine (PHENERGAN) 25 MG tablet Take 25 mg  by mouth every 6 (six) hours as needed. TAKE ONE TABLET BY MOUTH EVERY 6 HOURS AS NEEDED NAUSEA       No current facility-administered medications for this visit.    SURGICAL HISTORY:  Past Surgical History  Procedure Laterality Date  . Video bronchoscopy Bilateral 01/26/2013    Procedure: VIDEO BRONCHOSCOPY WITHOUT FLUORO;  Surgeon: Tanda Rockers, MD;  Location: [facility];  Service: Cardiopulmonary;  Laterality: Bilateral;    REVIEW OF SYSTEMS:  Constitutional: positive for fatigue Eyes: negative Ears, nose, mouth, throat, and face: negative Respiratory: positive for dyspnea on  exertion Cardiovascular: negative Gastrointestinal: positive for nausea Genitourinary:negative Integument/breast: negative Hematologic/lymphatic: negative Musculoskeletal:negative Neurological: negative Behavioral/Psych: negative   PHYSICAL EXAMINATION: General appearance: alert, cooperative, appears stated age and no distress Head: Normocephalic, without obvious abnormality, atraumatic Neck: no adenopathy, no carotid bruit, no JVD, supple, symmetrical, trachea midline and thyroid not enlarged, symmetric, no tenderness/mass/nodules Lymph nodes: Cervical, supraclavicular, and axillary nodes normal. Resp: clear to auscultation bilaterally Cardio: regular rate and rhythm, S1, S2 normal, no murmur, click, rub or gallop GI: soft, non-tender; bowel sounds normal; no masses,  no organomegaly Extremities: extremities normal, atraumatic, no cyanosis or edema Neurologic: Alert and oriented X 3, normal strength and tone. Normal symmetric reflexes. Normal coordination and gait  ECOG PERFORMANCE STATUS: 1 - Symptomatic but completely ambulatory  Blood pressure 126/67, pulse 103, temperature 98.2 F (36.8 C), temperature source Oral, resp. rate 19, height 5\' 6"  (1.676 m), weight 150 lb (68.04 kg), SpO2 100.00%.  LABORATORY DATA: Lab Results  Component Value Date   WBC 5.1 06/28/2013   HGB 7.5* 06/28/2013   HCT 22.5* 06/28/2013   MCV 100.8 06/28/2013   PLT 203 06/28/2013      Chemistry      Component Value Date/Time   NA 140 06/28/2013 1109   NA 133* 06/11/2013 1610   K 4.3 06/28/2013 1109   K 3.5* 06/11/2013 1610   CL 89* 06/11/2013 1610   CO2 28 06/28/2013 1109   CO2 28 06/11/2013 1610   BUN 15.2 06/28/2013 1109   BUN 26* 06/11/2013 1610   CREATININE 1.3* 06/28/2013 1109   CREATININE 1.60* 06/11/2013 1610      Component Value Date/Time   CALCIUM 9.3 06/28/2013 1109   CALCIUM 9.5 06/11/2013 1610   ALKPHOS 56 06/28/2013 1109   ALKPHOS 85 06/11/2013 1610   AST 17 06/28/2013 1109   AST 15 06/11/2013 1610   ALT  10 06/28/2013 1109   ALT 14 06/11/2013 1610   BILITOT 0.37 06/28/2013 1109   BILITOT 0.4 06/11/2013 1610       RADIOGRAPHIC STUDIES:  Ct Chest W Contrast  06/28/2013   CLINICAL DATA:  Lung cancer diagnosed September 2014. Completed chemotherapy. Asymptomatic.  EXAM: CT CHEST WITH CONTRAST  TECHNIQUE: Multidetector CT imaging of the chest was performed during intravenous contrast administration.  CONTRAST:  25mL OMNIPAQUE IOHEXOL 300 MG/ML  SOLN  COMPARISON:  DG CHEST 2 VIEW dated 05/20/2013; CT CHEST W/CM dated 05/07/2013  FINDINGS: Spiculated right upper lobe pulmonary parenchymal irregular nodule with extension to the pleura is slightly decreased in size, now 1.4 x 1.3 cm image 15, previously 1.6 x 1.3 cm. Emphysematous changes are reidentified bilaterally. Stable tree-in-bud type nodules process predominantly in the right upper lobe which may indicate small airways infection although lymphangitic spread of tumor could appear similar. Otherwise, no new pulmonary consolidation, mass, or nodule is identified. Presumed right perihilar and right middle lobe paramediastinal radiation change is noted. Calcified hilar,  pretracheal, AP window, and subcarinal lymph nodes are reidentified. Representative subcarinal nodal conglomerate is stable, 1.7 cm image 29. Great vessels are normal in caliber. Anterior mediastinal presumed lymph node is smaller, 0.9 cm image 24 compared to 1.0 cm previously. More superiorly, substernal anterior mediastinal presumed node is also smaller at 0.5 cm image 20, previously 1.0 cm. Pretracheal nodal conglomerate now measures 1.3 cm image 20, 1.5 cm previously.  Heart size is normal. No pericardial or pleural effusion. Left anterior chest wall vascular collaterals are noted. Great vessels are normal in caliber. Mild nodularity of the adrenal glands is noted bilaterally but no measurable mass is seen. No new lytic or sclerotic osseous lesion.  IMPRESSION: Interval decrease in size of dominant  right upper lobe irregular spiculated nodule and representative perivascular/pretracheal lymphadenopathy, compatible with response to treatment. Other findings are stable as described above.   Electronically Signed   By: Conchita Paris M.D.   On: 06/28/2013 13:49   ASSESSMENT/PLAN: This is a very pleasant 58 years old white female th limited stage small cell lung cancer currently undergoing systemic chemotherapy with cisplatin and etoposide status post 6 cycles concurrent with radiotherapy with continuous improvement in her disease. Her chemotherapy was concurrent with radiotherapy which was performed in [place], [place]. Her recent CT scan of the chest showed continuous improvement in her disease. She still have some residual right upper lobe disease in addition to mediastinal lymphadenopathy but much improved compared to previous scans. I discussed the scan results and showed the images to the patient today. I recommended for her to continue on observation for now with repeat CT scan of the chest in 3 months. I also recommended for the patient to see a radiation oncologist in [place] for consideration of prophylactic irradiation. For the chemotherapy-induced anemia, I will arrange for the patient to receive 2 units of PRBCs transfusion today. I would see her back for followup visit in one month with repeat CT scan of the chest for restaging of her disease.  She was advised to call immediately if she has any concerning symptoms in the interval.  All questions were answered. The patient knows to call the clinic with any problems, questions or concerns. We can certainly see the patient much sooner if necessary.  I spent 15 minutes counseling the patient face to face. The total time spent in the appointment was 25 minutes.  Disclaimer: This note was dictated with voice recognition software. Similar sounding words can inadvertently be transcribed and may not be corrected upon  review.   Eilleen Kempf., MD 06/29/2013

## 2013-06-29 NOTE — Progress Notes (Signed)
1530 pt c/o being restless and unable to get in a comfortable position. Dr Julien Nordmann Rx ativan 0.5 sublingual. Given at 1532.

## 2013-06-30 ENCOUNTER — Ambulatory Visit: Payer: Self-pay

## 2013-06-30 ENCOUNTER — Other Ambulatory Visit: Payer: Self-pay | Admitting: *Deleted

## 2013-06-30 DIAGNOSIS — R112 Nausea with vomiting, unspecified: Secondary | ICD-10-CM

## 2013-06-30 LAB — TYPE AND SCREEN
ABO/RH(D): O POS
ANTIBODY SCREEN: NEGATIVE
UNIT DIVISION: 0
Unit division: 0

## 2013-06-30 MED ORDER — LORAZEPAM 0.5 MG PO TABS: ORAL_TABLET | ORAL | Status: DC

## 2013-07-09 ENCOUNTER — Telehealth: Payer: Self-pay | Admitting: Medical Oncology

## 2013-07-09 NOTE — Telephone Encounter (Signed)
Pt called back and said she has thrown up 5 times a day in p[ast 3 days-1/2 cup to a cup of emesis each time.  She had BM today, denies abdominal pain.  I instructed her to go to local ED. "I think I will wait to see if i can eat anything or drink anything. If I can't I will come to ED".

## 2013-07-09 NOTE — Telephone Encounter (Signed)
Husband called to report pt started vomiting after she started radiation on wed. I called Lilymarie to talk to her and had to leave a voice message to call me back. I called Val in radiation and she will follow up with [facility]EN.

## 2013-07-13 ENCOUNTER — Telehealth: Payer: Self-pay | Admitting: Medical Oncology

## 2013-07-13 ENCOUNTER — Encounter: Payer: Self-pay | Admitting: Physician Assistant

## 2013-07-13 ENCOUNTER — Other Ambulatory Visit: Payer: Self-pay | Admitting: Medical Oncology

## 2013-07-13 NOTE — Telephone Encounter (Signed)
Husband concerned about pts recent event of falling , and losing balance -falling forward when she walked toward him yesterday. He reports she is confused and does not know date. He reports her last emesis was Friday. She told him that she spoke to someone yesterday  at cancer center and was told to " eat more beans". He  does not think she spoke to anyone at [facility] yesterday. Per Burnetta Sabin I scheduled pt for labs and IVF tomorrow.[place] notified of mental status changes. I told husband to take Ticara to ED if her symptoms persist or get worse. Note to dr Julien Nordmann.

## 2013-07-13 NOTE — Progress Notes (Signed)
Was contacted by Dr. Eppie Gibson this morning regarding Ms. Dandy and her increased creatinine at 2.08. She is currently receiving prophylactic cranial irradiation in each and starting 07/07/2013. She is reports recurrent nausea and vomiting since approximate 07/06/2013. Dr. Isidore Moos was on at some low-dose dexamethasone to help the nausea and contacted me regarding possible IV fluid administration. We will have the patient come in tomorrow for repeat be met and a liter of IV fluids to address her renal insufficiency his last questionable dehydration related to her recurrent nausea and vomiting. This is likely to be multifactorial. She is status post 4 cycles of systemic chemotherapy with cisplatin and etoposide with Neulasta support last given 05/31/2013. The nausea and vomiting could be related to the prophylactic cranial radiation although to a lesser extent to delayed nausea and vomiting related to the cisplatin as this was now approximately 6 weeks ago.  Wynetta Emery, Jovanni Rash E, PA-C

## 2013-07-14 ENCOUNTER — Other Ambulatory Visit (HOSPITAL_COMMUNITY): Payer: Self-pay

## 2013-07-14 ENCOUNTER — Ambulatory Visit (HOSPITAL_BASED_OUTPATIENT_CLINIC_OR_DEPARTMENT_OTHER): Payer: Self-pay

## 2013-07-14 ENCOUNTER — Other Ambulatory Visit: Payer: Self-pay | Admitting: Physician Assistant

## 2013-07-14 ENCOUNTER — Encounter: Payer: Self-pay | Admitting: *Deleted

## 2013-07-14 ENCOUNTER — Other Ambulatory Visit: Payer: Self-pay

## 2013-07-14 ENCOUNTER — Other Ambulatory Visit (HOSPITAL_BASED_OUTPATIENT_CLINIC_OR_DEPARTMENT_OTHER): Payer: Self-pay

## 2013-07-14 VITALS — BP 92/58 | HR 97 | Temp 98.5°F | Resp 18

## 2013-07-14 DIAGNOSIS — C3490 Malignant neoplasm of unspecified part of unspecified bronchus or lung: Secondary | ICD-10-CM

## 2013-07-14 DIAGNOSIS — C7A1 Malignant poorly differentiated neuroendocrine tumors: Secondary | ICD-10-CM

## 2013-07-14 DIAGNOSIS — C3491 Malignant neoplasm of unspecified part of right bronchus or lung: Secondary | ICD-10-CM

## 2013-07-14 DIAGNOSIS — R270 Ataxia, unspecified: Secondary | ICD-10-CM

## 2013-07-14 DIAGNOSIS — N289 Disorder of kidney and ureter, unspecified: Secondary | ICD-10-CM

## 2013-07-14 LAB — CBC WITH DIFFERENTIAL/PLATELET
BASO%: 0.2 % (ref 0.0–2.0)
BASOS ABS: 0 10*3/uL (ref 0.0–0.1)
EOS ABS: 0.4 10*3/uL (ref 0.0–0.5)
EOS%: 6.6 % (ref 0.0–7.0)
HEMATOCRIT: 30.5 % — AB (ref 34.8–46.6)
HEMOGLOBIN: 10.2 g/dL — AB (ref 11.6–15.9)
LYMPH#: 0.7 10*3/uL — AB (ref 0.9–3.3)
LYMPH%: 11.8 % — ABNORMAL LOW (ref 14.0–49.7)
MCH: 31.2 pg (ref 25.1–34.0)
MCHC: 33.4 g/dL (ref 31.5–36.0)
MCV: 93.3 fL (ref 79.5–101.0)
MONO#: 0.4 10*3/uL (ref 0.1–0.9)
MONO%: 7.1 % (ref 0.0–14.0)
NEUT%: 74.3 % (ref 38.4–76.8)
NEUTROS ABS: 4.6 10*3/uL (ref 1.5–6.5)
Platelets: 131 10*3/uL — ABNORMAL LOW (ref 145–400)
RBC: 3.27 10*6/uL — ABNORMAL LOW (ref 3.70–5.45)
RDW: 15.5 % — ABNORMAL HIGH (ref 11.2–14.5)
WBC: 6.2 10*3/uL (ref 3.9–10.3)
nRBC: 0 % (ref 0–0)

## 2013-07-14 LAB — COMPREHENSIVE METABOLIC PANEL (CC13)
ALT: 8 U/L (ref 0–55)
ANION GAP: 12 meq/L — AB (ref 3–11)
AST: 14 U/L (ref 5–34)
Albumin: 4 g/dL (ref 3.5–5.0)
Alkaline Phosphatase: 61 U/L (ref 40–150)
BUN: 29.1 mg/dL — ABNORMAL HIGH (ref 7.0–26.0)
CALCIUM: 10.1 mg/dL (ref 8.4–10.4)
CO2: 24 mEq/L (ref 22–29)
Chloride: 104 mEq/L (ref 98–109)
Creatinine: 1.8 mg/dL — ABNORMAL HIGH (ref 0.6–1.1)
GLUCOSE: 158 mg/dL — AB (ref 70–140)
POTASSIUM: 4.5 meq/L (ref 3.5–5.1)
Sodium: 140 mEq/L (ref 136–145)
Total Bilirubin: 0.44 mg/dL (ref 0.20–1.20)
Total Protein: 7.5 g/dL (ref 6.4–8.3)

## 2013-07-14 LAB — MAGNESIUM (CC13): Magnesium: 1.7 mg/dl (ref 1.5–2.5)

## 2013-07-14 MED ORDER — SODIUM CHLORIDE 0.9 % IV SOLN
Freq: Once | INTRAVENOUS | Status: AC
  Administered 2013-07-14: 15:00:00 via INTRAVENOUS

## 2013-07-14 MED ORDER — ONDANSETRON 8 MG/50ML IVPB (CHCC)
8.0000 mg | Freq: Once | INTRAVENOUS | Status: AC
  Administered 2013-07-14: 8 mg via INTRAVENOUS

## 2013-07-14 MED ORDER — SODIUM CHLORIDE 0.9 % IJ SOLN
10.0000 mL | INTRAMUSCULAR | Status: DC | PRN
  Filled 2013-07-14: qty 10

## 2013-07-14 MED ORDER — HEPARIN SOD (PORK) LOCK FLUSH 100 UNIT/ML IV SOLN
500.0000 [IU] | Freq: Once | INTRAVENOUS | Status: DC
  Filled 2013-07-14: qty 5

## 2013-07-14 MED ORDER — ONDANSETRON 8 MG/NS 50 ML IVPB
INTRAVENOUS | Status: AC
  Filled 2013-07-14: qty 8

## 2013-07-14 NOTE — Patient Instructions (Signed)
Dehydration, Adult Dehydration is when you lose more fluids from the body than you take in. Vital organs like the kidneys, brain, and heart cannot function without a proper amount of fluids and salt. Any loss of fluids from the body can cause dehydration.  CAUSES   Vomiting.  Diarrhea.  Excessive sweating.  Excessive urine output.  Fever. SYMPTOMS  Mild dehydration  Thirst.  Dry lips.  Slightly dry mouth. Moderate dehydration  Very dry mouth.  Sunken eyes.  Skin does not bounce back quickly when lightly pinched and released.  Dark urine and decreased urine production.  Decreased tear production.  Headache. Severe dehydration  Very dry mouth.  Extreme thirst.  Rapid, weak pulse (more than 100 beats per minute at rest).  Cold hands and feet.  Not able to sweat in spite of heat and temperature.  Rapid breathing.  Blue lips.  Confusion and lethargy.  Difficulty being awakened.  Minimal urine production.  No tears. DIAGNOSIS  Your caregiver will diagnose dehydration based on your symptoms and your exam. Blood and urine tests will help confirm the diagnosis. The diagnostic evaluation should also identify the cause of dehydration. TREATMENT  Treatment of mild or moderate dehydration can often be done at home by increasing the amount of fluids that you drink. It is best to drink small amounts of fluid more often. Drinking too much at one time can make vomiting worse. Refer to the home care instructions below. Severe dehydration needs to be treated at the hospital where you will probably be given intravenous (IV) fluids that contain water and electrolytes. HOME CARE INSTRUCTIONS   Ask your caregiver about specific rehydration instructions.  Drink enough fluids to keep your urine clear or pale yellow.  Drink small amounts frequently if you have nausea and vomiting.  Eat as you normally do.  Avoid:  Foods or drinks high in sugar.  Carbonated  drinks.  Juice.  Extremely hot or cold fluids.  Drinks with caffeine.  Fatty, greasy foods.  Alcohol.  Tobacco.  Overeating.  Gelatin desserts.  Wash your hands well to avoid spreading bacteria and viruses.  Only take over-the-counter or prescription medicines for pain, discomfort, or fever as directed by your caregiver.  Ask your caregiver if you should continue all prescribed and over-the-counter medicines.  Keep all follow-up appointments with your caregiver. SEEK MEDICAL CARE IF:  You have abdominal pain and it increases or stays in one area (localizes).  You have a rash, stiff neck, or severe headache.  You are irritable, sleepy, or difficult to awaken.  You are weak, dizzy, or extremely thirsty. SEEK IMMEDIATE MEDICAL CARE IF:   You are unable to keep fluids down or you get worse despite treatment.  You have frequent episodes of vomiting or diarrhea.  You have blood or green matter (bile) in your vomit.  You have blood in your stool or your stool looks black and tarry.  You have not urinated in 6 to 8 hours, or you have only urinated a small amount of very dark urine.  You have a fever.  You faint. MAKE SURE YOU:   Understand these instructions.  Will watch your condition.  Will get help right away if you are not doing well or get worse. Document Released: 04/08/2005 Document Revised: 07/01/2011 Document Reviewed: 11/26/2010 Exit[employer] Patient Information 2014 [employer].  

## 2013-07-14 NOTE — Progress Notes (Signed)
Patient states dizziness occurs most often when she goes from sitting to standing. Orthostatic VS taken and charted. Awilda Metro, PA notified of this. Cindi Carbon, RN

## 2013-07-19 ENCOUNTER — Other Ambulatory Visit: Payer: Self-pay | Admitting: Internal Medicine

## 2013-07-21 ENCOUNTER — Other Ambulatory Visit (HOSPITAL_COMMUNITY): Payer: Self-pay

## 2013-07-30 ENCOUNTER — Encounter: Payer: Self-pay | Admitting: Internal Medicine

## 2013-08-03 ENCOUNTER — Encounter: Payer: Self-pay | Admitting: Internal Medicine

## 2013-08-04 ENCOUNTER — Telehealth: Payer: Self-pay | Admitting: Internal Medicine

## 2013-08-04 ENCOUNTER — Telehealth: Payer: Self-pay | Admitting: *Deleted

## 2013-08-04 DIAGNOSIS — R112 Nausea with vomiting, unspecified: Secondary | ICD-10-CM

## 2013-08-04 NOTE — Addendum Note (Signed)
Addended by: Britt Bottom on: 08/04/2013 12:04 PM   Modules accepted: Orders

## 2013-08-04 NOTE — Telephone Encounter (Signed)
S/w ginger from dr sandi fields office and they will schedule the appt and notify the pt. The office is on the epic system.

## 2013-08-04 NOTE — Telephone Encounter (Addendum)
Pt called back, informed her regarding the gastroenterologist referral.  She does not have a gastroenterologist but she Dr Barney Drain is one in [place].  Per Dr Vista Mink, okay to make STAT referral.  SLJ

## 2013-08-04 NOTE — Telephone Encounter (Signed)
Pt has been vomiting daily even with taking ativan q8h and phenergan q6h.  Her last chemo was in February.  Her emesis ranges from a green color to the food she has been eating.  She states she is trying to stay hydrated and drinking fluids.   Will discuss with Dr Vista Mink and call pt back.  Per Dr Vista Mink, the chemo should be out of her system, she needs to see a gastroenterologist.  Attempted to call pt back, no answer.  Left msg to call us back.  SLJ

## 2013-09-02 ENCOUNTER — Encounter: Payer: Self-pay | Admitting: Gastroenterology

## 2013-09-02 ENCOUNTER — Other Ambulatory Visit: Payer: Self-pay | Admitting: Gastroenterology

## 2013-09-02 ENCOUNTER — Ambulatory Visit (INDEPENDENT_AMBULATORY_CARE_PROVIDER_SITE_OTHER): Payer: Self-pay | Admitting: Gastroenterology

## 2013-09-02 VITALS — BP 102/68 | HR 84 | Temp 98.1°F | Ht 66.0 in | Wt 144.0 lb

## 2013-09-02 DIAGNOSIS — R1319 Other dysphagia: Secondary | ICD-10-CM | POA: Insufficient documentation

## 2013-09-02 DIAGNOSIS — K5900 Constipation, unspecified: Secondary | ICD-10-CM

## 2013-09-02 DIAGNOSIS — R1310 Dysphagia, unspecified: Secondary | ICD-10-CM | POA: Insufficient documentation

## 2013-09-02 DIAGNOSIS — D649 Anemia, unspecified: Secondary | ICD-10-CM

## 2013-09-02 DIAGNOSIS — R112 Nausea with vomiting, unspecified: Secondary | ICD-10-CM

## 2013-09-02 MED ORDER — LUBIPROSTONE 24 MCG PO CAPS: 24.0000 ug | ORAL_CAPSULE | Freq: Two times a day (BID) | ORAL | Status: DC

## 2013-09-02 MED ORDER — DEXLANSOPRAZOLE 60 MG PO CPDR: 60.0000 mg | DELAYED_RELEASE_CAPSULE | Freq: Every day | ORAL | Status: DC

## 2013-09-02 NOTE — Patient Instructions (Addendum)
1. Upper endoscopy with Dr. Oneida Alar. See separate instructions. 2. Start Dexilant one capsule before breakfast for your esophagus/stomach. Samples provided. Prescription sent to pharmacy. 3. Start amitiza 70mcg twice daily with food for constipation. Samples provided. Prescription sent to pharmacy. 4. Stop prilosec OTC.

## 2013-09-02 NOTE — Progress Notes (Signed)
Primary Care Physician:  Raiford Simmonds., PA-C  Primary Gastroenterologist:  Barney Drain, MD   Chief Complaint  Patient presents with  . Nausea  . Emesis  . Dysphagia    HPI:  [name] is a 58 y.o. female here for further evaluation of nausea, dysphagia/odynophagia.  Radiation for small cell lung cancer 05/2013 (30 sessions), radiation of brain 07/2013. Developed pain with swallowing after radiation associated with dysphagia. Hiccus or sneeze, helps the discomfort in chest. +N/V.weight went from 164 to 132 now back to 144. DM diagnosed in 2000. BM chronically constipated. Usually goes 3-4 weeks without BM. Move a little better now, BM once every 1-2 weeks. No melena, brbpr. Slight constant abdominal pain. Has not used oxycodone in two weeks. Hurts to swallow pills. No NSAIDs/ASA.  Finished chemo, ended around February. June gets labs, CTs.   No prior colonoscopy or EGD.   Current Outpatient Prescriptions  Medication Sig Dispense Refill  . albuterol (PROVENTIL HFA;VENTOLIN HFA) 108 (90 BASE) MCG/ACT inhaler Inhale 2 puffs into the lungs every 6 (six) hours as needed for wheezing or shortness of breath.  1 Inhaler  2  . aspirin 81 MG tablet Take 81 mg by mouth at bedtime.       . B Complex-C (SUPER B COMPLEX PO) Take 1 tablet by mouth daily.      . isosorbide mononitrate (IMDUR) 30 MG 24 hr tablet Take 30 mg by mouth daily.        Marland Kitchen LORazepam (ATIVAN) 0.5 MG tablet 1 tablet by mouth or sublingually every 8 hours as needed for nausea uncontrollerd by Phenergan or Zofran  30 tablet  0  . lovastatin (MEVACOR) 20 MG tablet Take 20 mg by mouth at bedtime.      . magnesium oxide (MAG-OX) 400 (241.3 MG) MG tablet TAKE ONE TABLET BY MOUTH THREE TIMES DAILY  90 tablet  0  . metFORMIN (GLUCOPHAGE) 850 MG tablet Take 850 mg by mouth 2 (two) times daily with a meal.      . oxyCODONE-acetaminophen (PERCOCET/ROXICET) 5-325 MG per tablet Take 1 tablet by mouth every 4 (four) hours as needed for  severe pain.      . promethazine (PHENERGAN) 25 MG tablet Take 25 mg by mouth every 6 (six) hours as needed. TAKE ONE TABLET BY MOUTH EVERY 6 HOURS AS NEEDED NAUSEA       No current facility-administered medications for this visit.    Allergies as of 09/02/2013 - Review Complete 09/02/2013  Allergen Reaction Noted  . Codeine Nausea And Vomiting 11/28/2008  . Penicillins Itching     Past Medical History  Diagnosis Date  . Tobacco abuse   . Diabetes mellitus   . Mitral regurgitation   . Hypertension   . Dyslipidemia   . Coronary artery disease   . History of echocardiogram 7/10    EF 50-55%; inferior hypokinesis; RV function mild decreased  . ACE-inhibitor cough   . Multiple allergies     Penicillin and mushrooms  . Cancer     lung ca    Past Surgical History  Procedure Laterality Date  . Video bronchoscopy Bilateral 01/26/2013    Procedure: VIDEO BRONCHOSCOPY WITHOUT FLUORO;  Surgeon: Tanda Rockers, MD;  Location: [facility];  Service: Cardiopulmonary;  Laterality: Bilateral;  . Eye surgery      Family History  Problem Relation Age of Onset  . Stroke Mother   . Coronary artery disease Father     History   Social History  .  Marital Status: Married    Spouse Name: N/A    Number of Children: N/A  . Years of Education: N/A   Occupational History  . Not on file.   Social History Main Topics  . Smoking status: Former Smoker -- 1.50 packs/day for 40 years    Types: Cigarettes    Quit date: 01/30/2013  . Smokeless tobacco: Never Used  . Alcohol Use: No  . Drug Use: No     Comment: Quit 1975  . Sexual Activity: Not on file   Other Topics Concern  . Not on file   Social History Narrative   Pt gets regular exercise.      ROS:  General: Negative for fever, chills, fatigue, weakness.see hpi Eyes: Negative for vision changes.  ENT: Negative for hoarseness, nasal congestion.see hpi CV: Negative for chest pain, angina, palpitations, dyspnea on exertion,  peripheral edema.  Respiratory: Negative for dyspnea at rest, dyspnea on exertion, cough, sputum, wheezing.  GI: See history of present illness. GU:  Negative for dysuria, hematuria, urinary incontinence, urinary frequency, nocturnal urination.  MS: Negative for joint pain, low back pain.  Derm: Negative for rash or itching.  Neuro: Negative for weakness, abnormal sensation, seizure, frequent headaches, memory loss, confusion.  Psych: Negative for anxiety, depression, suicidal ideation, hallucinations.  Endo:see hpi Heme: Negative for bruising or bleeding. Allergy: Negative for rash or hives.    Physical Examination:  BP 102/68  Pulse 84  Temp(Src) 98.1 F (36.7 C) (Oral)  Ht 5\' 6"  (1.676 m)  Wt 144 lb (65.318 kg)  BMI 23.25 kg/m2   General: Well-nourished, well-developed in no acute distress.  Head: Normocephalic, atraumatic.   Eyes: Conjunctiva pale, no icterus. Mouth: Oropharyngeal mucosa moist and pink , no lesions erythema or exudate. Neck: Supple without thyromegaly, masses, or lymphadenopathy.  Lungs: Clear to auscultation bilaterally.  Heart: Regular rate and rhythm, no murmurs rubs or gallops.  Abdomen: Bowel sounds are normal, nontender, nondistended, no hepatosplenomegaly or masses, no abdominal bruits or    hernia , no rebound or guarding.   Rectal: not performed Extremities: No lower extremity edema. No clubbing or deformities.  Neuro: Alert and oriented x 4 , grossly normal neurologically.  Skin: Warm and dry, no rash or jaundice.   Psych: Alert and cooperative, normal mood and affect.  Labs: Lab Results  Component Value Date   WBC 6.2 07/14/2013   HGB 10.2* 07/14/2013   HCT 30.5* 07/14/2013   MCV 93.3 07/14/2013   PLT 131* 07/14/2013   Lab Results  Component Value Date   CREATININE 1.8* 07/14/2013   BUN 29.1* 07/14/2013   NA 140 07/14/2013   K 4.5 07/14/2013   CL 89* 06/11/2013   CO2 24 07/14/2013   Lab Results  Component Value Date   ALT 8 07/14/2013    AST 14 07/14/2013   ALKPHOS 61 07/14/2013   BILITOT 0.44 07/14/2013     Imaging Studies: No results found.

## 2013-09-04 DIAGNOSIS — K5900 Constipation, unspecified: Secondary | ICD-10-CM | POA: Insufficient documentation

## 2013-09-04 NOTE — Assessment & Plan Note (Signed)
Anemia s/p blood transfusion in setting of chemo/XRT. EGD as planned. Colonoscopy at later date when patient stronger.

## 2013-09-04 NOTE — Assessment & Plan Note (Addendum)
Dysphagia/odynophagia/nausea with recent XRT to chest. Ddx includes radiation induced esophagitis, viral esophagitis, GERD, stricture. Needs EGD +/- ED in near future.  I have discussed the risks, alternatives, benefits with regards to but not limited to the risk of reaction to medication, bleeding, infection, perforation and the patient is agreeable to proceed. Written consent to be obtained.  Stop prilosec OTC. Start Dexilant.

## 2013-09-04 NOTE — Assessment & Plan Note (Signed)
Start Amitiza.

## 2013-09-06 NOTE — Progress Notes (Signed)
cc'd to pcp 

## 2013-09-07 ENCOUNTER — Encounter (HOSPITAL_COMMUNITY): Payer: Self-pay | Admitting: *Deleted

## 2013-09-07 ENCOUNTER — Ambulatory Visit (HOSPITAL_COMMUNITY)
Admission: RE | Admit: 2013-09-07 | Discharge: 2013-09-07 | Disposition: A | Discharge status: Home / Self Care | Payer: Medicaid Other | Source: Ambulatory Visit | Attending: Gastroenterology | Admitting: Gastroenterology

## 2013-09-07 ENCOUNTER — Encounter (HOSPITAL_COMMUNITY)
Admission: RE | Disposition: A | Discharge status: Home / Self Care | Payer: Self-pay | Source: Ambulatory Visit | Attending: Gastroenterology

## 2013-09-07 DIAGNOSIS — I10 Essential (primary) hypertension: Secondary | ICD-10-CM | POA: Insufficient documentation

## 2013-09-07 DIAGNOSIS — K2960 Other gastritis without bleeding: Secondary | ICD-10-CM | POA: Insufficient documentation

## 2013-09-07 DIAGNOSIS — E119 Type 2 diabetes mellitus without complications: Secondary | ICD-10-CM | POA: Insufficient documentation

## 2013-09-07 DIAGNOSIS — K2980 Duodenitis without bleeding: Secondary | ICD-10-CM | POA: Insufficient documentation

## 2013-09-07 DIAGNOSIS — Z880 Allergy status to penicillin: Secondary | ICD-10-CM | POA: Insufficient documentation

## 2013-09-07 DIAGNOSIS — Z79899 Other long term (current) drug therapy: Secondary | ICD-10-CM | POA: Insufficient documentation

## 2013-09-07 DIAGNOSIS — R112 Nausea with vomiting, unspecified: Secondary | ICD-10-CM

## 2013-09-07 DIAGNOSIS — E785 Hyperlipidemia, unspecified: Secondary | ICD-10-CM | POA: Insufficient documentation

## 2013-09-07 DIAGNOSIS — R634 Abnormal weight loss: Secondary | ICD-10-CM | POA: Insufficient documentation

## 2013-09-07 DIAGNOSIS — K208 Other esophagitis without bleeding: Secondary | ICD-10-CM | POA: Insufficient documentation

## 2013-09-07 DIAGNOSIS — Z885 Allergy status to narcotic agent status: Secondary | ICD-10-CM | POA: Insufficient documentation

## 2013-09-07 DIAGNOSIS — I2510 Atherosclerotic heart disease of native coronary artery without angina pectoris: Secondary | ICD-10-CM | POA: Insufficient documentation

## 2013-09-07 DIAGNOSIS — C3490 Malignant neoplasm of unspecified part of unspecified bronchus or lung: Secondary | ICD-10-CM | POA: Insufficient documentation

## 2013-09-07 DIAGNOSIS — K2210 Ulcer of esophagus without bleeding: Secondary | ICD-10-CM | POA: Insufficient documentation

## 2013-09-07 DIAGNOSIS — R1319 Other dysphagia: Secondary | ICD-10-CM

## 2013-09-07 DIAGNOSIS — D649 Anemia, unspecified: Secondary | ICD-10-CM

## 2013-09-07 DIAGNOSIS — R1310 Dysphagia, unspecified: Secondary | ICD-10-CM

## 2013-09-07 DIAGNOSIS — K259 Gastric ulcer, unspecified as acute or chronic, without hemorrhage or perforation: Secondary | ICD-10-CM | POA: Insufficient documentation

## 2013-09-07 DIAGNOSIS — F17200 Nicotine dependence, unspecified, uncomplicated: Secondary | ICD-10-CM | POA: Insufficient documentation

## 2013-09-07 HISTORY — PX: ESOPHAGOGASTRODUODENOSCOPY: SHX5428

## 2013-09-07 HISTORY — PX: MALONEY DILATION: SHX5535

## 2013-09-07 HISTORY — PX: SAVORY DILATION: SHX5439

## 2013-09-07 LAB — KOH PREP: KOH Prep: NONE SEEN

## 2013-09-07 LAB — GLUCOSE, CAPILLARY: Glucose-Capillary: 114 mg/dL — ABNORMAL HIGH (ref 70–99)

## 2013-09-07 SURGERY — EGD (ESOPHAGOGASTRODUODENOSCOPY)
Anesthesia: Moderate Sedation

## 2013-09-07 MED ORDER — FENTANYL CITRATE 0.05 MG/ML IJ SOLN
INTRAMUSCULAR | Status: AC
  Filled 2013-09-07: qty 2

## 2013-09-07 MED ORDER — LIDOCAINE VISCOUS 2 % MT SOLN: OROMUCOSAL | Status: DC

## 2013-09-07 MED ORDER — MINERAL OIL PO OIL
TOPICAL_OIL | ORAL | Status: AC
  Filled 2013-09-07: qty 30

## 2013-09-07 MED ORDER — MIDAZOLAM HCL 5 MG/5ML IJ SOLN
INTRAMUSCULAR | Status: DC | PRN
  Administered 2013-09-07 (×3): 2 mg via INTRAVENOUS

## 2013-09-07 MED ORDER — MIDAZOLAM HCL 5 MG/5ML IJ SOLN
INTRAMUSCULAR | Status: AC
  Filled 2013-09-07: qty 10

## 2013-09-07 MED ORDER — STERILE WATER FOR IRRIGATION IR SOLN
Status: DC | PRN
  Administered 2013-09-07: 10:00:00

## 2013-09-07 MED ORDER — SODIUM CHLORIDE 0.9 % IV SOLN
INTRAVENOUS | Status: DC
  Administered 2013-09-07: 09:00:00 via INTRAVENOUS

## 2013-09-07 MED ORDER — LIDOCAINE VISCOUS 2 % MT SOLN
OROMUCOSAL | Status: AC
  Filled 2013-09-07: qty 15

## 2013-09-07 MED ORDER — FENTANYL CITRATE 0.05 MG/ML IJ SOLN
INTRAMUSCULAR | Status: DC | PRN
  Administered 2013-09-07 (×3): 25 ug via INTRAVENOUS

## 2013-09-07 MED ORDER — LIDOCAINE VISCOUS 2 % MT SOLN
OROMUCOSAL | Status: DC | PRN
  Administered 2013-09-07: 3 mL via OROMUCOSAL

## 2013-09-07 NOTE — H&P (Signed)
Primary Care Physician:  Raiford Simmonds., PA-C Primary Gastroenterologist:  Dr. Oneida Alar  Pre-Procedure History & Physical: HPI:  [name] is a 58 y.o. female here for NAUSEA/VOMITING/odynophagia-wants to eat but food makes her chest hurt.  Past Medical History  Diagnosis Date  . Tobacco abuse   . Diabetes mellitus   . Mitral regurgitation   . Hypertension   . Dyslipidemia   . Coronary artery disease   . History of echocardiogram 7/10    EF 50-55%; inferior hypokinesis; RV function mild decreased  . ACE-inhibitor cough   . Multiple allergies     Penicillin and mushrooms  . Cancer     limited stage small cell lung    Past Surgical History  Procedure Laterality Date  . Video bronchoscopy Bilateral 01/26/2013    Procedure: VIDEO BRONCHOSCOPY WITHOUT FLUORO;  Surgeon: Tanda Rockers, MD;  Location: [facility];  Service: Cardiopulmonary;  Laterality: Bilateral;  . Eye surgery    . Bilateral oophorectomy  2012    benign tumors    Prior to Admission medications   Medication Sig Start Date End Date Taking? Authorizing Provider  albuterol (PROVENTIL HFA;VENTOLIN HFA) 108 (90 BASE) MCG/ACT inhaler Inhale 2 puffs into the lungs every 6 (six) hours as needed for wheezing or shortness of breath. 03/17/13  Yes Curt Bears, MD  aspirin 81 MG tablet Take 81 mg by mouth at bedtime.    Yes Historical Provider, MD  B Complex-C (SUPER B COMPLEX PO) Take 1 tablet by mouth daily.   Yes Historical Provider, MD  dexlansoprazole (DEXILANT) 60 MG capsule Take 1 capsule (60 mg total) by mouth daily. 09/02/13  Yes Mahala Menghini, PA-C  isosorbide mononitrate (IMDUR) 30 MG 24 hr tablet Take 30 mg by mouth daily.     Yes Historical Provider, MD  LORazepam (ATIVAN) 0.5 MG tablet 1 tablet by mouth or sublingually every 8 hours as needed for nausea uncontrollerd by Phenergan or Zofran 06/30/13  Yes Curt Bears, MD  lovastatin (MEVACOR) 20 MG tablet Take 20 mg by mouth at bedtime.   Yes  Historical Provider, MD  magnesium oxide (MAG-OX) 400 (241.3 MG) MG tablet TAKE ONE TABLET BY MOUTH THREE TIMES DAILY 07/19/13  Yes Curt Bears, MD  metFORMIN (GLUCOPHAGE) 850 MG tablet Take 850 mg by mouth 2 (two) times daily with a meal.   Yes Historical Provider, MD  oxyCODONE-acetaminophen (PERCOCET/ROXICET) 5-325 MG per tablet Take 1 tablet by mouth every 4 (four) hours as needed for severe pain.   Yes Historical Provider, MD  promethazine (PHENERGAN) 25 MG tablet Take 25 mg by mouth every 6 (six) hours as needed. TAKE ONE TABLET BY MOUTH EVERY 6 HOURS AS NEEDED NAUSEA 05/17/13  Yes Curt Bears, MD  lubiprostone [facility]) 24 MCG capsule Take 1 capsule (24 mcg total) by mouth 2 (two) times daily with a meal. 09/02/13   Mahala Menghini, PA-C    Allergies as of 09/02/2013 - Review Complete 09/02/2013  Allergen Reaction Noted  . Codeine Nausea And Vomiting 11/28/2008  . Penicillins Itching     Family History  Problem Relation Age of Onset  . Stroke Mother   . Coronary artery disease Father     History   Social History  . Marital Status: Married    Spouse Name: N/A    Number of Children: N/A  . Years of Education: N/A   Occupational History  . Not on file.   Social History Main Topics  . Smoking status: Former Smoker --  1.50 packs/day for 40 years    Types: Cigarettes    Quit date: 01/30/2013  . Smokeless tobacco: Never Used  . Alcohol Use: No  . Drug Use: No     Comment: Quit 1975  . Sexual Activity: Not on file   Other Topics Concern  . Not on file   Social History Narrative   Pt gets regular exercise.    Review of Systems: See HPI, otherwise negative ROS   Physical Exam: BP 114/59  Pulse 77  Temp(Src) 98.7 F (37.1 C) (Oral)  Resp 14  SpO2 100% General:   Alert,  pleasant and cooperative in NAD Head:  Normocephalic and atraumatic. Neck:  Supple; Lungs:  Clear throughout to auscultation.    Heart:  Regular rate and rhythm. Abdomen:  Soft,  nontender and nondistended. Normal bowel sounds, without guarding, and without rebound.   Neurologic:  Alert and  oriented x4;  grossly normal neurologically.  Impression/Plan:     NAUSEA/VOMITING/odynophagia-wants to eat but food makes her chest hurt.Marland Kitchen  PLAN: EGD TODAY

## 2013-09-07 NOTE — Op Note (Signed)
[facility] [address] [place], [address]   ENDOSCOPY PROCEDURE REPORT  PATIENT: [name], [name]  MR#: [id number] BIRTHDATE: 1955-11-04 , 73  yrs. old GENDER: Female  ENDOSCOPIST: Barney Drain, MD REFERRED YY:QMGNOIB Julien Nordmann, M.D.  [place], [place]  PROCEDURE DATE: 09/07/2013 PROCEDURE:   EGD w/ biopsy INDICATIONS:Odynophagia.   Nausea.   Vomiting.   Weight loss. MEDICATIONS: Fentanyl 75 mcg IV and Versed 6 mg IV TOPICAL ANESTHETIC:   Viscous Xylocaine  DESCRIPTION OF PROCEDURE:     Physical exam was performed.  Informed consent was obtained from the patient after explaining the benefits, risks, and alternatives to the procedure.  The patient was connected to the monitor and placed in the left lateral position.  Continuous oxygen was provided by nasal cannula and IV medicine administered through an indwelling cannula.  After administration of sedation, the patients esophagus was intubated and the EG-[id number] ([id number])  endoscope was advanced under direct visualization to the second portion of the duodenum.  The scope was removed slowly by carefully examining the color, texture, anatomy, and integrity of the mucosa on the way out.  The patient was recovered in endoscopy and discharged home in satisfactory condition.   ESOPHAGUS: UES 14 CM FROM THE TEETH.  FROM 23 CM TO 25 CM FROM THE TEETH THERE IS ULCERATED ESOPHAGEAL MUCOSA THAT IS CIRCUMFERENTIAL. BIOPSIES OBTAINED VIA COLD FORCEPS.  FEW WHITE PLAQUES IN DISTAL ESOPHAGUS.  BRUSH BIOPSIES OBTAINED FOR KOH.   STOMACH: Mild erosive gastritis (inflammation) was found in the gastric antrum and at the pylorus.  Multiple biopsies were performed using cold forceps.   DUODENUM: Moderate duodenal inflammation was found in the duodenal bulb.   The duodenal mucosa showed no abnormalities in the 2nd part of the duodenum. COMPLICATIONS:   None  ENDOSCOPIC IMPRESSION: 1.   ODYNOPHAGIA DUE TO ESOPHAGEAL ULCER 2.    NAUSEA AND VOMITING DUE TO ULCERATIVE ESOPHAGISTIS, MILD Erosive gastritis, AND MODERTAE DUODENITIS  RECOMMENDATIONS: HOLD ASA FOR 2 WEEKS. DRINK BOOST, ENSURE, OR CARNATION INSTANT BREAKFAST THREE OR FOUR TIMES A DAY. CONTINUE DEXILANT. CRUSH YOUR PILLS IF POSSIBLE.  CHECK WITH PHARMACIST. VISCOUS LIDOCQINE 10 ML QAC/HS FOLLOW A SOFT MECHANICAL DIET.  MEATS SHOULD BE CHOPPED OR GROUND. VEGETABLES NEED TO BE SOFT LIKE MASHED POTATOES. BIOPSY WILL BE BACK IN 2-3 DAYS.  FOLLOW UP IN 6 WEEKS.  REPEAT EXAM: _______________________________ Lorrin MaisBarney Drain, MD 09/07/2013 11:27 AM

## 2013-09-07 NOTE — Discharge Instructions (Signed)
YOU HAVE AN ULCER IN YOUR MID ESOPHAGUS. IT IS CAUSING YOU TO HAVE PAIN WHEN YOU SWALLOW. YOUR NAUSEA AND VOMITING MAY BE DUE TO gastritis & DUODENITIS FROM YOUR USING ASPIRIN.  I biopsied your ESOPHAGUS, & stomach.   HOLD ASPIRIN FOR 2 WEEKS.  DRINK BOOST, ENSURE, OR CARNATION INSTANT BREAKFAST THREE OR FOUR TIMES A DAY.  CONTINUE DEXILANT.  CRUSH YOUR PILLS IF POSSIBLE. CHECK WITH YOUR PHARMACIST.  FOLLOW A SOFT MECHANICAL DIET. SEE INFO BELOW. MEATS SHOULD BE CHOPPED OR GROUND. VEGETABLES NEED TO BE SOFT LIKE MASHED POTATOES.  YOUR BIOPSY WILL BE BACK IN 2-3 DAYS.  FOLLOW UP IN 6 WEEKS.   UPPER ENDOSCOPY AFTER CARE Read the instructions outlined below and refer to this sheet in the next week. These discharge instructions provide you with general information on caring for yourself after you leave the hospital. While your treatment has been planned according to the most current medical practices available, unavoidable complications occasionally occur. If you have any problems or questions after discharge, call DR. Pancho Rushing, [phone].  ACTIVITY  You may resume your regular activity, but move at a slower pace for the next 24 hours.   Take frequent rest periods for the next 24 hours.   Walking will help get rid of the air and reduce the bloated feeling in your belly (abdomen).   No driving for 24 hours (because of the medicine (anesthesia) used during the test).   You may shower.   Do not sign any important legal documents or operate any machinery for 24 hours (because of the anesthesia used during the test).    NUTRITION  Drink plenty of fluids.   You may resume your normal diet as instructed by your doctor.   Begin with a light meal and progress to your normal diet. Heavy or fried foods are harder to digest and may make you feel sick to your stomach (nauseated).   Avoid alcoholic beverages for 24 hours or as instructed.    MEDICATIONS  You may resume your normal  medications.   WHAT YOU CAN EXPECT TODAY  Some feelings of bloating in the abdomen.   Passage of more gas than usual.    IF YOU HAD A BIOPSY TAKEN DURING THE UPPER ENDOSCOPY:  Eat a soft diet IF YOU HAVE NAUSEA, BLOATING, ABDOMINAL PAIN, OR VOMITING.    FINDING OUT THE RESULTS OF YOUR TEST Not all test results are available during your visit. DR. Oneida Alar WILL CALL YOU WITHIN 7 DAYS OF YOUR PROCEDUE WITH YOUR RESULTS. Do not assume everything is normal if you have not heard from DR. Kable Haywood IN ONE WEEK, CALL HER OFFICE AT [phone].  SEEK IMMEDIATE MEDICAL ATTENTION AND CALL THE OFFICE: [phone] IF:  You have more than a spotting of blood in your stool.   Your belly is swollen (abdominal distention).   You are nauseated or vomiting.   You have a temperature over 101F.   You have abdominal pain or discomfort that is severe or gets worse throughout the day.   Gastritis/DUODENITIS  Gastritis is an inflammation (the body's way of reacting to injury and/or infection) of the stomach. DUODENITIS is an inflammation (the body's way of reacting to injury and/or infection) of the FIRST PART OF THE SMALL INTESTINES. It is often caused by bacterial (germ) infections. It can also be caused BY ASPIRIN, BC/GOODY POWDER'S, (IBUPROFEN) MOTRIN, OR ALEVE (NAPROXEN), chemicals (including alcohol), SPICY FOODS, and medications. This illness may be associated with generalized malaise (feeling tired, not  well), UPPER ABDOMINAL STOMACH cramps, and fever. One common bacterial cause of gastritis is an organism known as H. Pylori. This can be treated with antibiotics.     SOFT MECHANICAL DIET This SOFT MECHANICAL DIET is restricted to:  Foods that are moist, soft-textured, and easy to chew and swallow.   Meats that are ground or are minced no larger than one-quarter inch pieces. Meats are moist with gravy or sauce added.   Foods that do not include bread or bread-like textures except soft  pancakes, well-moistened with syrup or sauce.   Textures with some chewing ability required.   Casseroles without rice.   Cooked vegetables that are less than half an inch in size and easily mashed with a fork. No cooked corn, peas, broccoli, cauliflower, cabbage, Brussels sprouts, asparagus, or other fibrous, non-tender or rubbery cooked vegetables.   Canned fruit except for pineapple. Fruit must be cut into pieces no larger than half an inch in size.   Foods that do not include nuts, seeds, coconut, or sticky textures.   FOOD TEXTURES FOR DYSPHAGIA DIET LEVEL 2 -SOFT MECHANICAL DIET (includes all foods on Dysphagia Diet Level 1 - Pureed, in addition to the foods listed below)  FOOD GROUP: Breads. RECOMMENDED: Soft pancakes, well-moistened with syrup or sauce.  AVOID: All others.  FOOD GROUP: Cereals.  RECOMMENDED: Cooked cereals with little texture, including oatmeal. Unprocessed wheat bran stirred into cereals for bulk. Note: If thin liquids are restricted, it is important that all of the liquid is absorbed into the cereal.  AVOID: All dry cereals and any cooked cereals that may contain flax seeds or other seeds or nuts. Whole-grain, dry, or coarse cereals. Cereals with nuts, seeds, dried fruit, and/or coconut.  FOOD GROUP: Desserts. RECOMMENDED: Pudding, custard. Soft fruit pies with bottom crust only. Canned fruit (excluding pineapple). Soft, moist cakes with icing.Frozen malts, milk shakes, frozen yogurt, eggnog, nutritional supplements, ice cream, sherbet, regular or sugar-free gelatin, or any foods that become thin liquid at either room (70 F) or body temperature (98 F).  AVOID: Dry, coarse cakes and cookies. Anything with nuts, seeds, coconut, pineapple, or dried fruit. Breakfast yogurt with nuts. Rice or bread pudding.  FOOD GROUP: Fats. RECOMMENDED: Butter, margarine, cream for cereal (depending on liquid consistency recommendations), gravy, cream sauces, sour cream, sour  cream dips with soft additives, mayonnaise, salad dressings, cream cheese, cream cheese spreads with soft additives, whipped toppings.  AVOID: All fats with coarse or chunky additives.  FOOD GROUP: Fruits. RECOMMENDED: Soft drained, canned, or cooked fruits without seeds or skin. Fresh soft and ripe banana. Fruit juices with a small amount of pulp. If thin liquids are restricted, fruit juices should be thickened to appropriate consistency.  AVOID: Fresh or frozen fruits. Cooked fruit with skin or seeds. Dried fruits. Fresh, canned, or cooked pineapple.  FOOD GROUP: Meats and Meat Substitutes. (Meat pieces should not exceed 1/4 of an inch cube and should be tender.) RECOMMENDED: Moistened ground or cooked meat, poultry, or fish. Moist ground or tender meat may be served with gravy or sauce. Casseroles without rice. Moist macaroni and cheese, well-cooked pasta with meat sauce, tuna noodle casserole, soft, moist lasagna. Moist meatballs, meatloaf, or fish loaf. Protein salads, such as tuna or egg without large chunks, celery, or onion. Cottage cheese, smooth quiche without large chunks. Poached, scrambled, or soft-cooked eggs (egg yolks should not be runny but should be moist and able to be mashed with butter, margarine, or other moisture added to them). Lacinda Axon  eggs to 160 F or use pasteurized eggs for safety.) Souffls may have small, soft chunks. Tofu. Well-cooked, slightly mashed, moist legumes, such as baked beans. All meats or protein substitutes should be served with sauces or moistened to help maintain cohesiveness in the oral cavity.  AVOID: Dry meats, tough meats (such as bacon, sausage, hot dogs, bratwurst). Dry casseroles or casseroles with rice or large chunks. Peanut butter. Cheese slices and cubes. Hard-cooked or crisp fried eggs. Sandwiches.Pizza.  FOOD GROUP: Potatoes and Starches. RECOMMENDED: Well-cooked, moistened, boiled, baked, or mashed potatoes. Well-cooked shredded hash brown  potatoes that are not crisp. (All potatoes need to be moist and in sauces.)Well-cooked noodles in sauce. Spaetzel or soft dumplings that have been moistened with butter or gravy.  AVOID: Potato skins and chips. Fried or French-fried potatoes. Rice.  FOOD GROUP: Soups. RECOMMENDED: Soups with easy-to-chew or easy-to-swallow meats or vegetables: Particle sizes in soups should be less than 1/2 inch. Soups will need to be thickened to appropriate consistency if soup is thinner than prescribed liquid consistency.  AVOID: Soups with large chunks of meat and vegetables. Soups with rice, corn, peas.  FOOD GROUP: Vegetables. RECOMMENDED: All soft, well-cooked vegetables. Vegetables should be less than a half inch. Should be easily mashed with a fork.  AVOID: Cooked corn and peas. Broccoli, cabbage, Brussels sprouts, asparagus, or other fibrous, non-tender or rubbery cooked vegetables.  FOOD GROUP: Miscellaneous. RECOMMENDED: Jams and preserves without seeds, jelly. Sauces, salsas, etc., that may have small tender chunks less than 1/2 inch. Soft, smooth chocolate bars that are easily chewed.  AVOID: Seeds, nuts, coconut, or sticky foods. Chewy candies such as caramels or licorice.

## 2013-09-07 NOTE — Progress Notes (Signed)
REVIEWED.  

## 2013-09-08 ENCOUNTER — Encounter (HOSPITAL_COMMUNITY): Payer: Self-pay | Admitting: Gastroenterology

## 2013-09-14 ENCOUNTER — Telehealth: Payer: Self-pay | Admitting: Gastroenterology

## 2013-09-14 NOTE — Telephone Encounter (Signed)
I called pt and told her I have not received the results from her biopsy from Dr. Oneida Alar, but I will let her know that she has called.

## 2013-09-14 NOTE — Telephone Encounter (Signed)
Pt called today saying that she was supposed to have gotten her results Friday from [facility] but she hasn't heard from [place]. I told her that SF wasn't here Friday and we were closed Monday for the Holiday, but I would let the nurse know that she was following up on her results and have her call patient back. [phone] or [phone]

## 2013-09-15 NOTE — Telephone Encounter (Signed)
Pt has OV for 6/30 at 11 with AS per CS

## 2013-09-15 NOTE — Telephone Encounter (Addendum)
PLEASE CALL PT. HER ESOPHAGEAL BIOPSIES SHOW JUST AN ULCER. IT MAY BE DUE TO RADIATION. SHE DOES NOT HAVE AN INFECTION IN HER ESOPHAGUS. I WILL DISCUSS WITH DR. MOHAMED. HER STOMACH BIOPSIES SHOW AN ULCER MOST LIKELY DUE TO ASPIRIN USE.   HOLD ASPIRIN FOR ANOTHER WEEK.  DRINK BOOST, ENSURE, OR CARNATION INSTANT BREAKFAST THREE OR FOUR TIMES A DAY.  CONTINUE DEXILANT.  CRUSH YOUR PILLS IF POSSIBLE. CHECK WITH YOUR PHARMACIST.  FOLLOW A SOFT MECHANICAL DIET. SEE INFO BELOW. MEATS SHOULD BE CHOPPED OR GROUND. VEGETABLES NEED TO BE SOFT LIKE MASHED POTATOES.  FOLLOW UP IN 6 WEEKS E30 ODYNOPHAGIA.

## 2013-09-16 ENCOUNTER — Telehealth: Payer: Self-pay | Admitting: Gastroenterology

## 2013-09-16 NOTE — Telephone Encounter (Signed)
Patient said that she was told that she would be getting her results in a day or two and was wondering why we haven't called her yet. I told her that they shouldn't have told her that because normally it takes 5-7 business days and sometimes longer, but I would be glad to see if DS has received those results yet if SF has reviewed them. Pt agreed. Please call her at [phone]

## 2013-09-16 NOTE — Telephone Encounter (Signed)
Pt is aware of results. 

## 2013-09-16 NOTE — Telephone Encounter (Signed)
LMOM to call.

## 2013-09-16 NOTE — Telephone Encounter (Signed)
See result note of 09/15/2013. Pt aware of results.

## 2013-09-17 ENCOUNTER — Telehealth: Payer: Self-pay

## 2013-09-17 NOTE — Telephone Encounter (Signed)
Pt called and asked me to go over her results again. She is aware of the ulcers and the soft mechanical diet. She is also aware of her appt on 10/19/2013 with Laban Emperor, NP.

## 2013-09-28 ENCOUNTER — Ambulatory Visit (HOSPITAL_COMMUNITY)
Admission: RE | Admit: 2013-09-28 | Discharge: 2013-09-28 | Disposition: A | Discharge status: Home / Self Care | Payer: Medicaid Other | Source: Ambulatory Visit | Attending: Internal Medicine | Admitting: Internal Medicine

## 2013-09-28 ENCOUNTER — Other Ambulatory Visit: Payer: Self-pay | Admitting: Internal Medicine

## 2013-09-28 ENCOUNTER — Other Ambulatory Visit (HOSPITAL_BASED_OUTPATIENT_CLINIC_OR_DEPARTMENT_OTHER): Payer: Medicaid Other

## 2013-09-28 ENCOUNTER — Other Ambulatory Visit: Payer: Self-pay

## 2013-09-28 DIAGNOSIS — J438 Other emphysema: Secondary | ICD-10-CM | POA: Insufficient documentation

## 2013-09-28 DIAGNOSIS — Z9221 Personal history of antineoplastic chemotherapy: Secondary | ICD-10-CM | POA: Insufficient documentation

## 2013-09-28 DIAGNOSIS — M899 Disorder of bone, unspecified: Secondary | ICD-10-CM | POA: Insufficient documentation

## 2013-09-28 DIAGNOSIS — C3490 Malignant neoplasm of unspecified part of unspecified bronchus or lung: Secondary | ICD-10-CM

## 2013-09-28 DIAGNOSIS — K7689 Other specified diseases of liver: Secondary | ICD-10-CM | POA: Insufficient documentation

## 2013-09-28 DIAGNOSIS — M949 Disorder of cartilage, unspecified: Secondary | ICD-10-CM

## 2013-09-28 DIAGNOSIS — C7A090 Malignant carcinoid tumor of the bronchus and lung: Secondary | ICD-10-CM

## 2013-09-28 DIAGNOSIS — K8689 Other specified diseases of pancreas: Secondary | ICD-10-CM | POA: Insufficient documentation

## 2013-09-28 DIAGNOSIS — R112 Nausea with vomiting, unspecified: Secondary | ICD-10-CM | POA: Insufficient documentation

## 2013-09-28 DIAGNOSIS — I2510 Atherosclerotic heart disease of native coronary artery without angina pectoris: Secondary | ICD-10-CM | POA: Insufficient documentation

## 2013-09-28 DIAGNOSIS — Z923 Personal history of irradiation: Secondary | ICD-10-CM | POA: Insufficient documentation

## 2013-09-28 DIAGNOSIS — I700 Atherosclerosis of aorta: Secondary | ICD-10-CM | POA: Insufficient documentation

## 2013-09-28 DIAGNOSIS — N200 Calculus of kidney: Secondary | ICD-10-CM | POA: Insufficient documentation

## 2013-09-28 DIAGNOSIS — R911 Solitary pulmonary nodule: Secondary | ICD-10-CM | POA: Insufficient documentation

## 2013-09-28 DIAGNOSIS — J9819 Other pulmonary collapse: Secondary | ICD-10-CM | POA: Insufficient documentation

## 2013-09-28 LAB — COMPREHENSIVE METABOLIC PANEL (CC13)
ALT: 13 U/L (ref 0–55)
AST: 17 U/L (ref 5–34)
Albumin: 3.9 g/dL (ref 3.5–5.0)
Alkaline Phosphatase: 58 U/L (ref 40–150)
Anion Gap: 11 mEq/L (ref 3–11)
BUN: 30.1 mg/dL — AB (ref 7.0–26.0)
CALCIUM: 10 mg/dL (ref 8.4–10.4)
CHLORIDE: 104 meq/L (ref 98–109)
CO2: 24 mEq/L (ref 22–29)
CREATININE: 2 mg/dL — AB (ref 0.6–1.1)
GLUCOSE: 165 mg/dL — AB (ref 70–140)
Potassium: 5.1 mEq/L (ref 3.5–5.1)
Sodium: 138 mEq/L (ref 136–145)
Total Bilirubin: 0.47 mg/dL (ref 0.20–1.20)
Total Protein: 7.8 g/dL (ref 6.4–8.3)

## 2013-09-28 LAB — CBC WITH DIFFERENTIAL/PLATELET
BASO%: 0.3 % (ref 0.0–2.0)
Basophils Absolute: 0 10*3/uL (ref 0.0–0.1)
EOS%: 3 % (ref 0.0–7.0)
Eosinophils Absolute: 0.2 10*3/uL (ref 0.0–0.5)
HCT: 28.1 % — ABNORMAL LOW (ref 34.8–46.6)
HGB: 9.6 g/dL — ABNORMAL LOW (ref 11.6–15.9)
LYMPH%: 7.5 % — ABNORMAL LOW (ref 14.0–49.7)
MCH: 33.4 pg (ref 25.1–34.0)
MCHC: 34.3 g/dL (ref 31.5–36.0)
MCV: 97.3 fL (ref 79.5–101.0)
MONO#: 0.7 10*3/uL (ref 0.1–0.9)
MONO%: 10.3 % (ref 0.0–14.0)
NEUT#: 5.1 10*3/uL (ref 1.5–6.5)
NEUT%: 78.9 % — AB (ref 38.4–76.8)
Platelets: 207 10*3/uL (ref 145–400)
RBC: 2.88 10*6/uL — ABNORMAL LOW (ref 3.70–5.45)
RDW: 14 % (ref 11.2–14.5)
WBC: 6.5 10*3/uL (ref 3.9–10.3)
lymph#: 0.5 10*3/uL — ABNORMAL LOW (ref 0.9–3.3)

## 2013-09-29 ENCOUNTER — Encounter: Payer: Self-pay | Admitting: Internal Medicine

## 2013-09-29 ENCOUNTER — Telehealth: Payer: Self-pay | Admitting: Internal Medicine

## 2013-09-29 ENCOUNTER — Ambulatory Visit (HOSPITAL_BASED_OUTPATIENT_CLINIC_OR_DEPARTMENT_OTHER): Payer: Medicaid Other | Admitting: Internal Medicine

## 2013-09-29 VITALS — BP 101/61 | HR 93 | Temp 99.4°F | Resp 17 | Ht 66.0 in | Wt 139.1 lb

## 2013-09-29 DIAGNOSIS — R599 Enlarged lymph nodes, unspecified: Secondary | ICD-10-CM

## 2013-09-29 DIAGNOSIS — C3490 Malignant neoplasm of unspecified part of unspecified bronchus or lung: Secondary | ICD-10-CM

## 2013-09-29 NOTE — Telephone Encounter (Signed)
[facility] APPT SCHEDULE FOR SEPT

## 2013-09-29 NOTE — Telephone Encounter (Signed)
RECEIVED CALL FROM ALICIA IN CENTRAL TO MOVE 9/2 LAB TO 10AM TO COORD W/CT. ALICIA WILL INFORM PT.

## 2013-09-29 NOTE — Progress Notes (Signed)
[name]  [phone] [phone]   OFFICE PROGRESS NOTE  MUSE,ROCHELLE D., PA-C [address] [name]  DIAGNOSIS:    Limited stage small cell lung cancer diagnosed in October 2014.  PRIOR THERAPY:  1) Systemic chemotherapy with cisplatin at 60 mg per meter square given on day 1 and etoposide at 120 mg/m2 given on days 1, 2 and 3 with Neulasta support given on day 4 every 3 weeks. Status post 6 cycles, last dose was given 01/28/2014. She also received concurrent radiotherapy in [place], [place]. 2) prophylactic cranial irradiation in [place], [place] under the care of Dr. Tammi Klippel.  CURRENT THERAPY: Observation.  DISEASE STAGE: Limited stage  CHEMOTHERAPY INTENT: Control  CURRENT # OF CHEMOTHERAPY CYCLES: 0  CURRENT ANTIEMETICS: Compazine, Phenergan, dexamethasone, Zofran, Aloxi, Emend  CURRENT SMOKING STATUS: Former smoker, quit 01/30/2013  ORAL CHEMOTHERAPY AND CONSENT: n/a  CURRENT BISPHOSPHONATES USE: none  PAIN MANAGEMENT: none  NARCOTICS INDUCED CONSTIPATION: None  LIVING WILL AND CODE STATUS: Full Code   INTERVAL HISTORY: [name] 58 y.o. female returns for a scheduled follow up visit accompanied by her friend. The patient is feeling better with no specific complaints except for mild fatigue. She tolerated the prophylactic cranial irradiation fairly well. She denied having any significant chest pain, shortness of breath, cough or hemoptysis. She denied having any fever or chills. She has no significant weight loss or night sweats. She has been on observation for the last 3 months. She had repeat CT scan of the chest, abdomen and pelvis performed recently and she is here for evaluation and discussion of her scan results.  MEDICAL HISTORY: Past Medical History  Diagnosis Date  . Tobacco abuse   . Diabetes mellitus   . Mitral regurgitation   . Hypertension   . Dyslipidemia   . Coronary  artery disease   . History of echocardiogram 7/10    EF 50-55%; inferior hypokinesis; RV function mild decreased  . ACE-inhibitor cough   . Multiple allergies     Penicillin and mushrooms  . Cancer     limited stage small cell lung    ALLERGIES:  is allergic to codeine and penicillins.  MEDICATIONS:  Current Outpatient Prescriptions  Medication Sig Dispense Refill  . B Complex-C (SUPER B COMPLEX PO) Take 1 tablet by mouth daily.      . isosorbide mononitrate (IMDUR) 30 MG 24 hr tablet Take 30 mg by mouth daily.        Marland Kitchen lovastatin (MEVACOR) 20 MG tablet Take 20 mg by mouth at bedtime.      . metFORMIN (GLUCOPHAGE) 850 MG tablet Take 850 mg by mouth 2 (two) times daily with a meal.       No current facility-administered medications for this visit.    SURGICAL HISTORY:  Past Surgical History  Procedure Laterality Date  . Video bronchoscopy Bilateral 01/26/2013    Procedure: VIDEO BRONCHOSCOPY WITHOUT FLUORO;  Surgeon: Tanda Rockers, MD;  Location: [facility];  Service: Cardiopulmonary;  Laterality: Bilateral;  . Eye surgery    . Bilateral oophorectomy  2012    benign tumors  . Esophagogastroduodenoscopy N/A 09/07/2013    Procedure: ESOPHAGOGASTRODUODENOSCOPY (EGD);  Surgeon: Danie Binder, MD;  Location: [facility];  Service: Endoscopy;  Laterality: N/A;  9:30  . Maloney dilation N/A 09/07/2013    Procedure: Venia Minks DILATION;  Surgeon: Danie Binder, MD;  Location: [facility];  Service: Endoscopy;  Laterality: N/A;  .  Savory dilation N/A 09/07/2013    Procedure: SAVORY DILATION;  Surgeon: Danie Binder, MD;  Location: [facility];  Service: Endoscopy;  Laterality: N/A;    REVIEW OF SYSTEMS:  Constitutional: positive for fatigue Eyes: negative Ears, nose, mouth, throat, and face: negative Respiratory: positive for dyspnea on exertion Cardiovascular: negative Gastrointestinal: positive for nausea Genitourinary:negative Integument/breast:  negative Hematologic/lymphatic: negative Musculoskeletal:negative Neurological: negative Behavioral/Psych: negative   PHYSICAL EXAMINATION: General appearance: alert, cooperative, appears stated age and no distress Head: Normocephalic, without obvious abnormality, atraumatic Neck: no adenopathy, no carotid bruit, no JVD, supple, symmetrical, trachea midline and thyroid not enlarged, symmetric, no tenderness/mass/nodules Lymph nodes: Cervical, supraclavicular, and axillary nodes normal. Resp: clear to auscultation bilaterally Cardio: regular rate and rhythm, S1, S2 normal, no murmur, click, rub or gallop GI: soft, non-tender; bowel sounds normal; no masses,  no organomegaly Extremities: extremities normal, atraumatic, no cyanosis or edema Neurologic: Alert and oriented X 3, normal strength and tone. Normal symmetric reflexes. Normal coordination and gait  ECOG PERFORMANCE STATUS: 1 - Symptomatic but completely ambulatory  Blood pressure 101/61, pulse 93, temperature 99.4 F (37.4 C), temperature source Oral, resp. rate 17, height 5\' 6"  (1.676 m), weight 139 lb 1.6 oz (63.095 kg), SpO2 100.00%.  LABORATORY DATA: Lab Results  Component Value Date   WBC 6.5 09/28/2013   HGB 9.6* 09/28/2013   HCT 28.1* 09/28/2013   MCV 97.3 09/28/2013   PLT 207 09/28/2013      Chemistry      Component Value Date/Time   NA 138 09/28/2013 1004   NA 133* 06/11/2013 1610   K 5.1 09/28/2013 1004   K 3.5* 06/11/2013 1610   CL 89* 06/11/2013 1610   CO2 24 09/28/2013 1004   CO2 28 06/11/2013 1610   BUN 30.1* 09/28/2013 1004   BUN 26* 06/11/2013 1610   CREATININE 2.0* 09/28/2013 1004   CREATININE 1.60* 06/11/2013 1610      Component Value Date/Time   CALCIUM 10.0 09/28/2013 1004   CALCIUM 9.5 06/11/2013 1610   ALKPHOS 58 09/28/2013 1004   ALKPHOS 85 06/11/2013 1610   AST 17 09/28/2013 1004   AST 15 06/11/2013 1610   ALT 13 09/28/2013 1004   ALT 14 06/11/2013 1610   BILITOT 0.47 09/28/2013 1004   BILITOT 0.4 06/11/2013 1610        RADIOGRAPHIC STUDIES:  Ct Chest Wo Contrast  09/28/2013   CLINICAL DATA:  Lung cancer diagnosed 9/14. Chemotherapy and radiation therapy complete. Hysterectomy. Nausea and vomiting. Small cell lung cancer.  EXAM: CT CHEST, ABDOMEN AND PELVIS WITHOUT CONTRAST  TECHNIQUE: Multidetector CT imaging of the chest, abdomen and pelvis was performed following the standard protocol without IV contrast.  COMPARISON:  Chest CT 06/28/2013. PET of 02/05/2013. Most recent abdominal pelvic CT of 01/21/2013. Most recent clinic note of 06/29/2012.  FINDINGS:   CT CHEST FINDINGS  Lungs/Pleura: Moderate centrilobular emphysema. Subpleural spiculated right upper lobe lung nodule. 1.3 x 1.0 cm on image 14 versus 1.4 x 1.3 cm on the prior exam.  Increased peribronchovascular nodular/interstitial opacity. Example image 25/series 4. Similar right middle lobe micro nodularity.  Similar to slight increase in the left-sided interstitial thickening.  Partial collapse of the medial right middle lobe is similar. Compression or collapse of the subtending bronchus.  No pleural fluid.  Heart/Mediastinum: A right low jugular/supraclavicular node measures 1.2 x 1.2 cm on image 6, new since the prior exam. Measured 1.0 cm on the 02/05/2013 PET.  No axillary adenopathy. Aortic and branch  vessel atherosclerosis. Mild pulmonary artery enlargement, with the outflow tract measuring 3.1 cm. Multivessel coronary artery atherosclerosis. Normal heart size, without pericardial effusion.  Extensive calcified mediastinal and right hilar lymph nodes, consistent with old granulomatous disease. A noncalcified right paratracheal node measures 8 mm on image 19 versus 1.3 cm on the prior. Hilar regions poorly evaluated without intravenous contrast. Index prevascular node measures 7 mm on image 24, decreased from 9 mm on the prior.    CT ABDOMEN AND PELVIS FINDINGS  Abdomen/Pelvis: Scattered low-density subcentimeter liver lesions which are similar to on  the prior exam and likely cysts. Decrease sensitivity for new hepatic lesions secondary to lack of IV contrast.  Normal noncontrast appearance of the spleen, stomach. Pancreatic atrophy. Normal gallbladder, biliary tract, right adrenal gland. Mild left adrenal thickening which is unchanged.  Mild bilateral renal cortical thinning. Punctate left renal collecting system stone. Age advanced aortic and branch vessel atherosclerosis. No retroperitoneal or retrocrural adenopathy. Normal colon and terminal ileum. Normal small bowel without abdominal ascites. No pelvic adenopathy. Normal urinary bladder and uterus, without adnexal mass or significant free pelvic fluid.  Bones/Musculoskeletal: Moderate osteopenia. No acute osseous abnormality.    IMPRESSION: CT CHEST IMPRESSION  1. Decreased size of right upper lobe lung nodule and thoracic lymph nodes, suggesting response to disease within the chest. 2. Low right cervical adenopathy which is new since 06/28/2013 and progressive since 02/05/2013. This is most consistent with disease progression. 3. Centrilobular emphysema with progressive reticular nodular opacities, primarily on the right. Correlate with infectious symptoms, as acute on chronic atypical infection could have this appearance. Differential considerations include drug toxicity. 4. Age advanced coronary artery atherosclerosis. Recommend assessment of coronary risk factors and consideration of medical therapy. 5. Pulmonary artery enlargement suggests pulmonary arterial hypertension.  CT ABDOMEN AND PELVIS IMPRESSION  1. No acute process or evidence of metastatic disease in the abdomen or pelvis. 2. Age advanced atherosclerosis. 3. Left nephrolithiasis.   Electronically Signed   By: Abigail Miyamoto M.D.   On: 09/28/2013 14:30   ASSESSMENT/PLAN: This is a very pleasant 58 years old white female th limited stage small cell lung cancer currently undergoing systemic chemotherapy with cisplatin and etoposide status  post 6 cycles concurrent with radiotherapy with continuous improvement in her disease. Her chemotherapy was concurrent with radiotherapy which was performed in [place], [place]. This was also followed by prophylactic cranial irradiation. Her recent CT scan showed no significant evidence for disease progression except for slightly enlarged low right cervical adenopathy. I discussed the scan results with the patient today. I recommended for her to continue on observation for now with repeat CT scan of the chest in 3 months. She was advised to call immediately if she has any concerning symptoms in the interval.  All questions were answered. The patient knows to call the clinic with any problems, questions or concerns. We can certainly see the patient much sooner if necessary.  Disclaimer: This note was dictated with voice recognition software. Similar sounding words can inadvertently be transcribed and may not be corrected upon review.   Eilleen Kempf., MD 09/29/2013

## 2013-10-03 ENCOUNTER — Emergency Department (HOSPITAL_COMMUNITY)
Admission: EM | Admit: 2013-10-03 | Discharge: 2013-10-03 | Disposition: A | Discharge status: Home / Self Care | Payer: Medicaid Other | Attending: Emergency Medicine | Admitting: Emergency Medicine

## 2013-10-03 ENCOUNTER — Emergency Department (HOSPITAL_COMMUNITY): Payer: Medicaid Other

## 2013-10-03 ENCOUNTER — Encounter (HOSPITAL_COMMUNITY): Payer: Self-pay | Admitting: Emergency Medicine

## 2013-10-03 DIAGNOSIS — E860 Dehydration: Secondary | ICD-10-CM

## 2013-10-03 DIAGNOSIS — I2510 Atherosclerotic heart disease of native coronary artery without angina pectoris: Secondary | ICD-10-CM | POA: Insufficient documentation

## 2013-10-03 DIAGNOSIS — E119 Type 2 diabetes mellitus without complications: Secondary | ICD-10-CM | POA: Insufficient documentation

## 2013-10-03 DIAGNOSIS — I10 Essential (primary) hypertension: Secondary | ICD-10-CM | POA: Insufficient documentation

## 2013-10-03 DIAGNOSIS — Z87891 Personal history of nicotine dependence: Secondary | ICD-10-CM | POA: Insufficient documentation

## 2013-10-03 DIAGNOSIS — R42 Dizziness and giddiness: Secondary | ICD-10-CM | POA: Insufficient documentation

## 2013-10-03 DIAGNOSIS — Z79899 Other long term (current) drug therapy: Secondary | ICD-10-CM | POA: Insufficient documentation

## 2013-10-03 DIAGNOSIS — E785 Hyperlipidemia, unspecified: Secondary | ICD-10-CM | POA: Insufficient documentation

## 2013-10-03 DIAGNOSIS — Z880 Allergy status to penicillin: Secondary | ICD-10-CM | POA: Insufficient documentation

## 2013-10-03 DIAGNOSIS — Z859 Personal history of malignant neoplasm, unspecified: Secondary | ICD-10-CM | POA: Insufficient documentation

## 2013-10-03 LAB — COMPREHENSIVE METABOLIC PANEL WITH GFR
ALT: 16 U/L (ref 0–35)
AST: 20 U/L (ref 0–37)
Albumin: 3.7 g/dL (ref 3.5–5.2)
Alkaline Phosphatase: 58 U/L (ref 39–117)
BUN: 32 mg/dL — ABNORMAL HIGH (ref 6–23)
CO2: 21 meq/L (ref 19–32)
Calcium: 9.9 mg/dL (ref 8.4–10.5)
Chloride: 99 meq/L (ref 96–112)
Creatinine, Ser: 1.94 mg/dL — ABNORMAL HIGH (ref 0.50–1.10)
GFR calc Af Amer: 32 mL/min — ABNORMAL LOW
GFR calc non Af Amer: 27 mL/min — ABNORMAL LOW
Glucose, Bld: 176 mg/dL — ABNORMAL HIGH (ref 70–99)
Potassium: 4.8 meq/L (ref 3.7–5.3)
Sodium: 135 meq/L — ABNORMAL LOW (ref 137–147)
Total Bilirubin: 0.4 mg/dL (ref 0.3–1.2)
Total Protein: 7.9 g/dL (ref 6.0–8.3)

## 2013-10-03 LAB — CBC WITH DIFFERENTIAL/PLATELET
Basophils Absolute: 0 10*3/uL (ref 0.0–0.1)
Basophils Relative: 0 % (ref 0–1)
EOS ABS: 0.1 10*3/uL (ref 0.0–0.7)
Eosinophils Relative: 1 % (ref 0–5)
HCT: 27.2 % — ABNORMAL LOW (ref 36.0–46.0)
HEMOGLOBIN: 9.3 g/dL — AB (ref 12.0–15.0)
LYMPHS PCT: 6 % — AB (ref 12–46)
Lymphs Abs: 0.4 10*3/uL — ABNORMAL LOW (ref 0.7–4.0)
MCH: 32.5 pg (ref 26.0–34.0)
MCHC: 34.2 g/dL (ref 30.0–36.0)
MCV: 95.1 fL (ref 78.0–100.0)
MONOS PCT: 8 % (ref 3–12)
Monocytes Absolute: 0.5 10*3/uL (ref 0.1–1.0)
NEUTROS PCT: 85 % — AB (ref 43–77)
Neutro Abs: 6 10*3/uL (ref 1.7–7.7)
Platelets: 218 10*3/uL (ref 150–400)
RBC: 2.86 MIL/uL — ABNORMAL LOW (ref 3.87–5.11)
RDW: 12.5 % (ref 11.5–15.5)
WBC: 7 10*3/uL (ref 4.0–10.5)

## 2013-10-03 LAB — URINALYSIS, ROUTINE W REFLEX MICROSCOPIC
BILIRUBIN URINE: NEGATIVE
Glucose, UA: NEGATIVE mg/dL
HGB URINE DIPSTICK: NEGATIVE
KETONES UR: NEGATIVE mg/dL
Nitrite: NEGATIVE
Protein, ur: NEGATIVE mg/dL
Specific Gravity, Urine: 1.009 (ref 1.005–1.030)
UROBILINOGEN UA: 1 mg/dL (ref 0.0–1.0)
pH: 5 (ref 5.0–8.0)

## 2013-10-03 LAB — I-STAT CG4 LACTIC ACID, ED: Lactic Acid, Venous: 1.42 mmol/L (ref 0.5–2.2)

## 2013-10-03 LAB — I-STAT TROPONIN, ED: Troponin i, poc: 0.01 ng/mL (ref 0.00–0.08)

## 2013-10-03 LAB — URINE MICROSCOPIC-ADD ON

## 2013-10-03 LAB — CBG MONITORING, ED: Glucose-Capillary: 138 mg/dL — ABNORMAL HIGH (ref 70–99)

## 2013-10-03 LAB — LIPASE, BLOOD: Lipase: 22 U/L (ref 11–59)

## 2013-10-03 MED ORDER — ONDANSETRON 8 MG PO TBDP: 8.0000 mg | ORAL_TABLET | Freq: Three times a day (TID) | ORAL | Status: AC | PRN

## 2013-10-03 MED ORDER — SODIUM CHLORIDE 0.9 % IV SOLN
1000.0000 mL | Freq: Once | INTRAVENOUS | Status: AC
  Administered 2013-10-03: 1000 mL via INTRAVENOUS

## 2013-10-03 MED ORDER — SODIUM CHLORIDE 0.9 % IV SOLN: 1000.0000 mL | INTRAVENOUS | Status: DC

## 2013-10-03 NOTE — Discharge Instructions (Signed)
Dehydration, Adult Dehydration is when you lose more fluids from the body than you take in. Vital organs like the kidneys, brain, and heart cannot function without a proper amount of fluids and salt. Any loss of fluids from the body can cause dehydration.  CAUSES   Vomiting.  Diarrhea.  Excessive sweating.  Excessive urine output.  Fever. SYMPTOMS  Mild dehydration  Thirst.  Dry lips.  Slightly dry mouth. Moderate dehydration  Very dry mouth.  Sunken eyes.  Skin does not bounce back quickly when lightly pinched and released.  Dark urine and decreased urine production.  Decreased tear production.  Headache. Severe dehydration  Very dry mouth.  Extreme thirst.  Rapid, weak pulse (more than 100 beats per minute at rest).  Cold hands and feet.  Not able to sweat in spite of heat and temperature.  Rapid breathing.  Blue lips.  Confusion and lethargy.  Difficulty being awakened.  Minimal urine production.  No tears. DIAGNOSIS  Your caregiver will diagnose dehydration based on your symptoms and your exam. Blood and urine tests will help confirm the diagnosis. The diagnostic evaluation should also identify the cause of dehydration. TREATMENT  Treatment of mild or moderate dehydration can often be done at home by increasing the amount of fluids that you drink. It is best to drink small amounts of fluid more often. Drinking too much at one time can make vomiting worse. Refer to the home care instructions below. Severe dehydration needs to be treated at the hospital where you will probably be given intravenous (IV) fluids that contain water and electrolytes. HOME CARE INSTRUCTIONS   Ask your caregiver about specific rehydration instructions.  Drink enough fluids to keep your urine clear or pale yellow.  Drink small amounts frequently if you have nausea and vomiting.  Eat as you normally do.  Avoid:  Foods or drinks high in sugar.  Carbonated  drinks.  Juice.  Extremely hot or cold fluids.  Drinks with caffeine.  Fatty, greasy foods.  Alcohol.  Tobacco.  Overeating.  Gelatin desserts.  Wash your hands well to avoid spreading bacteria and viruses.  Only take over-the-counter or prescription medicines for pain, discomfort, or fever as directed by your caregiver.  Ask your caregiver if you should continue all prescribed and over-the-counter medicines.  Keep all follow-up appointments with your caregiver. SEEK MEDICAL CARE IF:  You have abdominal pain and it increases or stays in one area (localizes).  You have a rash, stiff neck, or severe headache.  You are irritable, sleepy, or difficult to awaken.  You are weak, dizzy, or extremely thirsty. SEEK IMMEDIATE MEDICAL CARE IF:   You are unable to keep fluids down or you get worse despite treatment.  You have frequent episodes of vomiting or diarrhea.  You have blood or green matter (bile) in your vomit.  You have blood in your stool or your stool looks black and tarry.  You have not urinated in 6 to 8 hours, or you have only urinated a small amount of very dark urine.  You have a fever.  You faint. MAKE SURE YOU:   Understand these instructions.  Will watch your condition.  Will get help right away if you are not doing well or get worse. Document Released: 04/08/2005 Document Revised: 07/01/2011 Document Reviewed: 11/26/2010 [employer] Patient Information 2014 [employer].  

## 2013-10-03 NOTE — ED Notes (Signed)
MD Campos at bedside.  

## 2013-10-03 NOTE — ED Notes (Signed)
Pt from home c/o weakness and dizziness x a few days. Pt reports that she hasn't been eating and drinking much because it makes her nauseated and vomit. She has HX of ulcers in esophagus. She c/o abdominal pain as well. Last BM was Sunday one week ago and states that is normal for her.

## 2013-10-03 NOTE — ED Provider Notes (Signed)
CSN: [id number]     Arrival date & time 10/03/13  1050 History   First MD Initiated Contact with Patient 10/03/13 1117     Chief Complaint  Patient presents with  . Weakness  . Dizziness      HPI Patient has a history of weakness and dizziness over the past 5-7 days.  She's had decreased oral intake.  She's had nausea and vomiting.  She reports no diarrhea.  She's had some abdominal cramping.  She denies melena or hematochezia.  No hematemesis.  Patient is a diabetic and takes metformin.  She is status post radiation chemotherapy for small cell lung cancer.  She does have a history of esophageal and gastric ulcer secondary to radiation therapy.  Her symptoms are mild in severity.  She states she feels weak and somewhat dehydrated.  No urinary complaints.  No fevers or chills.  She no longer smokes cigarettes.  She does have a history of mitral regurg and is being followed by her team.   Past Medical History  Diagnosis Date  . Tobacco abuse   . Diabetes mellitus   . Mitral regurgitation   . Hypertension   . Dyslipidemia   . Coronary artery disease   . History of echocardiogram 7/10    EF 50-55%; inferior hypokinesis; RV function mild decreased  . ACE-inhibitor cough   . Multiple allergies     Penicillin and mushrooms  . Cancer     limited stage small cell lung   Past Surgical History  Procedure Laterality Date  . Video bronchoscopy Bilateral 01/26/2013    Procedure: VIDEO BRONCHOSCOPY WITHOUT FLUORO;  Surgeon: Tanda Rockers, MD;  Location: [facility];  Service: Cardiopulmonary;  Laterality: Bilateral;  . Eye surgery    . Bilateral oophorectomy  2012    benign tumors  . Esophagogastroduodenoscopy N/A 09/07/2013    Procedure: ESOPHAGOGASTRODUODENOSCOPY (EGD);  Surgeon: Danie Binder, MD;  [facility];  Service: Endoscopy;  Laterality: N/A;  9:30  . Maloney dilation N/A 09/07/2013    Procedure: Venia Minks DILATION;  Surgeon: Danie Binder, MD;  [facility];   Service: Endoscopy;  Laterality: N/A;  . Savory dilation N/A 09/07/2013    Procedure: SAVORY DILATION;  Surgeon: Danie Binder, MD;  Location[facility];  Service: Endoscopy;  Laterality: N/A;   Family History  Problem Relation Age of Onset  . Stroke Mother   . Coronary artery disease Father    History  Substance Use Topics  . Smoking status: Former Smoker -- 1.50 packs/day for 40 years    Types: Cigarettes    Quit date: 01/30/2013  . Smokeless tobacco: Never Used  . Alcohol Use: No   OB History   Grav Para Term Preterm Abortions TAB SAB Ect Mult Living                 Review of Systems  All other systems reviewed and are negative.     Allergies  Codeine and Penicillins  Home Medications   Prior to Admission medications   Medication Sig Start Date End Date Taking? Authorizing Provider  B Complex-C (SUPER B COMPLEX PO) Take 1 tablet by mouth daily.   Yes Historical Provider, MD  isosorbide mononitrate (IMDUR) 30 MG 24 hr tablet Take 30 mg by mouth every morning.    Yes Historical Provider, MD  lovastatin (MEVACOR) 20 MG tablet Take 20 mg by mouth at bedtime.   Yes Historical Provider, MD  metFORMIN (GLUCOPHAGE) 850  MG tablet Take 850 mg by mouth 2 (two) times daily with a meal.   Yes Historical Provider, MD  ondansetron (ZOFRAN ODT) 8 MG disintegrating tablet Take 1 tablet (8 mg total) by mouth every 8 (eight) hours as needed for nausea or vomiting. 10/03/13   Hoy Morn, MD   BP 105/59  Pulse 94  Temp(Src) 99.1 F (37.3 C) (Rectal)  Resp 15  SpO2 97% Physical Exam  Nursing note and vitals reviewed. Constitutional: She is oriented to person, place, and time. She appears well-developed and well-nourished. No distress.  HENT:  Head: Normocephalic and atraumatic.  Dry mucous membranes  Eyes: EOM are normal.  Neck: Normal range of motion.  Cardiovascular: Normal rate, regular rhythm and normal heart sounds.   Pulmonary/Chest: Effort normal and breath sounds  normal.  Abdominal: Soft. She exhibits no distension. There is no tenderness.  Musculoskeletal: Normal range of motion.  Neurological: She is alert and oriented to person, place, and time.  Skin: Skin is warm and dry.  Psychiatric: She has a normal mood and affect. Judgment normal.    ED Course  Procedures (including critical care time) Labs Review Labs Reviewed  CBC WITH DIFFERENTIAL - Abnormal; Notable for the following:    RBC 2.86 (*)    Hemoglobin 9.3 (*)    HCT 27.2 (*)    Neutrophils Relative % 85 (*)    Lymphocytes Relative 6 (*)    Lymphs Abs 0.4 (*)    All other components within normal limits  COMPREHENSIVE METABOLIC PANEL - Abnormal; Notable for the following:    Sodium 135 (*)    Glucose, Bld 176 (*)    BUN 32 (*)    Creatinine, Ser 1.94 (*)    GFR calc non Af Amer 27 (*)    GFR calc Af Amer 32 (*)    All other components within normal limits  URINALYSIS, ROUTINE W REFLEX MICROSCOPIC - Abnormal; Notable for the following:    Leukocytes, UA TRACE (*)    All other components within normal limits  URINE MICROSCOPIC-ADD ON - Abnormal; Notable for the following:    Casts HYALINE CASTS (*)    All other components within normal limits  CBG MONITORING, ED - Abnormal; Notable for the following:    Glucose-Capillary 138 (*)    All other components within normal limits  CULTURE, BLOOD (ROUTINE X 2)  CULTURE, BLOOD (ROUTINE X 2)  URINE CULTURE  LIPASE, BLOOD  I-STAT CG4 LACTIC ACID, ED  Randolm Idol, ED    Imaging Review Dg Chest 2 View  10/03/2013   CLINICAL DATA:  Weakness, nausea  EXAM: CHEST  2 VIEW  COMPARISON:  05/20/2013 and 09/28/2013  FINDINGS: Cardiomediastinal silhouette is stable. Again noted spiculated nodule in right upper lobe measures 1.3 cm. No acute infiltrate or pleural effusion. No pulmonary edema. Bony thorax is stable.  IMPRESSION: No active disease. Again noted spiculated nodule in right upper lobe measures 1.3 cm.   Electronically Signed    By: Lahoma Crocker M.D.   On: 10/03/2013 12:07     EKG Interpretation   Date/Time:  Sunday October 03 2013 11:16:51 EDT Ventricular Rate:  103 PR Interval:  139 QRS Duration: 85 QT Interval:  350 QTC Calculation: 458 R Axis:   76 Text Interpretation:  Sinus tachycardia Ventricular premature complex  Aberrant conduction of SV complex(es) Probable left atrial enlargement No  significant change was found Confirmed by Ericson Nafziger  MD, Amri Lien ([id number]) on  10/03/2013 11:53:09 AM  MDM   Final diagnoses:  Dehydration    2:42 PM Patient feels much better after IV fluids.  Her improvement in her blood pressure.  Likely dehydration.  Discharge home in good condition.  Abdominal exam is benign.  She understands return to the ER for new or worsening symptoms.  Baseline anemia for the patient.  Baseline renal function.    Hoy Morn, MD 10/03/13 [phone]

## 2013-10-04 LAB — URINE CULTURE

## 2013-10-05 ENCOUNTER — Encounter (HOSPITAL_COMMUNITY): Payer: Self-pay | Admitting: Emergency Medicine

## 2013-10-05 ENCOUNTER — Inpatient Hospital Stay (HOSPITAL_COMMUNITY)
Admission: EM | Admit: 2013-10-05 | Discharge: 2013-10-07 | DRG: 872 | Disposition: A | Discharge status: Home / Self Care | Payer: Medicaid Other | Attending: Internal Medicine | Admitting: Internal Medicine

## 2013-10-05 ENCOUNTER — Telehealth (HOSPITAL_BASED_OUTPATIENT_CLINIC_OR_DEPARTMENT_OTHER): Payer: Self-pay

## 2013-10-05 DIAGNOSIS — Z9221 Personal history of antineoplastic chemotherapy: Secondary | ICD-10-CM

## 2013-10-05 DIAGNOSIS — R5081 Fever presenting with conditions classified elsewhere: Secondary | ICD-10-CM

## 2013-10-05 DIAGNOSIS — I10 Essential (primary) hypertension: Secondary | ICD-10-CM

## 2013-10-05 DIAGNOSIS — E119 Type 2 diabetes mellitus without complications: Secondary | ICD-10-CM

## 2013-10-05 DIAGNOSIS — I080 Rheumatic disorders of both mitral and aortic valves: Secondary | ICD-10-CM

## 2013-10-05 DIAGNOSIS — Y842 Radiological procedure and radiotherapy as the cause of abnormal reaction of the patient, or of later complication, without mention of misadventure at the time of the procedure: Secondary | ICD-10-CM | POA: Diagnosis present

## 2013-10-05 DIAGNOSIS — E440 Moderate protein-calorie malnutrition: Secondary | ICD-10-CM

## 2013-10-05 DIAGNOSIS — R7881 Bacteremia: Secondary | ICD-10-CM

## 2013-10-05 DIAGNOSIS — E785 Hyperlipidemia, unspecified: Secondary | ICD-10-CM

## 2013-10-05 DIAGNOSIS — N184 Chronic kidney disease, stage 4 (severe): Secondary | ICD-10-CM | POA: Diagnosis present

## 2013-10-05 DIAGNOSIS — J449 Chronic obstructive pulmonary disease, unspecified: Secondary | ICD-10-CM

## 2013-10-05 DIAGNOSIS — Z85118 Personal history of other malignant neoplasm of bronchus and lung: Secondary | ICD-10-CM

## 2013-10-05 DIAGNOSIS — A419 Sepsis, unspecified organism: Principal | ICD-10-CM

## 2013-10-05 DIAGNOSIS — I129 Hypertensive chronic kidney disease with stage 1 through stage 4 chronic kidney disease, or unspecified chronic kidney disease: Secondary | ICD-10-CM | POA: Diagnosis present

## 2013-10-05 DIAGNOSIS — F17200 Nicotine dependence, unspecified, uncomplicated: Secondary | ICD-10-CM

## 2013-10-05 DIAGNOSIS — R1310 Dysphagia, unspecified: Secondary | ICD-10-CM

## 2013-10-05 DIAGNOSIS — N183 Chronic kidney disease, stage 3 unspecified: Secondary | ICD-10-CM

## 2013-10-05 DIAGNOSIS — Z888 Allergy status to other drugs, medicaments and biological substances status: Secondary | ICD-10-CM

## 2013-10-05 DIAGNOSIS — E860 Dehydration: Secondary | ICD-10-CM

## 2013-10-05 DIAGNOSIS — D630 Anemia in neoplastic disease: Secondary | ICD-10-CM | POA: Diagnosis present

## 2013-10-05 DIAGNOSIS — R6520 Severe sepsis without septic shock: Secondary | ICD-10-CM

## 2013-10-05 DIAGNOSIS — R1319 Other dysphagia: Secondary | ICD-10-CM

## 2013-10-05 DIAGNOSIS — D709 Neutropenia, unspecified: Secondary | ICD-10-CM

## 2013-10-05 DIAGNOSIS — I2510 Atherosclerotic heart disease of native coronary artery without angina pectoris: Secondary | ICD-10-CM

## 2013-10-05 DIAGNOSIS — Z8249 Family history of ischemic heart disease and other diseases of the circulatory system: Secondary | ICD-10-CM

## 2013-10-05 DIAGNOSIS — C3490 Malignant neoplasm of unspecified part of unspecified bronchus or lung: Secondary | ICD-10-CM

## 2013-10-05 DIAGNOSIS — K5900 Constipation, unspecified: Secondary | ICD-10-CM

## 2013-10-05 DIAGNOSIS — Z87891 Personal history of nicotine dependence: Secondary | ICD-10-CM

## 2013-10-05 DIAGNOSIS — D638 Anemia in other chronic diseases classified elsewhere: Secondary | ICD-10-CM

## 2013-10-05 DIAGNOSIS — Z823 Family history of stroke: Secondary | ICD-10-CM

## 2013-10-05 DIAGNOSIS — I959 Hypotension, unspecified: Secondary | ICD-10-CM | POA: Diagnosis present

## 2013-10-05 DIAGNOSIS — Z880 Allergy status to penicillin: Secondary | ICD-10-CM

## 2013-10-05 DIAGNOSIS — R918 Other nonspecific abnormal finding of lung field: Secondary | ICD-10-CM

## 2013-10-05 DIAGNOSIS — K2210 Ulcer of esophagus without bleeding: Secondary | ICD-10-CM | POA: Diagnosis present

## 2013-10-05 DIAGNOSIS — Z923 Personal history of irradiation: Secondary | ICD-10-CM

## 2013-10-05 HISTORY — DX: Reserved for concepts with insufficient information to code with codable children: IMO0002

## 2013-10-05 LAB — URINALYSIS, ROUTINE W REFLEX MICROSCOPIC
GLUCOSE, UA: NEGATIVE mg/dL
Hgb urine dipstick: NEGATIVE
KETONES UR: NEGATIVE mg/dL
Nitrite: NEGATIVE
PROTEIN: NEGATIVE mg/dL
Specific Gravity, Urine: 1.014 (ref 1.005–1.030)
Urobilinogen, UA: 1 mg/dL (ref 0.0–1.0)
pH: 5 (ref 5.0–8.0)

## 2013-10-05 LAB — CBC WITH DIFFERENTIAL/PLATELET
BASOS PCT: 0 % (ref 0–1)
Basophils Absolute: 0 10*3/uL (ref 0.0–0.1)
EOS ABS: 0.1 10*3/uL (ref 0.0–0.7)
EOS PCT: 1 % (ref 0–5)
HCT: 22.9 % — ABNORMAL LOW (ref 36.0–46.0)
Hemoglobin: 8.1 g/dL — ABNORMAL LOW (ref 12.0–15.0)
LYMPHS ABS: 0.3 10*3/uL — AB (ref 0.7–4.0)
Lymphocytes Relative: 5 % — ABNORMAL LOW (ref 12–46)
MCH: 32.5 pg (ref 26.0–34.0)
MCHC: 35.4 g/dL (ref 30.0–36.0)
MCV: 92 fL (ref 78.0–100.0)
Monocytes Absolute: 0.7 10*3/uL (ref 0.1–1.0)
Monocytes Relative: 11 % (ref 3–12)
Neutro Abs: 5.6 10*3/uL (ref 1.7–7.7)
Neutrophils Relative %: 83 % — ABNORMAL HIGH (ref 43–77)
PLATELETS: 206 10*3/uL (ref 150–400)
RBC: 2.49 MIL/uL — AB (ref 3.87–5.11)
RDW: 12.4 % (ref 11.5–15.5)
WBC: 6.7 10*3/uL (ref 4.0–10.5)

## 2013-10-05 LAB — BASIC METABOLIC PANEL
BUN: 26 mg/dL — AB (ref 6–23)
CALCIUM: 9.4 mg/dL (ref 8.4–10.5)
CO2: 21 mEq/L (ref 19–32)
CREATININE: 1.8 mg/dL — AB (ref 0.50–1.10)
Chloride: 99 mEq/L (ref 96–112)
GFR, EST AFRICAN AMERICAN: 35 mL/min — AB (ref >90)
GFR, EST NON AFRICAN AMERICAN: 30 mL/min — AB (ref >90)
Glucose, Bld: 154 mg/dL — ABNORMAL HIGH (ref 70–99)
Potassium: 4.6 mEq/L (ref 3.7–5.3)
Sodium: 135 mEq/L — ABNORMAL LOW (ref 137–147)

## 2013-10-05 LAB — URINE MICROSCOPIC-ADD ON

## 2013-10-05 LAB — GLUCOSE, CAPILLARY: Glucose-Capillary: 123 mg/dL — ABNORMAL HIGH (ref 70–99)

## 2013-10-05 LAB — I-STAT CG4 LACTIC ACID, ED: Lactic Acid, Venous: 0.96 mmol/L (ref 0.5–2.2)

## 2013-10-05 MED ORDER — DEXTROSE 5 % IV SOLN
2.0000 g | Freq: Once | INTRAVENOUS | Status: AC
  Administered 2013-10-05: 2 g via INTRAVENOUS
  Filled 2013-10-05: qty 2

## 2013-10-05 MED ORDER — SODIUM CHLORIDE 0.9 % IV SOLN
INTRAVENOUS | Status: DC
  Administered 2013-10-05 – 2013-10-06 (×2): via INTRAVENOUS
  Administered 2013-10-06: 125 mL/h via INTRAVENOUS

## 2013-10-05 MED ORDER — SODIUM CHLORIDE 0.9 % IV BOLUS (SEPSIS)
1000.0000 mL | Freq: Once | INTRAVENOUS | Status: AC
  Administered 2013-10-05: 1000 mL via INTRAVENOUS

## 2013-10-05 MED ORDER — ONDANSETRON HCL 4 MG PO TABS: 4.0000 mg | ORAL_TABLET | Freq: Four times a day (QID) | ORAL | Status: DC | PRN

## 2013-10-05 MED ORDER — INSULIN ASPART 100 UNIT/ML ~~LOC~~ SOLN
0.0000 [IU] | Freq: Three times a day (TID) | SUBCUTANEOUS | Status: DC
  Administered 2013-10-06: 5 [IU] via SUBCUTANEOUS
  Administered 2013-10-07: 1 [IU] via SUBCUTANEOUS
  Administered 2013-10-07: 2 [IU] via SUBCUTANEOUS

## 2013-10-05 MED ORDER — ONDANSETRON HCL 4 MG/2ML IJ SOLN
4.0000 mg | Freq: Four times a day (QID) | INTRAMUSCULAR | Status: DC | PRN
Start: 2013-10-05 — End: 2013-10-07

## 2013-10-05 MED ORDER — SODIUM CHLORIDE 0.9 % IV SOLN: INTRAVENOUS | Status: DC

## 2013-10-05 MED ORDER — ONDANSETRON HCL 4 MG/2ML IJ SOLN: 4.0000 mg | Freq: Three times a day (TID) | INTRAMUSCULAR | Status: DC | PRN

## 2013-10-05 MED ORDER — ENOXAPARIN SODIUM 40 MG/0.4ML ~~LOC~~ SOLN
40.0000 mg | SUBCUTANEOUS | Status: DC
  Administered 2013-10-05 – 2013-10-06 (×2): 40 mg via SUBCUTANEOUS
  Filled 2013-10-05 (×3): qty 0.4

## 2013-10-05 NOTE — ED Notes (Signed)
Initial Contact - pt to RM1 with family, reports was called and told to come to ER "because I have an infection".  Pt reports was seen here in ED x2 days ago and sent home after work-up.  Pt reports no real change/improvement in symptoms over the last 2 days.  Pt c/o nausea, intermittent vomiting, generalized weakness, feeling fatigued.  Pt reports has been feeling like this "for weeks now" after completion of radiation.  Pt denies CP/SOB, speaking full/clear sentences, rr even/un-lab.  MAEI, generalized weakness noted, +csm/+pulses.  Skin pale/w/d.  A+OX4.  SBP low, pt reports "it's always like that".  Changed to hospital gown, placed to monitor, NAD.

## 2013-10-05 NOTE — ED Notes (Signed)
Pt was seen here on 10/03/13 and sent home.  Pt got a call around midnight from Patty saying that she needs to come in to ED for infection and to be admitted.

## 2013-10-05 NOTE — H&P (Signed)
[facility] History and Physical  [name] ALP:[id number] DOB: 05-Jul-1955 DOA: 10/05/2013  Referring physician: edp PCP: MUSE,ROCHELLE D., PA-C   Chief Complaint: was asked by case manager to come to hospital.   HPI: [name] is a 58 y.o. female with h/o small cell lung cancer , last chemo and radiation in feb and march respectively, comes in to ED, after being told that she has an infection in the blood by one of the case managers. On arrival to ED, she was found to be hypotensive with blood pressures in 80/50's. She reports mild dizziness, no palpitations. She denies any syncopal episodes. She reports occasional nausea and vomiting because of esophageal ulcers from radiation treatments. She denies fever or chills, no burning micturition, . She has occasional diarrhea. She came to ED on 14th of June, was hypotensive at that time, was given fluds and sent home. Her  Blood cultures were checked and she was found to have one bottle of blood culture showing gram postive rods. She is then referred to medical service for evaluation of hypotension .   Review of Systems:  See hpi otherwise negative.   Past Medical History  Diagnosis Date  . Tobacco abuse   . Diabetes mellitus   . Mitral regurgitation   . Hypertension   . Dyslipidemia   . Coronary artery disease   . History of echocardiogram 7/10    EF 50-55%; inferior hypokinesis; RV function mild decreased  . ACE-inhibitor cough   . Multiple allergies     Penicillin and mushrooms  . Cancer     limited stage small cell lung  . Ulcer     ulcer on stomach, throat, and trachea per pt on 10/05/13   Past Surgical History  Procedure Laterality Date  . Video bronchoscopy Bilateral 01/26/2013    Procedure: VIDEO BRONCHOSCOPY WITHOUT FLUORO;  Surgeon: Tanda Rockers, MD;  Location: [facility];  Service: Cardiopulmonary;  Laterality: Bilateral;  . Eye surgery    . Bilateral oophorectomy  2012    benign tumors  .  Esophagogastroduodenoscopy N/A 09/07/2013    Procedure: ESOPHAGOGASTRODUODENOSCOPY (EGD);  Surgeon: Danie Binder, MD;  Location: [facility];  Service: Endoscopy;  Laterality: N/A;  9:30  . Maloney dilation N/A 09/07/2013    Procedure: Venia Minks DILATION;  Surgeon: Danie Binder, MD;  Location: [facility];  Service: Endoscopy;  Laterality: N/A;  . Savory dilation N/A 09/07/2013    Procedure: SAVORY DILATION;  Surgeon: Danie Binder, MD;  Location: [facility];  Service: Endoscopy;  Laterality: N/A;   Social History:  reports that she quit smoking about 8 months ago. Her smoking use included Cigarettes. She has a 60 pack-year smoking history. She has never used smokeless tobacco. She reports that she does not drink alcohol or use illicit drugs.  Allergies  Allergen Reactions  . Codeine Nausea And Vomiting  . Penicillins Other (See Comments)    Childhood allergy    Family History  Problem Relation Age of Onset  . Stroke Mother   . Coronary artery disease Father      Prior to Admission medications   Medication Sig Start Date End Date Taking? Authorizing Provider  B Complex-C (SUPER B COMPLEX PO) Take 1 tablet by mouth daily.   Yes Historical Provider, MD  isosorbide mononitrate (IMDUR) 30 MG 24 hr tablet Take 30 mg by mouth every morning.    Yes Historical Provider, MD  lovastatin (MEVACOR) 20 MG tablet Take 20 mg by mouth  at bedtime.   Yes Historical Provider, MD  metFORMIN (GLUCOPHAGE) 850 MG tablet Take 850 mg by mouth 2 (two) times daily with a meal.   Yes Historical Provider, MD  ondansetron (ZOFRAN ODT) 8 MG disintegrating tablet Take 1 tablet (8 mg total) by mouth every 8 (eight) hours as needed for nausea or vomiting. 10/03/13  Yes Hoy Morn, MD  vitamin B-12 (CYANOCOBALAMIN) 1000 MCG tablet Take 1,000 mcg by mouth daily.   Yes Historical Provider, MD   Physical Exam: Filed Vitals:   10/05/13 1726  BP: 111/71  Pulse: 87  Temp: 98.5 F (36.9 C)  Resp: 18    BP  111/71  Pulse 87  Temp(Src) 98.5 F (36.9 C) (Oral)  Resp 18  Ht 5\' 6"  (1.676 m)  Wt 64.774 kg (142 lb 12.8 oz)  BMI 23.06 kg/m2  SpO2 96%  General:  Appears calm and comfortable Eyes: PERRL, normal lids, irises & pale conjunctiva Neck: no LAD, masses or thyromegaly Cardiovascular: RRR, no m/r/g. No LE edema. Respiratory: CTA bilaterally, no w/r/r. Normal respiratory effort. Abdomen: soft, ntnd Skin: no rash or induration seen on limited exam Musculoskeletal: grossly normal tone BUE/BLE Neurologic: grossly non-focal.          Labs on Admission:  Basic Metabolic Panel:  Recent Labs Lab 10/03/13 1129 10/05/13 1430  NA 135* 135*  K 4.8 4.6  CL 99 99  CO2 21 21  GLUCOSE 176* 154*  BUN 32* 26*  CREATININE 1.94* 1.80*  CALCIUM 9.9 9.4   Liver Function Tests:  Recent Labs Lab 10/03/13 1129  AST 20  ALT 16  ALKPHOS 58  BILITOT 0.4  PROT 7.9  ALBUMIN 3.7    Recent Labs Lab 10/03/13 1129  LIPASE 22   No results found for this basename: AMMONIA,  in the last 168 hours CBC:  Recent Labs Lab 10/03/13 1129 10/05/13 1430  WBC 7.0 6.7  NEUTROABS 6.0 5.6  HGB 9.3* 8.1*  HCT 27.2* 22.9*  MCV 95.1 92.0  PLT 218 206   Cardiac Enzymes: No results found for this basename: CKTOTAL, CKMB, CKMBINDEX, TROPONINI,  in the last 168 hours  BNP (last 3 results) No results found for this basename: PROBNP,  in the last 8760 hours CBG:  Recent Labs Lab 10/03/13 1201  GLUCAP 138*    Radiological Exams on Admission: No results found.    Assessment/Plan Active Problems:   Dehydration   Hypotension   One bottle positive for gram positive rods on one blood culture: - probably a contaminant, will not continue or start antibiotics.  - admit to med surg for observation overnight.  - monitor.   Hypotension, : - associated with dizziness. Pt was on imdur at home.  - we stopped imdur, started her on IV fluids.  - her BP normalized after 1 lit of fluid.    Small cell lung cancer: - last chemo in February, and radiation in march. Last appt with Dr Inda Merlin one week ago.  Further management as per oncology outpatient.   Dehdyration: Gentle hydration.   Diabetes Mellitus: Holding metformin.  SSI.   Anemia: Appears to be at baseline.   CKD stage 3. :  Appears at baseline.   DVT prophylaxis.   Code Status: full code  Family Communication: family at bedside.  Disposition Plan: admit to med surg.  Time spent: 70 minutes.   [facility] Pager [phone]  **Disclaimer: This note may have been dictated with voice recognition software. Similar sounding words  can inadvertently be transcribed and this note may contain transcription errors which may not have been corrected upon publication of note.**

## 2013-10-05 NOTE — ED Provider Notes (Signed)
CSN: [id number]     Arrival date & time 10/05/13  1311 History   First MD Initiated Contact with Patient 10/05/13 1413     Chief Complaint  Patient presents with  . possible Infection     (Consider location/radiation/quality/duration/timing/severity/associated sxs/prior Treatment) HPI Comments: Patient with h/o small cell lung cancer (last chemo 2/15, last radiation 3/15). Patient has a history of weakness and lightheadness worse over the past 7 days. She's had decreased oral intake. She's had nausea and vomiting (twice today). She reports no diarrhea, melena or hematochezia. No hematemesis. She does have a history of esophageal and gastric ulcer secondary to radiation therapy. She states she feels weak and somewhat dehydrated. No urinary complaints. No fevers or chills. Patient was seen in ED two days ago, given hydration, and felt somewhat better. She was called with positive blood culture x 1 today and told to return to ED if not feeling better. She has not felt much better since being here two days ago. The onset of this condition was acute. The course is constant. Aggravating factors: none. Alleviating factors: none.    The history is provided by the patient and medical records.    Past Medical History  Diagnosis Date  . Tobacco abuse   . Diabetes mellitus   . Mitral regurgitation   . Hypertension   . Dyslipidemia   . Coronary artery disease   . History of echocardiogram 7/10    EF 50-55%; inferior hypokinesis; RV function mild decreased  . ACE-inhibitor cough   . Multiple allergies     Penicillin and mushrooms  . Cancer     limited stage small cell lung   Past Surgical History  Procedure Laterality Date  . Video bronchoscopy Bilateral 01/26/2013    Procedure: VIDEO BRONCHOSCOPY WITHOUT FLUORO;  Surgeon: Tanda Rockers, MD;  Location: [facility];  Service: Cardiopulmonary;  Laterality: Bilateral;  . Eye surgery    . Bilateral oophorectomy  2012    benign tumors  .  Esophagogastroduodenoscopy N/A 09/07/2013    Procedure: ESOPHAGOGASTRODUODENOSCOPY (EGD);  Surgeon: Danie Binder, MD;  [facility];  Service: Endoscopy;  Laterality: N/A;  9:30  . Maloney dilation N/A 09/07/2013    Procedure: Venia Minks DILATION;  Surgeon: Danie Binder, MD;  [facility];  Service: Endoscopy;  Laterality: N/A;  . Savory dilation N/A 09/07/2013    Procedure: SAVORY DILATION;  Surgeon: Danie Binder, MD;  [facility];  Service: Endoscopy;  Laterality: N/A;   Family History  Problem Relation Age of Onset  . Stroke Mother   . Coronary artery disease Father    History  Substance Use Topics  . Smoking status: Former Smoker -- 1.50 packs/day for 40 years    Types: Cigarettes    Quit date: 01/30/2013  . Smokeless tobacco: Never Used  . Alcohol Use: No   OB History   Grav Para Term Preterm Abortions TAB SAB Ect Mult Living                 Review of Systems  Constitutional: Positive for fatigue. Negative for fever.  HENT: Negative for rhinorrhea and sore throat.   Eyes: Negative for redness.  Respiratory: Negative for cough.   Cardiovascular: Negative for chest pain.  Gastrointestinal: Positive for nausea and vomiting. Negative for abdominal pain and diarrhea.  Genitourinary: Negative for dysuria.  Musculoskeletal: Negative for myalgias.  Skin: Positive for pallor. Negative for rash.  Neurological: Positive for light-headedness. Negative for headaches.  Allergies  Codeine and Penicillins  Home Medications   Prior to Admission medications   Medication Sig Start Date End Date Taking? Authorizing Provider  B Complex-C (SUPER B COMPLEX PO) Take 1 tablet by mouth daily.   Yes Historical Provider, MD  isosorbide mononitrate (IMDUR) 30 MG 24 hr tablet Take 30 mg by mouth every morning.    Yes Historical Provider, MD  lovastatin (MEVACOR) 20 MG tablet Take 20 mg by mouth at bedtime.   Yes Historical Provider, MD  metFORMIN  (GLUCOPHAGE) 850 MG tablet Take 850 mg by mouth 2 (two) times daily with a meal.   Yes Historical Provider, MD  ondansetron (ZOFRAN ODT) 8 MG disintegrating tablet Take 1 tablet (8 mg total) by mouth every 8 (eight) hours as needed for nausea or vomiting. 10/03/13  Yes Hoy Morn, MD  vitamin B-12 (CYANOCOBALAMIN) 1000 MCG tablet Take 1,000 mcg by mouth daily.   Yes Historical Provider, MD   BP 85/48  Pulse 103  Temp(Src) 99.2 F (37.3 C) (Oral)  Resp 17  SpO2 96%  Physical Exam  Nursing note and vitals reviewed. Constitutional: She appears well-developed and well-nourished.  HENT:  Head: Normocephalic and atraumatic.  Right Ear: External ear normal.  Left Ear: External ear normal.  Mouth/Throat: Oropharynx is clear and moist.  Eyes: Conjunctivae are normal. Right eye exhibits no discharge. Left eye exhibits no discharge.  Neck: Normal range of motion. Neck supple.  Cardiovascular: Normal rate, regular rhythm and normal heart sounds.   Mild tachycardia  Pulmonary/Chest: Effort normal and breath sounds normal. No respiratory distress. She has no rales.  Abdominal: Soft. There is no tenderness. There is no rebound and no guarding.  Neurological: She is alert.  Skin: Skin is warm and dry. There is pallor.  Psychiatric: She has a normal mood and affect.    ED Course  Procedures (including critical care time) Labs Review Labs Reviewed  CBC WITH DIFFERENTIAL - Abnormal; Notable for the following:    RBC 2.49 (*)    Hemoglobin 8.1 (*)    HCT 22.9 (*)    Neutrophils Relative % 83 (*)    Lymphocytes Relative 5 (*)    Lymphs Abs 0.3 (*)    All other components within normal limits  BASIC METABOLIC PANEL - Abnormal; Notable for the following:    Sodium 135 (*)    Glucose, Bld 154 (*)    BUN 26 (*)    Creatinine, Ser 1.80 (*)    GFR calc non Af Amer 30 (*)    GFR calc Af Amer 35 (*)    All other components within normal limits  URINALYSIS, ROUTINE W REFLEX MICROSCOPIC   I-STAT CG4 LACTIC ACID, ED    Imaging Review No results found.   EKG Interpretation None      Patient seen and examined. Work-up initiated. Medications ordered. D/w Dr. Maryan Rued.   Vital signs reviewed and are as follows: Filed Vitals:   10/05/13 1322  BP: 85/48  Pulse: 103  Temp: 99.2 F (37.3 C)  Resp: 17   3:58 PM Spoke with [facility]. They will see and admit to observation for hydration.   BP improved after 1000cc NS.    MDM   Final diagnoses:  Dehydration  Positive blood culture   Patient not doing well, she is dehydrated, positive blood culture x 1 (probably contamination). Obvs admit for dehydration.     Carlisle Cater, PA-C 10/05/13 [phone]

## 2013-10-05 NOTE — ED Notes (Signed)
Pt aware of need for urine specimen, sts unable to provide at this time.

## 2013-10-05 NOTE — Telephone Encounter (Signed)
Pt returned call she said she is not feeling any better.  Informed her choice to follow up w/PCP or return for reevaluation  Pt states she will return this afternoon.

## 2013-10-05 NOTE — Telephone Encounter (Signed)
Call from [facility] w/ Prelim Bld Cx result 1 of 4 bottles (aerobic bottle) gram (+) rods Chart reviewed by H. Muthersbaugh PA " Likely contam.  Please call pt.  If she is well without fever she should F/U w/PCP.  If she feels unwell in any way she should return to the ED promply.  Called (H) number and told she was at her mothers home to call (M) #.  Called (M) # and left VM requesting callback

## 2013-10-06 DIAGNOSIS — R6520 Severe sepsis without septic shock: Secondary | ICD-10-CM

## 2013-10-06 DIAGNOSIS — E440 Moderate protein-calorie malnutrition: Secondary | ICD-10-CM | POA: Insufficient documentation

## 2013-10-06 DIAGNOSIS — D638 Anemia in other chronic diseases classified elsewhere: Secondary | ICD-10-CM

## 2013-10-06 DIAGNOSIS — N183 Chronic kidney disease, stage 3 unspecified: Secondary | ICD-10-CM

## 2013-10-06 DIAGNOSIS — R7881 Bacteremia: Secondary | ICD-10-CM

## 2013-10-06 DIAGNOSIS — A419 Sepsis, unspecified organism: Secondary | ICD-10-CM

## 2013-10-06 LAB — GLUCOSE, CAPILLARY
GLUCOSE-CAPILLARY: 251 mg/dL — AB (ref 70–99)
Glucose-Capillary: 117 mg/dL — ABNORMAL HIGH (ref 70–99)
Glucose-Capillary: 80 mg/dL (ref 70–99)

## 2013-10-06 MED ORDER — BOOST PLUS PO LIQD
237.0000 mL | Freq: Three times a day (TID) | ORAL | Status: DC
  Administered 2013-10-06 – 2013-10-07 (×3): 237 mL via ORAL
  Filled 2013-10-06 (×4): qty 237

## 2013-10-06 MED ORDER — VANCOMYCIN HCL IN DEXTROSE 1-5 GM/200ML-% IV SOLN
1000.0000 mg | INTRAVENOUS | Status: DC
  Administered 2013-10-06 – 2013-10-07 (×2): 1000 mg via INTRAVENOUS
  Filled 2013-10-06 (×2): qty 200

## 2013-10-06 NOTE — Progress Notes (Signed)
[facility] PROGRESS NOTE  [name] IRW:[id number] DOB: 05-18-1955 DOA: 10/05/2013 PCP: MUSE,ROCHELLE D., PA-C  Assessment/Plan: Severe Sepsis -GPC in blood and hypotension. -Sepsis parameters improved today.  Bacteremia -1/2 cultures with GPC in pairs and chains. -Have started vancomycin pending speciation.  Hypotension -Likely related to bacteremia. -IMDUR has been discontinued.  CKD Stage III-IV -At baseline.  Anemia -Hb stable at 8-9. -Likely of chronic disease related to her lung cancer.  Lung Cancer -Continue OP follow up with Dr. Julien Nordmann.  Code Status: Full Code Family Communication: Patient only  Disposition Plan: Home when ready with [facility] services   Consultants:  None   Antibiotics:  Vancomycin   Subjective: No complaints  Objective: Filed Vitals:   10/05/13 2200 10/06/13 0643 10/06/13 0925 10/06/13 1358  BP: 115/58 115/70 88/51 124/68  Pulse: 96 95 107 68  Temp: 100.1 F (37.8 C) 99.2 F (37.3 C) 98.9 F (37.2 C) 99.1 F (37.3 C)  TempSrc: Oral Oral Oral Oral  Resp: 20 18 18 18   Height:      Weight:      SpO2: 91% 94% 91% 97%    Intake/Output Summary (Last 24 hours) at 10/06/13 1626 Last data filed at 10/06/13 1300  Gross per 24 hour  Intake    440 ml  Output      0 ml  Net    440 ml   Filed Weights   10/05/13 1726  Weight: 64.774 kg (142 lb 12.8 oz)    Exam:   General:  AA Ox3  Cardiovascular: RRR  Respiratory: CTA B  Abdomen: S/NT/ND/+BS  Extremities: no C/C/E   Neurologic:  Non-focal  Data Reviewed: Basic Metabolic Panel:  Recent Labs Lab 10/03/13 1129 10/05/13 1430  NA 135* 135*  K 4.8 4.6  CL 99 99  CO2 21 21  GLUCOSE 176* 154*  BUN 32* 26*  CREATININE 1.94* 1.80*  CALCIUM 9.9 9.4   Liver Function Tests:  Recent Labs Lab 10/03/13 1129  AST 20  ALT 16  ALKPHOS 58  BILITOT 0.4  PROT 7.9  ALBUMIN 3.7    Recent Labs Lab 10/03/13 1129  LIPASE 22   No results found for  this basename: AMMONIA,  in the last 168 hours CBC:  Recent Labs Lab 10/03/13 1129 10/05/13 1430  WBC 7.0 6.7  NEUTROABS 6.0 5.6  HGB 9.3* 8.1*  HCT 27.2* 22.9*  MCV 95.1 92.0  PLT 218 206   Cardiac Enzymes: No results found for this basename: CKTOTAL, CKMB, CKMBINDEX, TROPONINI,  in the last 168 hours BNP (last 3 results) No results found for this basename: PROBNP,  in the last 8760 hours CBG:  Recent Labs Lab 10/03/13 1201 10/05/13 2324 10/06/13 0715 10/06/13 1149  GLUCAP 138* 123* 117* 251*    Recent Results (from the past 240 hour(s))  CULTURE, BLOOD (ROUTINE X 2)     Status: None   Collection Time    10/03/13 11:31 AM      Result Value Ref Range Status   Specimen Description BLOOD RIGHT ANTECUBITAL   Final   Special Requests BOTTLES DRAWN AEROBIC AND ANAEROBIC 5 CC EACH   Final   Culture  Setup Time     Final   Value: 10/03/2013 18:30     Performed at [facility]   Culture     Final   Value:        BLOOD CULTURE RECEIVED NO GROWTH TO DATE CULTURE WILL BE HELD FOR 5 DAYS BEFORE  ISSUING A FINAL NEGATIVE REPORT     Performed at [facility]   Report Status PENDING   Incomplete  CULTURE, BLOOD (ROUTINE X 2)     Status: None   Collection Time    10/03/13 11:59 AM      Result Value Ref Range Status   Specimen Description BLOOD LEFT ARM   Final   Special Requests BOTTLES DRAWN AEROBIC AND ANAEROBIC [facility] EACH   Final   Culture  Setup Time     Final   Value: 10/03/2013 18:30     Performed at [facility]   Culture     Final   Value: GRAM POSITIVE RODS     GRAM POSITIVE COCCI IN PAIRS AND CHAINS     Note: Gram Stain Report Called to,Read Back By and Verified With: P CLARK AT 2057 10/04/13 BY SNOLO Gram Stain Report Called to,Read Back By and Verified With: ASHTON MERRITT 10/06/13 0620A [place]     Performed at [facility]   Report Status PENDING   Incomplete  URINE CULTURE     Status: None   Collection Time    10/03/13  1:32 PM       Result Value Ref Range Status   Specimen Description URINE, CLEAN CATCH   Final   Special Requests NONE   Final   Culture  Setup Time     Final   Value: 10/03/2013 22:00     Performed at [place]     Final   Value: 4,000 COLONIES/ML     Performed at [facility]   Culture     Final   Value: INSIGNIFICANT GROWTH     Performed at [facility]   Report Status 10/04/2013 FINAL   Final     Studies: No results found.  Scheduled Meds: . enoxaparin (LOVENOX) injection  40 mg Subcutaneous Q24H  . insulin aspart  0-9 Units Subcutaneous TID WC  . lactose free nutrition  237 mL Oral TID WC  . vancomycin  1,000 mg Intravenous Q24H   Continuous Infusions: . sodium chloride 125 mL/hr (10/06/13 1127)    Principal Problem:   Severe sepsis Active Problems:   Hypotension   Bacteremia   DM   HYPERTENSION   Small cell lung carcinoma   Dehydration   Malnutrition of moderate degree   CKD (chronic kidney disease) stage 3, GFR 30-59 ml/min   Anemia of chronic disease    Time spent: 35 minutes. Greater than 50% of this time was spent in direct contact with the patient coordinating care.    Lelon Frohlich  [facility] Pager [phone]  If 7PM-7AM, please contact night-coverage at [email or web address], password [facility] 10/06/2013, 4:26 PM  LOS: 1 day

## 2013-10-06 NOTE — Progress Notes (Signed)
UR Completed Brenda Graves-Bigelow, RN,BSN [phone]  

## 2013-10-06 NOTE — Progress Notes (Signed)
ANTIBIOTIC CONSULT NOTE - INITIAL  Pharmacy Consult for Vancomycin Indication: Bacteremia  Allergies  Allergen Reactions  . Codeine Nausea And Vomiting  . Penicillins Other (See Comments)    Childhood allergy    Patient Measurements: Height: 5\' 6"  (167.6 cm) Weight: 142 lb 12.8 oz (64.774 kg) IBW/kg (Calculated) : 59.3   Vital Signs: Temp: 98.9 F (37.2 C) (06/17 0925) Temp src: Oral (06/17 0925) BP: 88/51 mmHg (06/17 0925) Pulse Rate: 107 (06/17 0925) Intake/Output from previous day:   Intake/Output from this shift:    Labs:  Recent Labs  10/03/13 1129 10/05/13 1430  WBC 7.0 6.7  HGB 9.3* 8.1*  PLT 218 206  CREATININE 1.94* 1.80*   Estimated Creatinine Clearance: 31.9 ml/min (by C-G formula based on Cr of 1.8). No results found for this basename: VANCOTROUGH, VANCOPEAK, VANCORANDOM, GENTTROUGH, GENTPEAK, GENTRANDOM, TOBRATROUGH, TOBRAPEAK, TOBRARND, AMIKACINPEAK, AMIKACINTROU, AMIKACIN,  in the last 72 hours   Microbiology: Recent Results (from the past 720 hour(s))  KOH PREP     Status: None   Collection Time    09/07/13 10:28 AM      Result Value Ref Range Status   Specimen Description ESOPHAGUS BRUSHING   Final   Special Requests NONE   Final   KOH Prep     Final   Value: NO YEAST OR FUNGAL ELEMENTS SEEN     Performed at [facility]   Report Status 09/07/2013 FINAL   Final  CULTURE, BLOOD (ROUTINE X 2)     Status: None   Collection Time    10/03/13 11:31 AM      Result Value Ref Range Status   Specimen Description BLOOD RIGHT ANTECUBITAL   Final   Special Requests BOTTLES DRAWN AEROBIC AND ANAEROBIC 5 CC EACH   Final   Culture  Setup Time     Final   Value: 10/03/2013 18:30     Performed at [facility]   Culture     Final   Value:        BLOOD CULTURE RECEIVED NO GROWTH TO DATE CULTURE WILL BE HELD FOR 5 DAYS BEFORE ISSUING A FINAL NEGATIVE REPORT     Performed at [facility]   Report Status PENDING   Incomplete   CULTURE, BLOOD (ROUTINE X 2)     Status: None   Collection Time    10/03/13 11:59 AM      Result Value Ref Range Status   Specimen Description BLOOD LEFT ARM   Final   Special Requests BOTTLES DRAWN AEROBIC AND ANAEROBIC [facility] EACH   Final   Culture  Setup Time     Final   Value: 10/03/2013 18:30     Performed at [facility]   Culture     Final   Value: GRAM POSITIVE RODS     GRAM POSITIVE COCCI IN PAIRS AND CHAINS     Note: Gram Stain Report Called to,Read Back By and Verified With: P CLARK AT 2057 10/04/13 BY SNOLO Gram Stain Report Called to,Read Back By and Verified With: ASHTON MERRITT 10/06/13 0620A [place]     Performed at [facility]   Report Status PENDING   Incomplete  URINE CULTURE     Status: None   Collection Time    10/03/13  1:32 PM      Result Value Ref Range Status   Specimen Description URINE, CLEAN CATCH   Final   Special Requests NONE   Final   Culture  Setup Time     Final   Value: 10/03/2013 22:00     Performed at [place]     Final   Value: 4,000 COLONIES/ML     Performed at [facility]   Culture     Final   Value: INSIGNIFICANT GROWTH     Performed at [facility]   Report Status 10/04/2013 FINAL   Final    Medical History: Past Medical History  Diagnosis Date  . Tobacco abuse   . Diabetes mellitus   . Mitral regurgitation   . Hypertension   . Dyslipidemia   . Coronary artery disease   . History of echocardiogram 7/10    EF 50-55%; inferior hypokinesis; RV function mild decreased  . ACE-inhibitor cough   . Multiple allergies     Penicillin and mushrooms  . Cancer     limited stage small cell lung  . Ulcer     ulcer on stomach, throat, and trachea per pt on 10/05/13    Medications:  Scheduled:  . enoxaparin (LOVENOX) injection  40 mg Subcutaneous Q24H  . insulin aspart  0-9 Units Subcutaneous TID WC  . vancomycin  1,000 mg Intravenous Q24H   Infusions:  . sodium chloride 125  mL/hr at 10/05/13 1558   PRN: ondansetron (ZOFRAN) IV, ondansetron Assessment:  58 y.o. female with h/o small cell lung cancer , last chemo and radiation in feb and march respectively. On arrival she was found to be hypotensive with BP in 80/50's. She came to the ED on 6/14 was given fluid and sent home. Her Blood cultures were checked and she was found to have one bottle of blood culture showing gram postive rods.   6/17 >>Vanc >>  Tmax:100.1 WBCs:6.7 Renal:Scr=1.8, CrCl=31.34ml/min   Goal of Therapy:   Vancomycin per renal dosing guidelines  Eradication of infection  Plan:   Vancomycin 1gm IV q24h  Follow renal function   Levels as needed   Dolly Rias RPh 10/06/2013, 9:59 AM Pager [phone]

## 2013-10-06 NOTE — Evaluation (Signed)
Physical Therapy Evaluation Patient Details Name: [name] MRN: [id number] DOB: Sep 18, 1955 Today's Date: 10/06/2013   History of Present Illness  pt 58 yo with h/o small cell lung cancer. Returns to hospital due to being in ED 10/04/2013 with c/o hypotensive and now treating her for gram postive cultures. She reports "being off when she walks" since her last chemo and brain radiation back in April. She stated she noticed right after her last treatment of radiation that she was not balance when she walks. She has resulted in holding to her husband and or furniture, walls, etc.   Clinical Impression  Pt presents with difficulty walking, being "off balance" while walking, and unable to walk straight. Feel she would benefit from continued PT to continue to assess limitations from a vestibular and /or neuro standpoint and being some accomodation exercises to improve with balance and safety with gait.     Follow Up Recommendations Home health PT (please emphasize a vestibular /neuro trained PT for HHPT, Thanks)    Equipment Recommendations  Rolling walker with 5" wheels (I recommend a RW , but she does not agree. )    Recommendations for Other Services       Precautions / Restrictions Precautions Precaution Comments: pt states she has not fallen       Mobility  Bed Mobility Overal bed mobility: Modified Independent                Transfers Overall transfer level: Modified independent Equipment used: 1 person hand held assist                Ambulation/Gait Ambulation/Gait assistance: Mod assist Ambulation Distance (Feet): 40 Feet (second time 50 feet with RW , MinA) Assistive device: 1 person hand held assist Gait Pattern/deviations: Ataxic;Step-through pattern;Wide base of support     General Gait Details: Pt with a wide base , unpredictable gait pattern with off balance and reaching for rail and needing assitance to steady. She states she holds on to her husband  a lot. Even with focal point , giat unsteady, no nystagmus noted with simple testing. Pt states" I just feel lie I weave and get off balance any time I am on my feet , and at times in sitting as well. Pt reported she would prefer NOT to use a RW however I made her try a walk with one and she was able to steady herself much better requiring only minA instead of ModA. I really emphasized the safety a RW would provide her and I recommended it , but know she can do what she would like.   Stairs            Wheelchair Mobility    Modified Rankin (Stroke Patients Only)       Balance                                             Pertinent Vitals/Pain Denies pain    Home Living Family/patient expects to be discharged to:: Private residence Living Arrangements: Spouse/significant other (her husband works, and she is the caregiver of her mother at her mothers house during the day) Available Help at Discharge: Friend(s) (friend is coming up from [facility] to help her and care for pt's mother) Type of Home: House Home Access: Stairs to enter Entrance Stairs-Rails: None (at her mothers house which she goes to  dauily there are 3 steps and a railing ) Entrance Stairs-Number of Steps: 3 (no railing at her house and she had asked her husband to put one in bc it is difficult') Home Layout: One level Home Equipment: None (but she has access to some, but would prefer not to use it )      Prior Function Level of Independence: Independent         Comments: pt claims she is indpendent and drives at times as well. (wondering the validiity of this) she also states she is the caregiver of her mother.      Hand Dominance        Extremity/Trunk Assessment               Lower Extremity Assessment: Generalized weakness (however overal fairly strong. tested for open chain ataxia seated adn none noted however with gait seemed more trunk /lower body/LE ataxia and//or  balance)         Communication   Communication: No difficulties  Cognition Arousal/Alertness: Awake/alert Behavior During Therapy: WFL for tasks assessed/performed Overall Cognitive Status: Within Functional Limits for tasks assessed                      General Comments      Exercises        Assessment/Plan    PT Assessment Patient needs continued PT services  PT Diagnosis Difficulty walking   PT Problem List Decreased activity tolerance;Decreased balance;Decreased mobility;Decreased safety awareness  PT Treatment Interventions Gait training;Functional mobility training;Therapeutic activities;Therapeutic exercise;Patient/family education;Neuromuscular re-education   PT Goals (Current goals can be found in the Care Plan section) Acute Rehab PT Goals Patient Stated Goal: I would like to get back home PT Goal Formulation: With patient Time For Goal Achievement: 10/20/13    Frequency Min 3X/week   Barriers to discharge        Co-evaluation               End of Session Equipment Utilized During Treatment: Gait belt Activity Tolerance: Patient tolerated treatment well Patient left: in chair Nurse Communication: Mobility status (pt reproted she calls for assistance to go to the bathroom, etc here in the hospital. . )    Functional Assessment Tool Used: clincial judgement Functional Limitation: Mobility: Walking and moving around Mobility: Walking and Moving Around Current Status [id number]): At least 20 percent but less than 40 percent impaired, limited or restricted Mobility: Walking and Moving Around Goal Status [phone]): At least 1 percent but less than 20 percent impaired, limited or restricted    Time: 1332-1400 PT Time Calculation (min): 28 min   Charges:   PT Evaluation $Initial PT Evaluation Tier I: 1 Procedure PT Treatments $Gait Training: 8-22 mins $Neuromuscular Re-education: 8-22 mins   PT G Codes:   Functional Assessment Tool Used:  clincial judgement Functional Limitation: Mobility: Walking and moving around    [place], [facility] 10/06/2013, 4:11 PM Clide Dales, PT Pager: [phone] 10/06/2013

## 2013-10-06 NOTE — Progress Notes (Signed)
INITIAL NUTRITION ASSESSMENT  DOCUMENTATION CODES Per approved criteria  -Non-severe (moderate) malnutrition in the context of chronic illness  Patient meets criteria for moderate MALNUTRITION related to chronic illness AEB 14% weight loss in the past 6 months and intake of <75% of needs for greater than 1 month.  INTERVENTION: -Boost tid -Continue Regular diet with patient preferences -Educated patient on need to increase calories and protein.  Questions answered.  Teach back method used. -RD to follow.  NUTRITION DIAGNOSIS: Inadequate oral intake related to pain with swallowing intermittenlty as evidenced by patient report.   Goal: Intake of meals and supplements to meet >90% estimated needs.  Monitor:  Intake, labs,weight trend, diet tolerance  Reason for Assessment: MST  58 y.o. female  Admitting Dx: <principal problem not specified>  ASSESSMENT: Patient with a history of small cell lung cancer with last chemo in February and radiation in March.  Admitted with hypotension and dehydration.    -Patient was last seen by Schneider RD 05/31/13.   -UBW- 165 lbs 03/25/13.  Weight loss of 23 lbs (14 lbs in the last 6 months.   -Complains of ulcers in throat and stomach and items tolerated varies.  (can tolerate Doritos but not bread) -50% intake of regular diet.   -Concerned about eating certain things secondary to diabetes.  Height: Ht Readings from Last 1 Encounters:  10/05/13 5\' 6"  (1.676 m)    Weight: Wt Readings from Last 1 Encounters:  10/05/13 142 lb 12.8 oz (64.774 kg)    Ideal Body Weight: 130 lbs  % Ideal Body Weight: 109  Wt Readings from Last 10 Encounters:  10/05/13 142 lb 12.8 oz (64.774 kg)  09/29/13 139 lb 1.6 oz (63.095 kg)  09/02/13 144 lb (65.318 kg)  06/29/13 150 lb (68.04 kg)  05/31/13 151 lb 9.6 oz (68.765 kg)  05/21/13 152 lb 4.8 oz (69.083 kg)  05/10/13 157 lb 12.8 oz (71.578 kg)  03/22/13 165 lb 14.4 oz (75.252 kg)  03/01/13  162 lb 3.2 oz (73.573 kg)  02/02/13 159 lb 8 oz (72.349 kg)    Usual Body Weight: 165 lbs 6 months ago  % Usual Body Weight: 86  BMI:  Body mass index is 23.06 kg/(m^2).  Estimated Nutritional Needs: Kcal: 1700-1800  Protein: 75-85 gm Fluid: >1.6L  Skin: intact  Diet Order: General  EDUCATION NEEDS: -Education needs addressed   Intake/Output Summary (Last 24 hours) at 10/06/13 1423 Last data filed at 10/06/13 1300  Gross per 24 hour  Intake    440 ml  Output      0 ml  Net    440 ml      Labs:   Recent Labs Lab 10/03/13 1129 10/05/13 1430  NA 135* 135*  K 4.8 4.6  CL 99 99  CO2 21 21  BUN 32* 26*  CREATININE 1.94* 1.80*  CALCIUM 9.9 9.4  GLUCOSE 176* 154*    CBG (last 3)   Recent Labs  10/05/13 2324 10/06/13 0715 10/06/13 1149  GLUCAP 123* 117* 251*    Scheduled Meds: . enoxaparin (LOVENOX) injection  40 mg Subcutaneous Q24H  . insulin aspart  0-9 Units Subcutaneous TID WC  . vancomycin  1,000 mg Intravenous Q24H    Continuous Infusions: . sodium chloride 125 mL/hr (10/06/13 1127)    Past Medical History  Diagnosis Date  . Tobacco abuse   . Diabetes mellitus   . Mitral regurgitation   . Hypertension   . Dyslipidemia   . Coronary  artery disease   . History of echocardiogram 7/10    EF 50-55%; inferior hypokinesis; RV function mild decreased  . ACE-inhibitor cough   . Multiple allergies     Penicillin and mushrooms  . Cancer     limited stage small cell lung  . Ulcer     ulcer on stomach, throat, and trachea per pt on 10/05/13    Past Surgical History  Procedure Laterality Date  . Video bronchoscopy Bilateral 01/26/2013    Procedure: VIDEO BRONCHOSCOPY WITHOUT FLUORO;  Surgeon: Tanda Rockers, MD;  Location: [facility];  Service: Cardiopulmonary;  Laterality: Bilateral;  . Eye surgery    . Bilateral oophorectomy  2012    benign tumors  . Esophagogastroduodenoscopy N/A 09/07/2013    Procedure: ESOPHAGOGASTRODUODENOSCOPY  (EGD);  Surgeon: Danie Binder, MD;  Location: [facility];  Service: Endoscopy;  Laterality: N/A;  9:30  . Maloney dilation N/A 09/07/2013    Procedure: Venia Minks DILATION;  Surgeon: Danie Binder, MD;  Location: [facility];  Service: Endoscopy;  Laterality: N/A;  . Savory dilation N/A 09/07/2013    Procedure: SAVORY DILATION;  Surgeon: Danie Binder, MD;  Location: [facility];  Service: Endoscopy;  Laterality: N/A;    Antonieta Iba, RD, LDN Clinical Inpatient Dietitian Pager:  [phone] Weekend and after hours pager:  [phone]

## 2013-10-07 DIAGNOSIS — R7881 Bacteremia: Secondary | ICD-10-CM

## 2013-10-07 LAB — BASIC METABOLIC PANEL
BUN: 14 mg/dL (ref 6–23)
CALCIUM: 8.4 mg/dL (ref 8.4–10.5)
CO2: 17 mEq/L — ABNORMAL LOW (ref 19–32)
Chloride: 106 mEq/L (ref 96–112)
Creatinine, Ser: 1.21 mg/dL — ABNORMAL HIGH (ref 0.50–1.10)
GFR calc Af Amer: 56 mL/min — ABNORMAL LOW (ref >90)
GFR calc non Af Amer: 48 mL/min — ABNORMAL LOW (ref >90)
GLUCOSE: 114 mg/dL — AB (ref 70–99)
Potassium: 3.8 mEq/L (ref 3.7–5.3)
Sodium: 138 mEq/L (ref 137–147)

## 2013-10-07 LAB — CBC
HCT: 21.6 % — ABNORMAL LOW (ref 36.0–46.0)
Hemoglobin: 7.9 g/dL — ABNORMAL LOW (ref 12.0–15.0)
MCH: 33.6 pg (ref 26.0–34.0)
MCHC: 36.6 g/dL — AB (ref 30.0–36.0)
MCV: 91.9 fL (ref 78.0–100.0)
PLATELETS: 163 10*3/uL (ref 150–400)
RBC: 2.35 MIL/uL — ABNORMAL LOW (ref 3.87–5.11)
RDW: 12.3 % (ref 11.5–15.5)
WBC: 4 10*3/uL (ref 4.0–10.5)

## 2013-10-07 LAB — GLUCOSE, CAPILLARY
Glucose-Capillary: 167 mg/dL — ABNORMAL HIGH (ref 70–99)
Glucose-Capillary: 125 mg/dL — ABNORMAL HIGH (ref 70–99)
Glucose-Capillary: 194 mg/dL — ABNORMAL HIGH (ref 70–99)

## 2013-10-07 LAB — CULTURE, BLOOD (ROUTINE X 2)

## 2013-10-07 NOTE — Care Management Note (Unsigned)
    Page 1 of 1   10/07/2013     3:33:36 PM CARE MANAGEMENT NOTE 10/07/2013  Patient:  DEBORA, STOCKDALE   Account Number:  [id number]  Date Initiated:  10/07/2013  Documentation initiated by:  Allene Dillon  Subjective/Objective Assessment:   58 year old female admuitted with dehydration and hypotension.     Action/Plan:   From home.   Anticipated DC Date:  10/07/2013   Anticipated DC Plan:  [place]  CM consult      Choice offered to / List presented to:             Status of service:  Completed, signed off Medicare Important Message given?   (If response is "NO", the following Medicare IM given date fields will be blank) Date Medicare IM given:   Date Additional Medicare IM given:    Discharge Disposition:  HOME/SELF CARE  Per UR Regulation:  Reviewed for med. necessity/level of care/duration of stay  If discussed at [place] of Stay Meetings, dates discussed:    Comments:  10/07/13 Allene Dillon RN BSN [phone] Consult received to see pt in regards to follow up care after hospital stay. Per MD she needs bblood cultures to be drawn next Tuesday and she does not have a PCP. I met with pt at bedside to discuss this. She stated she plans on going to the health department and see the provider she has previously seen. If that does not work out, I gave her a Programmer, systems for the [employer] and [employer] on [address] and asked her to follow up there. Pt in agreement.

## 2013-10-07 NOTE — Discharge Summary (Signed)
Physician Discharge Summary  [name] WNU:[id number] DOB: 1956-01-28 DOA: 10/05/2013  PCP: Royce Macadamia D., PA-C  Admit date: 10/05/2013 Discharge date: 10/07/2013  Time spent: 45 minutes  Recommendations for Outpatient Follow-up:  -Will be discharged home today. -CM will arrange follow up for surveillance BC to be drawn in 5 days off of antibiotics.   Discharge Diagnoses:  Principal Problem:   Severe sepsis Active Problems:   Hypotension   Bacteremia   DM   HYPERTENSION   Small cell lung carcinoma   Dehydration   Malnutrition of moderate degree   CKD (chronic kidney disease) stage 3, GFR 30-59 ml/min   Anemia of chronic disease   Discharge Condition: Stable and improved  Filed Weights   10/05/13 1726  Weight: 64.774 kg (142 lb 12.8 oz)    History of present illness:  [name] is a 58 y.o. female with h/o small cell lung cancer , last chemo and radiation in feb and march respectively, comes in to ED, after being told that she has an infection in the blood by one of the case managers. On arrival to ED, she was found to be hypotensive with blood pressures in 80/50's. She reports mild dizziness, no palpitations. She denies any syncopal episodes. She reports occasional nausea and vomiting because of esophageal ulcers from radiation treatments. She denies fever or chills, no burning micturition, . She has occasional diarrhea. She came to ED on 14th of June, was hypotensive at that time, was given fluds and sent home. Her Blood cultures were checked and she was found to have one bottle of blood culture showing gram postive rods. She is then referred to medical service for evaluation of hypotension .   Hospital Course:   Severe Sepsis  -Resolved. -Unclear if true sepsis given BC results, or simply her baseline BP being low given her body habitus. -Will DC Imdur indefinitely for now.  Bacteremia  -1/2 cultures with GPC in pairs and chains, speciated to  micrococcus and diphteroids. -Has been on vancomycin since admission. -Discussed with ID, and plan to DC without antibiotics (probably a contaminant), but will need surveillance BC in 4-5 days off of antibiotics to ensure they remain negative.  Hypotension  -IMDUR has been discontinued.   CKD Stage III-IV  -At baseline.   Anemia  -Hb stable at 8-9.  -Likely of chronic disease related to her lung cancer.   Lung Cancer  -Continue OP follow up with Dr. Julien Nordmann.   Procedures:  None   Consultations:  None  Discharge Instructions  Discharge Instructions   Discontinue IV    Complete by:  As directed      Increase activity slowly    Complete by:  As directed             Medication List    STOP taking these medications       isosorbide mononitrate 30 MG 24 hr tablet  Commonly known as:  IMDUR      TAKE these medications       lovastatin 20 MG tablet  Commonly known as:  MEVACOR  Take 20 mg by mouth at bedtime.     metFORMIN 850 MG tablet  Commonly known as:  GLUCOPHAGE  Take 850 mg by mouth 2 (two) times daily with a meal.     ondansetron 8 MG disintegrating tablet  Commonly known as:  ZOFRAN ODT  Take 1 tablet (8 mg total) by mouth every 8 (eight) hours as needed for nausea or vomiting.  SUPER B COMPLEX PO  Take 1 tablet by mouth daily.     vitamin B-12 1000 MCG tablet  Commonly known as:  CYANOCOBALAMIN  Take 1,000 mcg by mouth daily.       Allergies  Allergen Reactions  . Codeine Nausea And Vomiting  . Penicillins Other (See Comments)    Childhood allergy       Follow-up Information   Follow up with MUSE,ROCHELLE D., PA-C. Schedule an appointment as soon as possible for a visit in 2 weeks.   Contact information:   [address] [place] [address] [phone]       Please follow up. (CM will arrange appointment to have your blood cultures rechecked next week.)        The results of significant diagnostics from this  hospitalization (including imaging, microbiology, ancillary and laboratory) are listed below for reference.    Significant Diagnostic Studies: Ct Abdomen Pelvis Wo Contrast  09/28/2013   CLINICAL DATA:  Lung cancer diagnosed 9/14. Chemotherapy and radiation therapy complete. Hysterectomy. Nausea and vomiting. Small cell lung cancer.  EXAM: CT CHEST, ABDOMEN AND PELVIS WITHOUT CONTRAST  TECHNIQUE: Multidetector CT imaging of the chest, abdomen and pelvis was performed following the standard protocol without IV contrast.  COMPARISON:  Chest CT 06/28/2013. PET of 02/05/2013. Most recent abdominal pelvic CT of 01/21/2013. Most recent clinic note of 06/29/2012.  FINDINGS: CT CHEST FINDINGS  Lungs/Pleura: Moderate centrilobular emphysema. Subpleural spiculated right upper lobe lung nodule. 1.3 x 1.0 cm on image 14 versus 1.4 x 1.3 cm on the prior exam.  Increased peribronchovascular nodular/interstitial opacity. Example image 25/series 4. Similar right middle lobe micro nodularity.  Similar to slight increase in the left-sided interstitial thickening.  Partial collapse of the medial right middle lobe is similar. Compression or collapse of the subtending bronchus.  No pleural fluid.  Heart/Mediastinum: A right low jugular/supraclavicular node measures 1.2 x 1.2 cm on image 6, new since the prior exam. Measured 1.0 cm on the 02/05/2013 PET.  No axillary adenopathy. Aortic and branch vessel atherosclerosis. Mild pulmonary artery enlargement, with the outflow tract measuring 3.1 cm. Multivessel coronary artery atherosclerosis. Normal heart size, without pericardial effusion.  Extensive calcified mediastinal and right hilar lymph nodes, consistent with old granulomatous disease. A noncalcified right paratracheal node measures 8 mm on image 19 versus 1.3 cm on the prior. Hilar regions poorly evaluated without intravenous contrast. Index prevascular node measures 7 mm on image 24, decreased from 9 mm on the prior.  CT ABDOMEN  AND PELVIS FINDINGS  Abdomen/Pelvis: Scattered low-density subcentimeter liver lesions which are similar to on the prior exam and likely cysts. Decrease sensitivity for new hepatic lesions secondary to lack of IV contrast.  Normal noncontrast appearance of the spleen, stomach. Pancreatic atrophy. Normal gallbladder, biliary tract, right adrenal gland. Mild left adrenal thickening which is unchanged.  Mild bilateral renal cortical thinning. Punctate left renal collecting system stone. Age advanced aortic and branch vessel atherosclerosis. No retroperitoneal or retrocrural adenopathy. Normal colon and terminal ileum. Normal small bowel without abdominal ascites. No pelvic adenopathy. Normal urinary bladder and uterus, without adnexal mass or significant free pelvic fluid.  Bones/Musculoskeletal: Moderate osteopenia. No acute osseous abnormality.  IMPRESSION: CT CHEST IMPRESSION  1. Decreased size of right upper lobe lung nodule and thoracic lymph nodes, suggesting response to disease within the chest. 2. Low right cervical adenopathy which is new since 06/28/2013 and progressive since 02/05/2013. This is most consistent with disease progression. 3. Centrilobular emphysema  with progressive reticular nodular opacities, primarily on the right. Correlate with infectious symptoms, as acute on chronic atypical infection could have this appearance. Differential considerations include drug toxicity. 4. Age advanced coronary artery atherosclerosis. Recommend assessment of coronary risk factors and consideration of medical therapy. 5. Pulmonary artery enlargement suggests pulmonary arterial hypertension.  CT ABDOMEN AND PELVIS IMPRESSION  1. No acute process or evidence of metastatic disease in the abdomen or pelvis. 2. Age advanced atherosclerosis. 3. Left nephrolithiasis.   Electronically Signed   By: Abigail Miyamoto M.D.   On: 09/28/2013 14:30   Dg Chest 2 View  10/03/2013   CLINICAL DATA:  Weakness, nausea  EXAM: CHEST  2  VIEW  COMPARISON:  05/20/2013 and 09/28/2013  FINDINGS: Cardiomediastinal silhouette is stable. Again noted spiculated nodule in right upper lobe measures 1.3 cm. No acute infiltrate or pleural effusion. No pulmonary edema. Bony thorax is stable.  IMPRESSION: No active disease. Again noted spiculated nodule in right upper lobe measures 1.3 cm.   Electronically Signed   By: Lahoma Crocker M.D.   On: 10/03/2013 12:07   Ct Chest Wo Contrast  09/28/2013   CLINICAL DATA:  Lung cancer diagnosed 9/14. Chemotherapy and radiation therapy complete. Hysterectomy. Nausea and vomiting. Small cell lung cancer.  EXAM: CT CHEST, ABDOMEN AND PELVIS WITHOUT CONTRAST  TECHNIQUE: Multidetector CT imaging of the chest, abdomen and pelvis was performed following the standard protocol without IV contrast.  COMPARISON:  Chest CT 06/28/2013. PET of 02/05/2013. Most recent abdominal pelvic CT of 01/21/2013. Most recent clinic note of 06/29/2012.  FINDINGS: CT CHEST FINDINGS  Lungs/Pleura: Moderate centrilobular emphysema. Subpleural spiculated right upper lobe lung nodule. 1.3 x 1.0 cm on image 14 versus 1.4 x 1.3 cm on the prior exam.  Increased peribronchovascular nodular/interstitial opacity. Example image 25/series 4. Similar right middle lobe micro nodularity.  Similar to slight increase in the left-sided interstitial thickening.  Partial collapse of the medial right middle lobe is similar. Compression or collapse of the subtending bronchus.  No pleural fluid.  Heart/Mediastinum: A right low jugular/supraclavicular node measures 1.2 x 1.2 cm on image 6, new since the prior exam. Measured 1.0 cm on the 02/05/2013 PET.  No axillary adenopathy. Aortic and branch vessel atherosclerosis. Mild pulmonary artery enlargement, with the outflow tract measuring 3.1 cm. Multivessel coronary artery atherosclerosis. Normal heart size, without pericardial effusion.  Extensive calcified mediastinal and right hilar lymph nodes, consistent with old  granulomatous disease. A noncalcified right paratracheal node measures 8 mm on image 19 versus 1.3 cm on the prior. Hilar regions poorly evaluated without intravenous contrast. Index prevascular node measures 7 mm on image 24, decreased from 9 mm on the prior.  CT ABDOMEN AND PELVIS FINDINGS  Abdomen/Pelvis: Scattered low-density subcentimeter liver lesions which are similar to on the prior exam and likely cysts. Decrease sensitivity for new hepatic lesions secondary to lack of IV contrast.  Normal noncontrast appearance of the spleen, stomach. Pancreatic atrophy. Normal gallbladder, biliary tract, right adrenal gland. Mild left adrenal thickening which is unchanged.  Mild bilateral renal cortical thinning. Punctate left renal collecting system stone. Age advanced aortic and branch vessel atherosclerosis. No retroperitoneal or retrocrural adenopathy. Normal colon and terminal ileum. Normal small bowel without abdominal ascites. No pelvic adenopathy. Normal urinary bladder and uterus, without adnexal mass or significant free pelvic fluid.  Bones/Musculoskeletal: Moderate osteopenia. No acute osseous abnormality.  IMPRESSION: CT CHEST IMPRESSION  1. Decreased size of right upper lobe lung nodule and thoracic lymph nodes, suggesting response  to disease within the chest. 2. Low right cervical adenopathy which is new since 06/28/2013 and progressive since 02/05/2013. This is most consistent with disease progression. 3. Centrilobular emphysema with progressive reticular nodular opacities, primarily on the right. Correlate with infectious symptoms, as acute on chronic atypical infection could have this appearance. Differential considerations include drug toxicity. 4. Age advanced coronary artery atherosclerosis. Recommend assessment of coronary risk factors and consideration of medical therapy. 5. Pulmonary artery enlargement suggests pulmonary arterial hypertension.  CT ABDOMEN AND PELVIS IMPRESSION  1. No acute process  or evidence of metastatic disease in the abdomen or pelvis. 2. Age advanced atherosclerosis. 3. Left nephrolithiasis.   Electronically Signed   By: Abigail Miyamoto M.D.   On: 09/28/2013 14:30    Microbiology: Recent Results (from the past 240 hour(s))  CULTURE, BLOOD (ROUTINE X 2)     Status: None   Collection Time    10/03/13 11:31 AM      Result Value Ref Range Status   Specimen Description BLOOD RIGHT ANTECUBITAL   Final   Special Requests BOTTLES DRAWN AEROBIC AND ANAEROBIC 5 CC EACH   Final   Culture  Setup Time     Final   Value: 10/03/2013 18:30     Performed at [facility]   Culture     Final   Value:        BLOOD CULTURE RECEIVED NO GROWTH TO DATE CULTURE WILL BE HELD FOR 5 DAYS BEFORE ISSUING A FINAL NEGATIVE REPORT     Performed at [facility]   Report Status PENDING   Incomplete  CULTURE, BLOOD (ROUTINE X 2)     Status: None   Collection Time    10/03/13 11:59 AM      Result Value Ref Range Status   Specimen Description BLOOD LEFT ARM   Final   Special Requests BOTTLES DRAWN AEROBIC AND ANAEROBIC [facility] EACH   Final   Culture  Setup Time     Final   Value: 10/03/2013 18:30     Performed at [facility]   Culture     Final   Value: GRAM POSITIVE RODS     GRAM POSITIVE COCCI IN PAIRS AND CHAINS     Note: Gram Stain Report Called to,Read Back By and Verified With: P CLARK AT 2057 10/04/13 BY SNOLO Gram Stain Report Called to,Read Back By and Verified With: ASHTON MERRITT 10/06/13 0620A [facility]     Performed at [facility]   Report Status 10/07/2013 FINAL   Final  URINE CULTURE     Status: None   Collection Time    10/03/13  1:32 PM      Result Value Ref Range Status   Specimen Description URINE, CLEAN CATCH   Final   Special Requests NONE   Final   Culture  Setup Time     Final   Value: 10/03/2013 22:00     Performed at [place]     Final   Value: 4,000 COLONIES/ML     Performed at [facility]   Culture      Final   Value: INSIGNIFICANT GROWTH     Performed at [facility]   Report Status 10/04/2013 FINAL   Final     Labs: Basic Metabolic Panel:  Recent Labs Lab 10/03/13 1129 10/05/13 1430 10/07/13 0540  NA 135* 135* 138  K 4.8 4.6 3.8  CL 99 99 106  CO2 21  21 17*  GLUCOSE 176* 154* 114*  BUN 32* 26* 14  CREATININE 1.94* 1.80* 1.21*  CALCIUM 9.9 9.4 8.4   Liver Function Tests:  Recent Labs Lab 10/03/13 1129  AST 20  ALT 16  ALKPHOS 58  BILITOT 0.4  PROT 7.9  ALBUMIN 3.7    Recent Labs Lab 10/03/13 1129  LIPASE 22   No results found for this basename: AMMONIA,  in the last 168 hours CBC:  Recent Labs Lab 10/03/13 1129 10/05/13 1430 10/07/13 0540  WBC 7.0 6.7 4.0  NEUTROABS 6.0 5.6  --   HGB 9.3* 8.1* 7.9*  HCT 27.2* 22.9* 21.6*  MCV 95.1 92.0 91.9  PLT 218 206 163   Cardiac Enzymes: No results found for this basename: CKTOTAL, CKMB, CKMBINDEX, TROPONINI,  in the last 168 hours BNP: BNP (last 3 results) No results found for this basename: PROBNP,  in the last 8760 hours CBG:  Recent Labs Lab 10/06/13 1149 10/06/13 1654 10/06/13 2229 10/07/13 0739 10/07/13 1139  GLUCAP 251* 80 167* 125* 194*       Signed:  HERNANDEZ ACOSTA,ESTELA  [facility] Pager: [phone] 10/07/2013, 3:07 PM

## 2013-10-08 NOTE — ED Provider Notes (Signed)
Medical screening examination/treatment/procedure(s) were performed by non-physician practitioner and as supervising physician I was immediately available for consultation/collaboration.   EKG Interpretation None        Blanchie Dessert, MD 10/08/13 1119

## 2013-10-09 LAB — CULTURE, BLOOD (ROUTINE X 2): Culture: NO GROWTH

## 2013-10-19 ENCOUNTER — Inpatient Hospital Stay (HOSPITAL_COMMUNITY): Payer: Medicaid Other

## 2013-10-19 ENCOUNTER — Ambulatory Visit: Payer: Self-pay | Admitting: Gastroenterology

## 2013-10-19 ENCOUNTER — Inpatient Hospital Stay (HOSPITAL_COMMUNITY)
Admission: EM | Admit: 2013-10-19 | Discharge: 2013-10-21 | DRG: 054 | Disposition: A | Discharge status: Home Health | Payer: Medicaid Other | Attending: Internal Medicine | Admitting: Internal Medicine

## 2013-10-19 ENCOUNTER — Encounter (HOSPITAL_COMMUNITY): Payer: Self-pay | Admitting: Emergency Medicine

## 2013-10-19 ENCOUNTER — Emergency Department (HOSPITAL_COMMUNITY): Payer: Medicaid Other

## 2013-10-19 DIAGNOSIS — N179 Acute kidney failure, unspecified: Secondary | ICD-10-CM | POA: Diagnosis present

## 2013-10-19 DIAGNOSIS — E871 Hypo-osmolality and hyponatremia: Secondary | ICD-10-CM | POA: Diagnosis present

## 2013-10-19 DIAGNOSIS — G936 Cerebral edema: Secondary | ICD-10-CM | POA: Diagnosis present

## 2013-10-19 DIAGNOSIS — K208 Other esophagitis without bleeding: Secondary | ICD-10-CM | POA: Diagnosis present

## 2013-10-19 DIAGNOSIS — E43 Unspecified severe protein-calorie malnutrition: Secondary | ICD-10-CM | POA: Insufficient documentation

## 2013-10-19 DIAGNOSIS — IMO0002 Reserved for concepts with insufficient information to code with codable children: Secondary | ICD-10-CM

## 2013-10-19 DIAGNOSIS — R4702 Dysphasia: Secondary | ICD-10-CM | POA: Diagnosis present

## 2013-10-19 DIAGNOSIS — Z823 Family history of stroke: Secondary | ICD-10-CM

## 2013-10-19 DIAGNOSIS — C7949 Secondary malignant neoplasm of other parts of nervous system: Principal | ICD-10-CM

## 2013-10-19 DIAGNOSIS — N189 Chronic kidney disease, unspecified: Secondary | ICD-10-CM

## 2013-10-19 DIAGNOSIS — E785 Hyperlipidemia, unspecified: Secondary | ICD-10-CM | POA: Diagnosis present

## 2013-10-19 DIAGNOSIS — R4701 Aphasia: Secondary | ICD-10-CM | POA: Diagnosis present

## 2013-10-19 DIAGNOSIS — I10 Essential (primary) hypertension: Secondary | ICD-10-CM | POA: Diagnosis present

## 2013-10-19 DIAGNOSIS — E872 Acidosis, unspecified: Secondary | ICD-10-CM | POA: Diagnosis present

## 2013-10-19 DIAGNOSIS — E1165 Type 2 diabetes mellitus with hyperglycemia: Secondary | ICD-10-CM

## 2013-10-19 DIAGNOSIS — Z9181 History of falling: Secondary | ICD-10-CM

## 2013-10-19 DIAGNOSIS — N390 Urinary tract infection, site not specified: Secondary | ICD-10-CM | POA: Diagnosis present

## 2013-10-19 DIAGNOSIS — N183 Chronic kidney disease, stage 3 unspecified: Secondary | ICD-10-CM | POA: Diagnosis present

## 2013-10-19 DIAGNOSIS — Z9221 Personal history of antineoplastic chemotherapy: Secondary | ICD-10-CM

## 2013-10-19 DIAGNOSIS — I129 Hypertensive chronic kidney disease with stage 1 through stage 4 chronic kidney disease, or unspecified chronic kidney disease: Secondary | ICD-10-CM | POA: Diagnosis present

## 2013-10-19 DIAGNOSIS — E119 Type 2 diabetes mellitus without complications: Secondary | ICD-10-CM | POA: Diagnosis present

## 2013-10-19 DIAGNOSIS — Z87891 Personal history of nicotine dependence: Secondary | ICD-10-CM

## 2013-10-19 DIAGNOSIS — I2510 Atherosclerotic heart disease of native coronary artery without angina pectoris: Secondary | ICD-10-CM | POA: Diagnosis present

## 2013-10-19 DIAGNOSIS — IMO0001 Reserved for inherently not codable concepts without codable children: Secondary | ICD-10-CM | POA: Diagnosis present

## 2013-10-19 DIAGNOSIS — J449 Chronic obstructive pulmonary disease, unspecified: Secondary | ICD-10-CM | POA: Diagnosis present

## 2013-10-19 DIAGNOSIS — K2210 Ulcer of esophagus without bleeding: Secondary | ICD-10-CM | POA: Diagnosis present

## 2013-10-19 DIAGNOSIS — Z8249 Family history of ischemic heart disease and other diseases of the circulatory system: Secondary | ICD-10-CM

## 2013-10-19 DIAGNOSIS — C7931 Secondary malignant neoplasm of brain: Principal | ICD-10-CM | POA: Diagnosis present

## 2013-10-19 DIAGNOSIS — Z923 Personal history of irradiation: Secondary | ICD-10-CM

## 2013-10-19 DIAGNOSIS — R112 Nausea with vomiting, unspecified: Secondary | ICD-10-CM | POA: Diagnosis present

## 2013-10-19 DIAGNOSIS — R270 Ataxia, unspecified: Secondary | ICD-10-CM | POA: Diagnosis present

## 2013-10-19 DIAGNOSIS — D638 Anemia in other chronic diseases classified elsewhere: Secondary | ICD-10-CM | POA: Diagnosis present

## 2013-10-19 DIAGNOSIS — C3490 Malignant neoplasm of unspecified part of unspecified bronchus or lung: Secondary | ICD-10-CM | POA: Diagnosis present

## 2013-10-19 LAB — GLUCOSE, CAPILLARY
Glucose-Capillary: 82 mg/dL (ref 70–99)
Glucose-Capillary: 141 mg/dL — ABNORMAL HIGH (ref 70–99)

## 2013-10-19 LAB — CBC
HEMATOCRIT: 32.1 % — AB (ref 36.0–46.0)
HEMOGLOBIN: 11.2 g/dL — AB (ref 12.0–15.0)
MCH: 32.1 pg (ref 26.0–34.0)
MCHC: 34.9 g/dL (ref 30.0–36.0)
MCV: 92 fL (ref 78.0–100.0)
Platelets: 392 10*3/uL (ref 150–400)
RBC: 3.49 MIL/uL — ABNORMAL LOW (ref 3.87–5.11)
RDW: 12.5 % (ref 11.5–15.5)
WBC: 7 10*3/uL (ref 4.0–10.5)

## 2013-10-19 LAB — BASIC METABOLIC PANEL
BUN: 21 mg/dL (ref 6–23)
BUN: 20 mg/dL (ref 6–23)
CHLORIDE: 92 meq/L — AB (ref 96–112)
CO2: 25 mEq/L (ref 19–32)
CO2: 22 meq/L (ref 19–32)
CREATININE: 1.45 mg/dL — AB (ref 0.50–1.10)
Calcium: 9.9 mg/dL (ref 8.4–10.5)
Calcium: 9.3 mg/dL (ref 8.4–10.5)
Chloride: 100 mEq/L (ref 96–112)
Creatinine, Ser: 1.29 mg/dL — ABNORMAL HIGH (ref 0.50–1.10)
GFR calc non Af Amer: 39 mL/min — ABNORMAL LOW (ref >90)
GFR calc non Af Amer: 45 mL/min — ABNORMAL LOW (ref >90)
GFR, EST AFRICAN AMERICAN: 45 mL/min — AB (ref >90)
GFR, EST AFRICAN AMERICAN: 52 mL/min — AB (ref >90)
GLUCOSE: 361 mg/dL — AB (ref 70–99)
Glucose, Bld: 145 mg/dL — ABNORMAL HIGH (ref 70–99)
Potassium: 4.6 mEq/L (ref 3.7–5.3)
Potassium: 4.7 mEq/L (ref 3.7–5.3)
SODIUM: 137 meq/L (ref 137–147)
Sodium: 134 mEq/L — ABNORMAL LOW (ref 137–147)

## 2013-10-19 LAB — URINE MICROSCOPIC-ADD ON

## 2013-10-19 LAB — URINALYSIS, ROUTINE W REFLEX MICROSCOPIC
GLUCOSE, UA: 100 mg/dL — AB
Hgb urine dipstick: NEGATIVE
KETONES UR: 15 mg/dL — AB
Nitrite: NEGATIVE
Protein, ur: 30 mg/dL — AB
Specific Gravity, Urine: 1.02 (ref 1.005–1.030)
Urobilinogen, UA: 1 mg/dL (ref 0.0–1.0)
pH: 5.5 (ref 5.0–8.0)

## 2013-10-19 LAB — RAPID URINE DRUG SCREEN, HOSP PERFORMED
AMPHETAMINES: NOT DETECTED
BARBITURATES: NOT DETECTED
Benzodiazepines: NOT DETECTED
Cocaine: NOT DETECTED
OPIATES: NOT DETECTED
TETRAHYDROCANNABINOL: NOT DETECTED

## 2013-10-19 LAB — I-STAT TROPONIN, ED: Troponin i, poc: 0 ng/mL (ref 0.00–0.08)

## 2013-10-19 LAB — HEMOGLOBIN A1C
Hgb A1c MFr Bld: 6.2 % — ABNORMAL HIGH (ref <5.7)
Mean Plasma Glucose: 131 mg/dL — ABNORMAL HIGH (ref <117)

## 2013-10-19 LAB — CREATININE, SERUM

## 2013-10-19 LAB — ETHANOL: Alcohol, Ethyl (B): <11 mg/dL (ref 0–11)

## 2013-10-19 LAB — I-STAT CG4 LACTIC ACID, ED: Lactic Acid, Venous: 3.12 mmol/L — ABNORMAL HIGH (ref 0.5–2.2)

## 2013-10-19 MED ORDER — ONDANSETRON HCL 4 MG/2ML IJ SOLN: 4.0000 mg | Freq: Four times a day (QID) | INTRAMUSCULAR | Status: DC | PRN

## 2013-10-19 MED ORDER — SODIUM CHLORIDE 0.9 % IV SOLN
INTRAVENOUS | Status: DC
  Administered 2013-10-19 – 2013-10-20 (×3): via INTRAVENOUS

## 2013-10-19 MED ORDER — ALUM & MAG HYDROXIDE-SIMETH 200-200-20 MG/5ML PO SUSP: 30.0000 mL | Freq: Four times a day (QID) | ORAL | Status: DC | PRN

## 2013-10-19 MED ORDER — INSULIN NPH (HUMAN) (ISOPHANE) 100 UNIT/ML ~~LOC~~ SUSP
10.0000 [IU] | SUBCUTANEOUS | Status: AC
  Administered 2013-10-19: 10 [IU] via SUBCUTANEOUS
  Filled 2013-10-19: qty 10

## 2013-10-19 MED ORDER — DEXAMETHASONE SODIUM PHOSPHATE 10 MG/ML IJ SOLN
4.0000 mg | Freq: Once | INTRAMUSCULAR | Status: AC
Start: 2013-10-19 — End: 2013-10-19
  Administered 2013-10-19: 10 mg via INTRAVENOUS
  Filled 2013-10-19: qty 1

## 2013-10-19 MED ORDER — DEXAMETHASONE SODIUM PHOSPHATE 4 MG/ML IJ SOLN
4.0000 mg | Freq: Four times a day (QID) | INTRAMUSCULAR | Status: DC
  Administered 2013-10-19 – 2013-10-21 (×7): 4 mg via INTRAVENOUS
  Filled 2013-10-19 (×8): qty 1

## 2013-10-19 MED ORDER — ENOXAPARIN SODIUM 40 MG/0.4ML ~~LOC~~ SOLN
40.0000 mg | SUBCUTANEOUS | Status: DC
  Administered 2013-10-19 – 2013-10-21 (×3): 40 mg via SUBCUTANEOUS
  Filled 2013-10-19 (×3): qty 0.4

## 2013-10-19 MED ORDER — HYDROCODONE-ACETAMINOPHEN 5-325 MG PO TABS: 1.0000 | ORAL_TABLET | ORAL | Status: DC | PRN

## 2013-10-19 MED ORDER — MORPHINE SULFATE 2 MG/ML IJ SOLN
1.0000 mg | INTRAMUSCULAR | Status: DC | PRN
  Administered 2013-10-20: 1 mg via INTRAVENOUS
  Filled 2013-10-19: qty 1

## 2013-10-19 MED ORDER — ACETAMINOPHEN 650 MG RE SUPP: 650.0000 mg | Freq: Four times a day (QID) | RECTAL | Status: DC | PRN

## 2013-10-19 MED ORDER — INSULIN ASPART 100 UNIT/ML ~~LOC~~ SOLN
0.0000 [IU] | Freq: Every day | SUBCUTANEOUS | Status: DC
Start: 2013-10-19 — End: 2013-10-21

## 2013-10-19 MED ORDER — SODIUM CHLORIDE 0.9 % IV BOLUS (SEPSIS): 500.0000 mL | Freq: Once | INTRAVENOUS | Status: DC

## 2013-10-19 MED ORDER — SODIUM CHLORIDE 0.9 % IV SOLN
250.0000 mg | Freq: Two times a day (BID) | INTRAVENOUS | Status: DC
  Administered 2013-10-20 (×2): 250 mg via INTRAVENOUS
  Filled 2013-10-19 (×3): qty 2.5

## 2013-10-19 MED ORDER — ZOLPIDEM TARTRATE 5 MG PO TABS: 5.0000 mg | ORAL_TABLET | Freq: Every evening | ORAL | Status: DC | PRN

## 2013-10-19 MED ORDER — SODIUM CHLORIDE 0.9 % IV SOLN
1000.0000 mL | Freq: Once | INTRAVENOUS | Status: AC
  Administered 2013-10-19: 1000 mL via INTRAVENOUS

## 2013-10-19 MED ORDER — SODIUM CHLORIDE 0.9 % IJ SOLN
3.0000 mL | Freq: Two times a day (BID) | INTRAMUSCULAR | Status: DC
  Administered 2013-10-19 – 2013-10-21 (×4): 3 mL via INTRAVENOUS

## 2013-10-19 MED ORDER — INSULIN ASPART 100 UNIT/ML ~~LOC~~ SOLN
0.0000 [IU] | Freq: Three times a day (TID) | SUBCUTANEOUS | Status: DC
  Administered 2013-10-20 (×2): 4 [IU] via SUBCUTANEOUS
  Administered 2013-10-20: 7 [IU] via SUBCUTANEOUS
  Administered 2013-10-21: 11 [IU] via SUBCUTANEOUS
  Administered 2013-10-21: 4 [IU] via SUBCUTANEOUS

## 2013-10-19 MED ORDER — DOCUSATE SODIUM 100 MG PO CAPS
100.0000 mg | ORAL_CAPSULE | Freq: Two times a day (BID) | ORAL | Status: DC
  Administered 2013-10-19 – 2013-10-21 (×4): 100 mg via ORAL
  Filled 2013-10-19 (×5): qty 1

## 2013-10-19 MED ORDER — ACETAMINOPHEN 325 MG PO TABS: 650.0000 mg | ORAL_TABLET | Freq: Four times a day (QID) | ORAL | Status: DC | PRN

## 2013-10-19 MED ORDER — ONDANSETRON HCL 4 MG PO TABS: 4.0000 mg | ORAL_TABLET | Freq: Four times a day (QID) | ORAL | Status: DC | PRN

## 2013-10-19 MED ORDER — SODIUM CHLORIDE 0.9 % IV SOLN
250.0000 mg | Freq: Once | INTRAVENOUS | Status: AC
  Administered 2013-10-19: 250 mg via INTRAVENOUS
  Filled 2013-10-19: qty 2.5

## 2013-10-19 MED ORDER — DEXAMETHASONE SODIUM PHOSPHATE 10 MG/ML IJ SOLN: 10.0000 mg | Freq: Once | INTRAMUSCULAR | Status: DC

## 2013-10-19 NOTE — H&P (Signed)
The patient was seen and examined. Her chart, vital signs, and laboratory studies were reviewed. She was discussed with nurse practitioner, Ms. Renard Hamper. Agree with her assessment and plan with additions below. The patient is a 58 year old woman with a history significant for small cell lung cancer, status post chemotherapy and radiation, CAD diabetes mellitus, erosive ulcerative esophagitis, who presents with difficulty speaking, dizziness, headache, and unsteady gait. Workup in the ED revealed extensive edema around multiple lesions especially in the left thalamus and midbrain consistent with multiple metastatic deposits per CT of the brain. Per my conversation with the ED physician Dr. Christy Gentles, he discussed the patient with her oncologist, Dr. Earlie Server who recommended IV Decadron and then discharge her to home while he would arrange followup with radiation- oncology in [place]. However, it was felt that the patient could be admitted for further observation and management.  On exam, the patient has expressive aphasia, mild rotary nystagmus dysmetria on the left greater than the right, and a mildly ataxic gait. She has a mild right pronator drift. She follows directions well. She does not have dysarthria.  We will continue IV Decadron every 6 hours and start IV Keppra as discussed with Ms. Renard Hamper. We will order MRI of the brain for further evaluation. We will need to followup with Dr. Earlie Server via phone call to confirm if the radiation-oncology appointment was made for the patient. Further management as outlined in NP Ms. Alfonso Ramus note.

## 2013-10-19 NOTE — ED Notes (Signed)
Pt states has been dehydrated off and on, cancer pt, does not take po fluids very well or often. Pt's BP 85/57 in triage, pt's eyes sunken, poor oral mucosa, dry and pale. Pt has had dizziness for past few days and has fallen several times, denies head injury.

## 2013-10-19 NOTE — ED Notes (Signed)
Patient was dizzy upon sitting and standing.  Patient was ambulatory with assistance to the restroom.

## 2013-10-19 NOTE — ED Provider Notes (Signed)
CSN: [id number]     Arrival date & time 10/19/13  1130 History  This chart was scribed for Carmen Cable, MD by Cathie Hoops, ED Scribe. The patient was seen in [place]. The patient's care was started at 12:36 PM.     Chief Complaint  Patient presents with  . Dizziness   Patient is a 58 y.o. female presenting with weakness. The history is provided by the patient. No language interpreter was used.  Weakness The current episode started more than 2 days ago. The problem occurs daily. The problem has been gradually improving. Associated symptoms include headaches. Pertinent negatives include no chest pain, no abdominal pain and no shortness of breath.   HPI Comments: [name] is a 58 y.o. female with past medical history of lung CA who presents to the Emergency Department complaining of constant, generalized weakness onset 4-5 days ago. She reports associated "room spinning" dizziness. Pt states she is experiencing associated non-bloody vomiting, headaches. Per relative, pt has been having difficulty speaking and difficulty with thoughts. Pt denies diarrhea, chest pain, LOC, abdominal pain, cough, numbness, and SOB. Pt denies adding new medications.   Past Medical History  Diagnosis Date  . Tobacco abuse   . Diabetes mellitus   . Mitral regurgitation   . Hypertension   . Dyslipidemia   . Coronary artery disease   . History of echocardiogram 7/10    EF 50-55%; inferior hypokinesis; RV function mild decreased  . ACE-inhibitor cough   . Multiple allergies     Penicillin and mushrooms  . Cancer     limited stage small cell lung  . Ulcer     ulcer on stomach, throat, and trachea per pt on 10/05/13   Past Surgical History  Procedure Laterality Date  . Video bronchoscopy Bilateral 01/26/2013    Procedure: VIDEO BRONCHOSCOPY WITHOUT FLUORO;  Surgeon: Tanda Rockers, MD;  Location: [facility];  Service: Cardiopulmonary;  Laterality: Bilateral;  . Eye surgery    . Bilateral  oophorectomy  2012    benign tumors  . Esophagogastroduodenoscopy N/A 09/07/2013    Procedure: ESOPHAGOGASTRODUODENOSCOPY (EGD);  Surgeon: Danie Binder, MD;  Location: [facility];  Service: Endoscopy;  Laterality: N/A;  9:30  . Maloney dilation N/A 09/07/2013    Procedure: Venia Minks DILATION;  Surgeon: Danie Binder, MD;  Location: [facility];  Service: Endoscopy;  Laterality: N/A;  . Savory dilation N/A 09/07/2013    Procedure: SAVORY DILATION;  Surgeon: Danie Binder, MD;  Location: [facility];  Service: Endoscopy;  Laterality: N/A;   Family History  Problem Relation Age of Onset  . Stroke Mother   . Coronary artery disease Father    History  Substance Use Topics  . Smoking status: Former Smoker -- 1.50 packs/day for 40 years    Types: Cigarettes    Quit date: 01/30/2013  . Smokeless tobacco: Never Used  . Alcohol Use: No   OB History   Grav Para Term Preterm Abortions TAB SAB Ect Mult Living                 Review of Systems  Respiratory: Negative for shortness of breath.   Cardiovascular: Negative for chest pain.  Gastrointestinal: Positive for vomiting (non-bloody). Negative for abdominal pain.  Neurological: Positive for dizziness ("room spinning"), speech difficulty (per pt relative), weakness (generalized) and headaches. Negative for syncope.  Psychiatric/Behavioral: Positive for confusion (per pt relative).  All other systems reviewed and are negative.  Allergies  Aspirin; Codeine; Mushroom extract complex; and Penicillins  Home Medications   Prior to Admission medications   Medication Sig Start Date End Date Taking? Authorizing Provider  B Complex-C (SUPER B COMPLEX PO) Take 1 tablet by mouth daily.   Yes Historical Provider, MD  metFORMIN (GLUCOPHAGE) 850 MG tablet Take 850 mg by mouth 2 (two) times daily with a meal.   Yes Historical Provider, MD  vitamin B-12 (CYANOCOBALAMIN) 1000 MCG tablet Take 1,000 mcg by mouth daily.   Yes Historical  Provider, MD  lovastatin (MEVACOR) 20 MG tablet Take 20 mg by mouth at bedtime.    Historical Provider, MD  ondansetron (ZOFRAN ODT) 8 MG disintegrating tablet Take 1 tablet (8 mg total) by mouth every 8 (eight) hours as needed for nausea or vomiting. 10/03/13   Hoy Morn, MD   Triage Vitals: BP 98/72  Pulse 111  Temp(Src) 98.7 F (37.1 C) (Oral)  Resp 16  Ht 5' 6.75" (1.695 m)  Wt 130 lb (58.968 kg)  BMI 20.52 kg/m2  SpO2 97%  Physical Exam  Nursing note and vitals reviewed.  CONSTITUTIONAL: Well developed/well nourished HEAD: Normocephalic/atraumatic EYES: EOMI/PERRL ENMT: Mucous membranes dry NECK: supple no meningeal signs SPINE:entire spine nontender CV: S1/S2 noted, no murmurs/rubs/gallops noted LUNGS: Lungs are clear to auscultation bilaterally, no apparent distress ABDOMEN: soft, nontender, no rebound or guarding GU:no cva tenderness NEURO: Pt is awake/alert, moves all extremitiesx4, no arm or leg drift, no facial droop, pt with some past-pointing, mostly on right arm EXTREMITIES: pulses normal, full ROM SKIN: warm, color normal PSYCH: no abnormalities of mood noted   ED Course  Procedures  DIAGNOSTIC STUDIES: Oxygen Saturation is 97% on RA, normal by my interpretation.    COORDINATION OF CARE: 12:42 PM- Will order CT Scan per relative pt has had speech difficulty. Patient informed of current plan for treatment and evaluation and agrees with plan at this time. 3:25 PM D/w dr Earlie Server with oncology He reports she needs decadron 4mg  IV He will arrange for outpatient radiation at [facility] He feels patient can be discharged home or admitted at [facility] for observation 3:35 PM  D/w dr Caryn Section She will admit to tele and start keppra for seizure prophylaxis . Labs Review Labs Reviewed  CBC - Abnormal; Notable for the following:    RBC 3.49 (*)    Hemoglobin 11.2 (*)    HCT 32.1 (*)    All other components within normal limits  BASIC METABOLIC PANEL -  Abnormal; Notable for the following:    Sodium 134 (*)    Chloride 92 (*)    Glucose, Bld 361 (*)    Creatinine, Ser 1.45 (*)    GFR calc non Af Amer 39 (*)    GFR calc Af Amer 45 (*)    All other components within normal limits  URINALYSIS, ROUTINE W REFLEX MICROSCOPIC - Abnormal; Notable for the following:    Glucose, UA 100 (*)    Bilirubin Urine SMALL (*)    Ketones, ur 15 (*)    Protein, ur 30 (*)    Leukocytes, UA SMALL (*)    All other components within normal limits  URINE MICROSCOPIC-ADD ON - Abnormal; Notable for the following:    Squamous Epithelial / LPF MANY (*)    Bacteria, UA FEW (*)    All other components within normal limits  ETHANOL  URINE RAPID DRUG SCREEN (HOSP PERFORMED)  I-STAT TROPOININ, ED  I-STAT CG4 LACTIC ACID, ED  Imaging Review Ct Head Wo Contrast  10/19/2013   CLINICAL DATA:  Lung cancer.  Dizziness.  EXAM: CT HEAD WITHOUT CONTRAST  TECHNIQUE: Contiguous axial images were obtained from the base of the skull through the vertex without intravenous contrast.  COMPARISON:  MRI 06/15/2013  FINDINGS: 1 cm hyperdense mass in the left medial thalamus with surrounding edema. 6 mm hyperdense lesion in the right medial parietal lobe with surrounding edema. There is edema in the left parietal lobe. There is edema in the frontal lobes bilaterally. 6 mm hyperdense lesion in the high left frontal lobe. Edema in the posterior temporal lobe bilaterally. These areas are all consistent with metastatic disease. No metastatic deposits were seen on the prior MRI.  Ventricle size is normal. No shift of the midline structures. No skull lesion identified.  IMPRESSION: Multiple brain lesions are present some of which may be slightly hemorrhagic. There is extensive edema around multiple lesions especially in the left thalamus and midbrain. Findings are consistent with multiple metastatic deposits. Further evaluation with MRI brain without and with contrast suggested.   Electronically  Signed   By: Franchot Gallo M.D.   On: 10/19/2013 14:24     EKG Interpretation   Date/Time:  Tuesday October 19 2013 12:04:34 EDT Ventricular Rate:  110 PR Interval:  143 QRS Duration: 75 QT Interval:  343 QTC Calculation: 464 R Axis:   84 Text Interpretation:  Sinus tachycardia Probable lateral infarct, old  Baseline wander in lead(s) I II aVR V4 No significant change since last  tracing Confirmed by Christy Gentles  MD, [place] ([id number]) on 10/19/2013 12:12:42 PM      MDM   Final diagnoses:  Brain metastases    Nursing notes including past medical history and social history reviewed and considered in documentation Labs/vital reviewed and considered   I personally performed the services described in this documentation, which was scribed in my presence. The recorded information has been reviewed and is accurate.      Carmen Cable, MD 10/19/13 1537

## 2013-10-19 NOTE — Telephone Encounter (Signed)
Dr. Julien Nordmann aware per ED provider note. Husband notified.

## 2013-10-19 NOTE — H&P (Signed)
[facility] History and Physical  [name] JOI:[id number] DOB: Aug 01, 1955 DOA: 10/19/2013  Referring physician:  PCP: Raiford Simmonds., PA-C   Chief Complaint:   HPI: [name] is a 58 y.o. female with a past medical history significant for small cell lung cancer last chemotherapy and radiation in February and March respectively, diabetes, hypertension, CAD, presents to the emergency department with the chief complaint of ataxia and dysphasia over the last 3-4 days. Initial workup reveals brain lesions with extensive edema.  Information is obtained from the patient and her friend who is at the bedside and participate in her care. She reports that over the last for 3-4 days she developed dizziness with headache, unsteady gait with 1 recent fall where she did not hit her head or lose consciousness, and difficulty expressing herself. Associated symptoms include nausea with 2 episodes of nonbloody emesis, decreased by mouth intake and generalized weakness. She denies visual disturbances difficulty swallowing numbness or tingling of extremities. She denies syncope or near-syncope. She denies fever chills cough chest pain palpitations abdominal pain dysuria hematuria. Her friend reports that this morning when she got up the expressive aphasia was worse and the patient was requesting to come to the hospital.   Evaluation in the emergency department yields basic metabolic panel significant for sodium of 134, chloride 92, creatinine 1.45, serum glucose 361. Complete blood count significant for hemoglobin of 11.2. Lactic acid is 3.12. Urinalysis with many squama cell, few bacteria, RBCs 3-6, WBCs 11-20, small leukocytes. ET of the head with multiple brain lesions with extensive edema. EKG with sinus tachycardia.  In the emergency department she was given 2 L of normal saline intravenously, 4 mg of Decadron intravenously, 10 units of NPH insulin intravenously, 250 mg of Keppra  intravenously.  Upon my examination she is alert and oriented. She is able to follow commands. Next temp 99.4 heart rate 100 blood pressure stable at 138/79. She is nontoxic appearing and she is not hypoxic.   Review of Systems:  10 point review of systems completed and all systems are negative except as indicated in the history of present illness   Past Medical History  Diagnosis Date  . Tobacco abuse   . Diabetes mellitus   . Mitral regurgitation   . Hypertension   . Dyslipidemia   . Coronary artery disease   . History of echocardiogram 7/10    EF 50-55%; inferior hypokinesis; RV function mild decreased  . ACE-inhibitor cough   . Multiple allergies     Penicillin and mushrooms  . Cancer     limited stage small cell lung  . Ulcer     ulcer on stomach, throat, and trachea per pt on 10/05/13   Past Surgical History  Procedure Laterality Date  . Video bronchoscopy Bilateral 01/26/2013    Procedure: VIDEO BRONCHOSCOPY WITHOUT FLUORO;  Surgeon: Tanda Rockers, MD;  Location: [facility];  Service: Cardiopulmonary;  Laterality: Bilateral;  . Eye surgery    . Bilateral oophorectomy  2012    benign tumors  . Esophagogastroduodenoscopy N/A 09/07/2013    Procedure: ESOPHAGOGASTRODUODENOSCOPY (EGD);  Surgeon: Danie Binder, MD;  [facility];  Service: Endoscopy;  Laterality: N/A;  9:30  . Maloney dilation N/A 09/07/2013    Procedure: Venia Minks DILATION;  Surgeon: Danie Binder, MD;  Location: [facility];  Service: Endoscopy;  Laterality: N/A;  . Savory dilation N/A 09/07/2013    Procedure: SAVORY DILATION;  Surgeon: Danie Binder, MD;  Location: [facility];  Service: Endoscopy;  Laterality: N/A;   Social History:  reports that she quit smoking about 8 months ago. Her smoking use included Cigarettes. She has a 60 pack-year smoking history. She has never used smokeless tobacco. She reports that she does not drink alcohol or use illicit drugs. She lives alone but has a  sister who lives nearby and they) with her care. She is minimal assist with ADLs Allergies  Allergen Reactions  . Aspirin     Has an ulcer, MD told her not to take anymore  . Codeine Nausea And Vomiting  . Mushroom Extract Complex     Swelling of eyes and face  . Penicillins Other (See Comments)    Childhood allergy    Family History  Problem Relation Age of Onset  . Stroke Mother   . Coronary artery disease Father      Prior to Admission medications   Medication Sig Start Date End Date Taking? Authorizing Provider  B Complex-C (SUPER B COMPLEX PO) Take 1 tablet by mouth daily.   Yes Historical Provider, MD  metFORMIN (GLUCOPHAGE) 850 MG tablet Take 850 mg by mouth 2 (two) times daily with a meal.   Yes Historical Provider, MD  vitamin B-12 (CYANOCOBALAMIN) 1000 MCG tablet Take 1,000 mcg by mouth daily.   Yes Historical Provider, MD  lovastatin (MEVACOR) 20 MG tablet Take 20 mg by mouth at bedtime.    Historical Provider, MD  ondansetron (ZOFRAN ODT) 8 MG disintegrating tablet Take 1 tablet (8 mg total) by mouth every 8 (eight) hours as needed for nausea or vomiting. 10/03/13   Hoy Morn, MD   Physical Exam: Filed Vitals:   10/19/13 1500  BP: 138/79  Pulse: 98  Temp:   Resp: 18    BP 138/79  Pulse 98  Temp(Src) 99.4 F (37.4 C) (Oral)  Resp 18  Ht 5' 6.75" (1.695 m)  Wt 58.968 kg (130 lb)  BMI 20.52 kg/m2  SpO2 95%  General:  Appears calm and comfortable, slightly lethargic Eyes: PERRL, normal lids, irises & conjunctiva ENT: Ears are clear nose without drainage oropharynx without erythema or exudate. Because membranes of her mouth are moist and pink Neck: no LAD, masses or thyromegaly Cardiovascular: S1 and S2 noted I hear no murmur no gallop no rub. There is no lower extremity edema Respiratory: Normal effort breath sounds are clear bilaterally I hear no wheeze no rhonchi Abdomen: soft, ntnd no mass organomegaly noted no guarding no rebound Skin: no rash or  induration seen on limited exam Musculoskeletal: grossly normal tone BUE/BLE  Psychiatric: grossly normal mood and affect, speech  Neurologic: grossly non-focal. Alert oriented able to follow commands. Expressive dysphagia with word searching but speech is clear. Cranial nerves II through XII grossly intact.           Labs on Admission:  Basic Metabolic Panel:  Recent Labs Lab 10/19/13 1138  NA 134*  K 4.6  CL 92*  CO2 25  GLUCOSE 361*  BUN 21  CREATININE 1.45*  CALCIUM 9.9   Liver Function Tests: No results found for this basename: AST, ALT, ALKPHOS, BILITOT, PROT, ALBUMIN,  in the last 168 hours No results found for this basename: LIPASE, AMYLASE,  in the last 168 hours No results found for this basename: AMMONIA,  in the last 168 hours CBC:  Recent Labs Lab 10/19/13 1138  WBC 7.0  HGB 11.2*  HCT 32.1*  MCV 92.0  PLT 392   Cardiac Enzymes: No results found  for this basename: CKTOTAL, CKMB, CKMBINDEX, TROPONINI,  in the last 168 hours  BNP (last 3 results) No results found for this basename: PROBNP,  in the last 8760 hours CBG: No results found for this basename: GLUCAP,  in the last 168 hours  Radiological Exams on Admission: Ct Head Wo Contrast  10/19/2013   CLINICAL DATA:  Lung cancer.  Dizziness.  EXAM: CT HEAD WITHOUT CONTRAST  TECHNIQUE: Contiguous axial images were obtained from the base of the skull through the vertex without intravenous contrast.  COMPARISON:  MRI 06/15/2013  FINDINGS: 1 cm hyperdense mass in the left medial thalamus with surrounding edema. 6 mm hyperdense lesion in the right medial parietal lobe with surrounding edema. There is edema in the left parietal lobe. There is edema in the frontal lobes bilaterally. 6 mm hyperdense lesion in the high left frontal lobe. Edema in the posterior temporal lobe bilaterally. These areas are all consistent with metastatic disease. No metastatic deposits were seen on the prior MRI.  Ventricle size is normal.  No shift of the midline structures. No skull lesion identified.  IMPRESSION: Multiple brain lesions are present some of which may be slightly hemorrhagic. There is extensive edema around multiple lesions especially in the left thalamus and midbrain. Findings are consistent with multiple metastatic deposits. Further evaluation with MRI brain without and with contrast suggested.   Electronically Signed   By: Franchot Gallo M.D.   On: 10/19/2013 14:24    EKG: Independently reviewed sinus tachycardia  Assessment/Plan Principal Problem:   Ataxia/ dysphasia: Likely secondary to brain metastases with associated edema per CT. Will admit to telemetry. Will obtain MRI of the brain for further evaluation. Will provide Decadron 4 mg every 6 hours intravenously. Will also provide Keppra 250 mg intravenously prophylactically as she is most likely at risk for seizure. Will ask speech therapy for bedside swallow eval. Of note patient has had odynophagia during her chemotherapy and radiation. EGD done in may of this year reveals ODYNOPHAGIA DUE TO ESOPHAGEAL ULCER, NAUSEA AND VOMITING DUE TO ULCERATIVE ESOPHAGISTIS, MILD Erosive gastritis, AND MODERTAE Duodenitis.  Active Problems:  Brain metastasis; per CT as noted above. Patient with histo DUODENITISry of small cell lung cancer last chemotherapy and radiation in February and March respectively. Chart review also indicates she was seen by Dr. Julien Nordmann on June 10 of this year. Progress note indicates status post 6 cycles of chemotherapy with concurrent radiotherapy in addition to prophylactic cranial irradiation. Will continue intravenous Decadron that was started in the emergency department. Also provide Keppra intravenously as she is most likely at risk for seizure. Will obtain an MRI for further evaluation. Dr. Julien Nordmann has been notified and indicated he would arrange for radiation in [place].    Acute on chronic renal failure: Likely related to decreased by mouth intake.  Current creatinine level seems to be at just above baseline. Baseline appears to be 1.2-1.3 when not ill.  Will gently hydrate with IV fluids. Will hold any nephrotoxins. Will monitor intake and output. Will recheck in the a.m. If no improvement will consider renal ultrasound.  DM: Uncontrolled. Serum glucose on admission 361. Anion gap 17. Home medications are metformin. She was given 10 units of insulin intravenously in the emergency department. We will obtain a hemoglobin A1c. Will provide sliding scale insulin for optimal control. Anticipate further elevation do to steroids. Will use resistant scale.    Hyponatremia: Mild. Likely related to #4. Provide vigorous IV fluids. Potassium level 4.6. Will recheck a basic  metabolic panel at 10 PM tonight. Expect electrolytes to improve 1 serum glucose controlled.      HYPERTENSION: Controlled in the emergency department. He is not on any antihypertensives at home. Will monitor closely    CAD: No chest pain. Will monitor on telemetry. Slightly tachycardic in the emergency department suspect this is do to #4.    COPD clinical dx: Appears stable at baseline    Small cell lung carcinoma: See #1.      Anemia of chronic disease: Current hemoglobin 11.2 which is above her baseline which appears to be between 8 and 9. Likely due to dehydration. No signs or symptoms of bleeding. Will give IV fluids and recheck in the morning.      Code Status: full Family Communication: daughter and friend at bedside Disposition Plan: home when ready  Time spent: 29 minutes  [facility] Pager [phone]  **Disclaimer: This note may have been dictated with voice recognition software. Similar sounding words can inadvertently be transcribed and this note may contain transcription errors which may not have been corrected upon publication of note.**

## 2013-10-19 NOTE — ED Notes (Signed)
Pt given 4 mg of decadron, not 10 mg

## 2013-10-20 DIAGNOSIS — R279 Unspecified lack of coordination: Secondary | ICD-10-CM

## 2013-10-20 DIAGNOSIS — E43 Unspecified severe protein-calorie malnutrition: Secondary | ICD-10-CM | POA: Insufficient documentation

## 2013-10-20 DIAGNOSIS — R4789 Other speech disturbances: Secondary | ICD-10-CM

## 2013-10-20 LAB — BASIC METABOLIC PANEL
BUN: 19 mg/dL (ref 6–23)
CHLORIDE: 102 meq/L (ref 96–112)
CO2: 21 mEq/L (ref 19–32)
Calcium: 9.2 mg/dL (ref 8.4–10.5)
Creatinine, Ser: 1.2 mg/dL — ABNORMAL HIGH (ref 0.50–1.10)
GFR calc non Af Amer: 49 mL/min — ABNORMAL LOW (ref >90)
GFR, EST AFRICAN AMERICAN: 57 mL/min — AB (ref >90)
GLUCOSE: 151 mg/dL — AB (ref 70–99)
Potassium: 4.5 mEq/L (ref 3.7–5.3)
SODIUM: 140 meq/L (ref 137–147)

## 2013-10-20 LAB — CBC
HEMATOCRIT: 28.6 % — AB (ref 36.0–46.0)
Hemoglobin: 9.9 g/dL — ABNORMAL LOW (ref 12.0–15.0)
MCH: 31.6 pg (ref 26.0–34.0)
MCHC: 34.6 g/dL (ref 30.0–36.0)
MCV: 91.4 fL (ref 78.0–100.0)
Platelets: 302 10*3/uL (ref 150–400)
RBC: 3.13 MIL/uL — ABNORMAL LOW (ref 3.87–5.11)
RDW: 12.5 % (ref 11.5–15.5)
WBC: 4.6 10*3/uL (ref 4.0–10.5)

## 2013-10-20 LAB — GLUCOSE, CAPILLARY
GLUCOSE-CAPILLARY: 199 mg/dL — AB (ref 70–99)
GLUCOSE-CAPILLARY: 169 mg/dL — AB (ref 70–99)
Glucose-Capillary: 155 mg/dL — ABNORMAL HIGH (ref 70–99)
Glucose-Capillary: 230 mg/dL — ABNORMAL HIGH (ref 70–99)

## 2013-10-20 MED ORDER — GADOBENATE DIMEGLUMINE 529 MG/ML IV SOLN
6.0000 mL | Freq: Once | INTRAVENOUS | Status: AC | PRN
  Administered 2013-10-20: 6 mL via INTRAVENOUS

## 2013-10-20 MED ORDER — LEVETIRACETAM 500 MG PO TABS
250.0000 mg | ORAL_TABLET | Freq: Two times a day (BID) | ORAL | Status: DC
  Administered 2013-10-20 – 2013-10-21 (×2): 250 mg via ORAL
  Filled 2013-10-20 (×2): qty 1

## 2013-10-20 MED ORDER — ENSURE COMPLETE PO LIQD
237.0000 mL | Freq: Three times a day (TID) | ORAL | Status: DC
  Administered 2013-10-20 – 2013-10-21 (×4): 237 mL via ORAL

## 2013-10-20 MED ORDER — LORAZEPAM 2 MG/ML IJ SOLN
1.0000 mg | Freq: Once | INTRAMUSCULAR | Status: AC
  Administered 2013-10-20: 1 mg via INTRAVENOUS
  Filled 2013-10-20: qty 1

## 2013-10-20 NOTE — Evaluation (Signed)
Physical Therapy Evaluation Patient Details Name: [name] MRN: [id number] DOB: [birth year] Today's Date: 10/20/2013   History of Present Illness  [name] is a 58 y.o. female with a past medical history significant for small cell lung cancer last chemotherapy and radiation in February and March respectively, diabetes, hypertension, CAD, presents to the emergency department with the chief complaint of ataxia and dysphasia over the last 3-4 days. Initial workup reveals brain lesions with extensive edema.  Information is obtained from the patient and her friend who is at the bedside and participate in her care. She reports that over the last for 3-4 days she developed dizziness with headache, unsteady gait with 1 recent fall where she did not hit her head or lose consciousness, and difficulty expressing herself. Associated symptoms include nausea with 2 episodes of nonbloody emesis, decreased by mouth intake and generalized weakness. She denies visual disturbances difficulty swallowing numbness or tingling of extremities. She denies syncope or near-syncope. She denies fever chills cough chest pain palpitations abdominal pain dysuria hematuria. Her friend reports that this morning when she got up the expressive aphasia was worse and the patient was requesting to come to the hospital.    Clinical Impression  Pt is a 58 year old female who presents to PT for assessment of functional mobility skills.  Pt is a poor historian, and history varied throughout eval and with past hospitalizations.  During evaluation, pt able to complete bed mobility with min guard, transfer with min guard and use of RW and amb with use of RW and min assist secondary to LOB to the Rt.  Noted ataxic gait pattern during assessment.  Recommend continued PT while in the hospital to address strength, activity tolerance, and balance.  Pt will require 24/7 supervision for safety at discharge.  Recommend use of RW for safe gait due to  ataxic pattern.     Follow Up Recommendations Supervision/Assistance - 24 hour    Equipment Recommendations  Rolling walker with 5" wheels       Precautions / Restrictions Precautions Precautions: Fall Restrictions Weight Bearing Restrictions: No      Mobility  Bed Mobility Overal bed mobility: Needs Assistance Bed Mobility: Supine to Sit;Sit to Supine     Supine to sit: Min guard     General bed mobility comments: Noted pt falling to the Rt while in bed as she was trying to adjust bed sheets as PT walked into room; pt corrected balance with use of bed rails.    Transfers Overall transfer level: Needs assistance Equipment used: Rolling walker (2 wheeled) Transfers: Sit to/from Stand Sit to Stand: Min guard            Ambulation/Gait Ambulation/Gait assistance: Min guard;Min assist Ambulation Distance (Feet): 15 Feet Assistive device: Rolling walker (2 wheeled) Gait Pattern/deviations: Ataxic;Step-through pattern;Decreased step length - right;Decreased step length - left   Gait velocity interpretation: Below normal speed for age/gender General Gait Details: Noted ataxic gait pattern, with pt tendency to LOB to the Rt requiring min assist to correct.  Unknown if pt uses AD at home, as pt is poor historian, though old records state RW was recommended.  Pt does report she was a furniture walker.       Balance Overall balance assessment: Needs assistance Sitting-balance support: Feet supported;No upper extremity supported Sitting balance-Leahy Scale: Fair     Standing balance support: Bilateral upper extremity supported;During functional activity (On RW with min guard/min assist secondary to LOB Rt) Standing balance-Leahy Scale: Fair  Pertinent Vitals/Pain No pain reported.  No facial grimacing noted during gait.     Home Living                   Additional Comments: Unknown history as pt is poor historian.   Per old records, pt lives with spouse vs. mother and possibly has a friend who is able to assist as needed.  Friend and daughter were present in ED for admission.   ED records state she has a sister who lives nearby, per patient, they do not get along and she does not see her everyday.      Prior Function           Comments: Unknown as pt is poor historian, however pt reports she was (I) with functional mobility skills.      Hand Dominance        Extremity/Trunk Assessment               Lower Extremity Assessment: Generalized weakness;LLE deficits/detail;RLE deficits/detail RLE Deficits / Details: MMT informally assessed as pt had difficulty following commands, MMT hip 3+/5, knee/ankle 4/5 LLE Deficits / Details: MMT informally assessed as pt had difficulty following commands, MMT hip 3+/5, knee/ankle 4/5     Communication   Communication: Expressive difficulties (Per RN, exerpressive aphasia has improved since admission. )  Cognition Arousal/Alertness: Awake/alert Behavior During Therapy: WFL for tasks assessed/performed Overall Cognitive Status: No family/caregiver present to determine baseline cognitive functioning (Pt answered questions with appropriate responses (what she walked with,lived with-unknown if true), though answers incorrect/answers changed during eval (incorrect current year, as pt stated it was 34, and she lived with spouse vs. friend vs. mother))                               Assessment/Plan    PT Assessment Patient needs continued PT services  PT Diagnosis Difficulty walking;Generalized weakness   PT Problem List Decreased strength;Decreased activity tolerance;Decreased cognition;Decreased balance;Decreased safety awareness;Decreased knowledge of use of DME;Decreased mobility;Decreased coordination  PT Treatment Interventions DME instruction;Balance training;Gait training;Neuromuscular re-education;Functional mobility training;Therapeutic  activities;Therapeutic exercise   PT Goals (Current goals can be found in the Care Plan section) Acute Rehab PT Goals Patient Stated Goal: None stated PT Goal Formulation: With patient Time For Goal Achievement: 11/03/13 Potential to Achieve Goals: Good    Frequency Min 3X/week   Barriers to discharge Decreased caregiver support Unknown extent of caregiver support at home as pt is poor historian.        End of Session Equipment Utilized During Treatment: Gait belt Activity Tolerance: Patient tolerated treatment well Patient left: in bed;with call bell/phone within reach;with bed alarm set           Time: 0922-0938 PT Time Calculation (min): 16 min   Charges:   PT Evaluation $Initial PT Evaluation Tier I: 1 Procedure          Lashanna Angelo 10/20/2013, 12:04 PM

## 2013-10-20 NOTE — Progress Notes (Signed)
[facility] PROGRESS NOTE  [name] JHE:[id number] DOB: 31-May-1955 DOA: 10/19/2013 PCP: MUSE,ROCHELLE D., PA-C     Assessment/Plan: Ataxia/ dysphasia: Likely secondary to brain metastases with associated edema per CT. MRI of the brain yields innumerable metastatic lesions scattered throughout the cerebellum, brainstem and cerebral hemispheres, many associated with vasogenic edema. Improved expressive aphasia this am.  PT evaluation yields continued ataxia. Continue  Decadron 4 mg every 6 hours intravenously. Continue  Keppra 250 mg intravenously prophylactically as she is most likely at risk for seizure. Of note patient has had odynophagia during her chemotherapy and radiation. EGD done in may of this year reveals ODYNOPHAGIA DUE TO ESOPHAGEAL ULCER, NAUSEA AND VOMITING DUE TO ULCERATIVE ESOPHAGISTIS, MILD Erosive gastritis, AND MODERTAE Duodenitis. Will plan to transition to po decadron tomorrow.  Active Problems:  Brain metastasis; per CT as noted above. Patient with history of small cell lung cancer last chemotherapy and radiation in February and March respectively.  See above. Follow up with dr. Inda Merlin for treatment plan.   Acute on chronic renal failure: Likely related to decreased by mouth intake. Creatinine at baseline today. Monitor   DM: Uncontrolled. A1c 6.2. Decadron will cause increase in glucose. Continue SSI  Hyponatremia: resolved.    HYPERTENSION: Controlled. Will monitor closely   CAD: No chest pain. No events on telemetry.    COPD clinical dx: Stable at baseline   Small cell lung carcinoma: See #1.   Anemia of chronic disease: stable at baseline     Code Status: full Family Communication: none present Disposition Plan: home when read   Consultants:  none  Procedures:  none  Antibiotics:  none  HPI/Subjective: Awake denies pain/discomfort   Objective: Filed Vitals:   10/20/13 1415  BP: 118/71  Pulse: 93  Temp: 98.1 F (36.7 C)   Resp: 18    Intake/Output Summary (Last 24 hours) at 10/20/13 1455 Last data filed at 10/20/13 1338  Gross per 24 hour  Intake 1643.33 ml  Output   1000 ml  Net 643.33 ml   Filed Weights   10/19/13 1141 10/19/13 1738  Weight: 58.968 kg (130 lb) 62.279 kg (137 lb 4.8 oz)    Exam:   General:  Slightly pale, NAD  Cardiovascular: RRR no MGR No LE edema  Respiratory: normal effort BS clear bilaterally no wheeze  Abdomen: soft +BS non-tender to palpation  Musculoskeletal: no clubbing or cyanosis  Neuro: cranial nerve II-XII intact. Speech slow but clear and little word searching compared to yesterday. MAE.    Data Reviewed: Basic Metabolic Panel:  Recent Labs Lab 10/19/13 1138 10/19/13 1150 10/19/13 2143 10/20/13 0553  NA 134*  --  137 140  K 4.6  --  4.7 4.5  CL 92*  --  100 102  CO2 25  --  22 21  GLUCOSE 361*  --  145* 151*  BUN 21  --  20 19  CREATININE 1.85* DUPLICATE REQUEST 6.31* 1.20*  CALCIUM 9.9  --  9.3 9.2   Liver Function Tests: No results found for this basename: AST, ALT, ALKPHOS, BILITOT, PROT, ALBUMIN,  in the last 168 hours No results found for this basename: LIPASE, AMYLASE,  in the last 168 hours No results found for this basename: AMMONIA,  in the last 168 hours CBC:  Recent Labs Lab 10/19/13 1138 10/19/13 1150 10/20/13 4970  WBC 7.0 DUPLICATE REQUEST 4.6  HGB 11.2*  --  9.9*  HCT 32.1*  --  28.6*  MCV 92.0  --  91.4  PLT 392  --  302   Cardiac Enzymes: No results found for this basename: CKTOTAL, CKMB, CKMBINDEX, TROPONINI,  in the last 168 hours BNP (last 3 results) No results found for this basename: PROBNP,  in the last 8760 hours CBG:  Recent Labs Lab 10/19/13 1741 10/19/13 2202 10/20/13 0758 10/20/13 1115  GLUCAP 82 141* 155* 230*    No results found for this or any previous visit (from the past 240 hour(s)).   Studies: Ct Head Wo Contrast  10/19/2013   CLINICAL DATA:  Lung cancer.  Dizziness.  EXAM: CT HEAD  WITHOUT CONTRAST  TECHNIQUE: Contiguous axial images were obtained from the base of the skull through the vertex without intravenous contrast.  COMPARISON:  MRI 06/15/2013  FINDINGS: 1 cm hyperdense mass in the left medial thalamus with surrounding edema. 6 mm hyperdense lesion in the right medial parietal lobe with surrounding edema. There is edema in the left parietal lobe. There is edema in the frontal lobes bilaterally. 6 mm hyperdense lesion in the high left frontal lobe. Edema in the posterior temporal lobe bilaterally. These areas are all consistent with metastatic disease. No metastatic deposits were seen on the prior MRI.  Ventricle size is normal. No shift of the midline structures. No skull lesion identified.  IMPRESSION: Multiple brain lesions are present some of which may be slightly hemorrhagic. There is extensive edema around multiple lesions especially in the left thalamus and midbrain. Findings are consistent with multiple metastatic deposits. Further evaluation with MRI brain without and with contrast suggested.   Electronically Signed   By: Franchot Gallo M.D.   On: 10/19/2013 14:24   Mr Jeri Cos ZO Contrast  10/20/2013   CLINICAL DATA:  Lung cancer. Abnormal head CT. Weakness and syncope.  EXAM: MRI HEAD WITHOUT AND WITH CONTRAST  TECHNIQUE: Multiplanar, multiecho pulse sequences of the brain and surrounding structures were obtained without and with intravenous contrast.  CONTRAST:  24mL MULTIHANCE GADOBENATE DIMEGLUMINE 529 MG/ML IV SOLN  COMPARISON:  Head CT 10/19/2013.  MRI 06/15/2013.  FINDINGS: No present are innumerable metastatic lesions scattered throughout the cerebellum, brainstem and cerebral hemispheres, many associated with vasogenic edema. The single largest lesion is in the left midbrain/cerebral peduncle, measuring 11 mm in diameter. There is pronounced edema associated with this. The second largest lesion is at the left posterior frontal vertex measuring 9 mm in diameter. The  other lesions are all 7 mm and smaller. There is no midline shift. No hydrocephalus. No extra-axial fluid collection. Several of the lesions are associated with blood products, showing susceptibility artifact on gradient echo imaging. No visible calvarial metastases.  IMPRESSION: Development of innumerable metastatic lesions scattered throughout the cerebellum, brainstem and cerebral hemispheres. See above discussion. The largest lesion is in the left cerebral peduncle/midbrain, measuring 11 mm in diameter and associated with extensive vasogenic edema. Many of the other lesions are also associated with edema, but there is no midline shift. Micro hemorrhages also associated with many of the lesions.   Electronically Signed   By: Nelson Chimes M.D.   On: 10/20/2013 13:28    Scheduled Meds: . dexamethasone  4 mg Intravenous 4 times per day  . docusate sodium  100 mg Oral BID  . enoxaparin (LOVENOX) injection  40 mg Subcutaneous Q24H  . insulin aspart  0-20 Units Subcutaneous TID WC  . insulin aspart  0-5 Units Subcutaneous QHS  . levETIRAcetam  250 mg Intravenous Q12H  . sodium chloride  3 mL Intravenous  Q12H   Continuous Infusions: . sodium chloride 125 mL/hr at 10/20/13 0540    Principal Problem:   Ataxia Active Problems:   DM   HYPERTENSION   CAD   COPD clinical dx   Small cell lung carcinoma   Nausea with vomiting   CKD (chronic kidney disease) stage 3, GFR 30-59 ml/min   Anemia of chronic disease   Brain metastasis   Acute on chronic renal failure   Hyponatremia   Dysphasia   Lactic acid increased   Ulcerative esophagitis    Time spent: 30 minutes    [facility] Pager [phone]. If 7PM-7AM, please contact night-coverage at [email or web address], password [facility] 10/20/2013, 2:55 PM  LOS: 1 day

## 2013-10-20 NOTE — Progress Notes (Signed)
INITIAL NUTRITION ASSESSMENT  DOCUMENTATION CODES Per approved criteria  Severe malnutrition in the context of chronic illness   Patient meets criteria for severe MALNUTRITION related to chronic illness AEB 17% weight loss in the past 6 months and intake of <75% of needs for greater than 1 month.   INTERVENTION: Recommend Soft diet due to pt esophageal ulcer  Ensure Complete po TID, each supplement provides 350 kcal and 13 grams of protein   Consider appetite stimulant if appropriate with pt treatment goals  NUTRITION DIAGNOSIS: Inadequate oral itnake related to swallow difficulty, poor appetite as evidenced by     .   Goal: Pt to meet >/= 90% of their estimated nutrition needs     Monitor: Po intake, labs and wt trends    Reason for Assessment: Consult to assess nutrition status / requirements  58 y.o. female  Admitting Dx: Ataxia  ASSESSMENT:  Pt hx significant for small cell lung cancer. Followed at [facility] for [place] and radiation treatments with last chemo in February and and radiation in March of this year. She was diagnosed with moderate malnutrition during hospitalization 6/17 where she presented with hypotension and dehydration. Pt nutritional status is worsening. She has esophageal ulcer, mild erosive gastritis. Pt was able to drink her soup and eat pudding for lunch. She says she has no appetite or desire for food at all. She has additional significant, weight loss of 6#,4% in 2 weeks and 29#, 17% since December 2014.  She had MRI earlier today; results  brain metastasis. Will continue to follow for goals of care.    Nutrition Focused Physical Exam:  Subcutaneous Fat:  Orbital Region: mild wasting Upper Arm Region: WDL Thoracic and Lumbar Region: WDL  Muscle:  Temple Region: mild wasting Clavicle Bone Region: moderate wasting Clavicle and Acromion Bone Region: mild wasting Scapular Bone Region: not assessed Dorsal Hand: WDL Patellar Region: mild  wasting Anterior Thigh Region: mild wasting Posterior Calf Region: mild wasting  Edema: none    Height: Ht Readings from Last 1 Encounters:  10/19/13 5\' 6"  (1.676 m)    Weight: Wt Readings from Last 1 Encounters:  10/19/13 137 lb 4.8 oz (62.279 kg)    Ideal Body Weight: 130# (59 kg)  % Ideal Body Weight: 83%  Wt Readings from Last 10 Encounters:  10/19/13 137 lb 4.8 oz (62.279 kg)  10/05/13 142 lb 12.8 oz (64.774 kg)  09/29/13 139 lb 1.6 oz (63.095 kg)  09/02/13 144 lb (65.318 kg)  06/29/13 150 lb (68.04 kg)  05/31/13 151 lb 9.6 oz (68.765 kg)  05/21/13 152 lb 4.8 oz (69.083 kg)  05/10/13 157 lb 12.8 oz (71.578 kg)  03/22/13 165 lb 14.4 oz (75.252 kg)  03/01/13 162 lb 3.2 oz (73.573 kg)    Usual Body Weight: 160-165#  % Usual Body Weight: 83%  BMI:  Body mass index is 22.17 kg/(m^2). normal range  Estimated Nutritional Needs: Kcal: 1700-1860 Protein: 75-85 gr Fluid: >1800 ml daily  Skin: intact  Diet Order: General  EDUCATION NEEDS: -No education needs identified at this time   Intake/Output Summary (Last 24 hours) at 10/20/13 1503 Last data filed at 10/20/13 1338  Gross per 24 hour  Intake 1643.33 ml  Output   1000 ml  Net 643.33 ml    Last BM: 10/19/13  Labs:   Recent Labs Lab 10/19/13 1138 10/19/13 1150 10/19/13 2143 10/20/13 0553  NA 134*  --  137 140  K 4.6  --  4.7 4.5  CL 92*  --  100 102  CO2 25  --  22 21  BUN 21  --  20 19  CREATININE 5.91* DUPLICATE REQUEST 6.38* 1.20*  CALCIUM 9.9  --  9.3 9.2  GLUCOSE 361*  --  145* 151*    CBG (last 3)   Recent Labs  10/19/13 2202 10/20/13 0758 10/20/13 1115  GLUCAP 141* 155* 230*    Scheduled Meds: . dexamethasone  4 mg Intravenous 4 times per day  . docusate sodium  100 mg Oral BID  . enoxaparin (LOVENOX) injection  40 mg Subcutaneous Q24H  . feeding supplement (ENSURE COMPLETE)  237 mL Oral TID BM  . insulin aspart  0-20 Units Subcutaneous TID WC  . insulin aspart  0-5  Units Subcutaneous QHS  . levETIRAcetam  250 mg Intravenous Q12H  . sodium chloride  3 mL Intravenous Q12H    Continuous Infusions: . sodium chloride 125 mL/hr at 10/20/13 0540    Past Medical History  Diagnosis Date  . Tobacco abuse   . Diabetes mellitus   . Mitral regurgitation   . Hypertension   . Dyslipidemia   . Coronary artery disease   . History of echocardiogram 7/10    EF 50-55%; inferior hypokinesis; RV function mild decreased  . ACE-inhibitor cough   . Multiple allergies     Penicillin and mushrooms  . Cancer     limited stage small cell lung  . Ulcer     ulcer on stomach, throat, and trachea per pt on 10/05/13    Past Surgical History  Procedure Laterality Date  . Video bronchoscopy Bilateral 01/26/2013    Procedure: VIDEO BRONCHOSCOPY WITHOUT FLUORO;  Surgeon: Tanda Rockers, MD;  Location: [facility];  Service: Cardiopulmonary;  Laterality: Bilateral;  . Eye surgery    . Bilateral oophorectomy  2012    benign tumors  . Esophagogastroduodenoscopy N/A 09/07/2013    Procedure: ESOPHAGOGASTRODUODENOSCOPY (EGD);  Surgeon: Danie Binder, MD;  Location: [facility];  Service: Endoscopy;  Laterality: N/A;  9:30  . Maloney dilation N/A 09/07/2013    Procedure: Venia Minks DILATION;  Surgeon: Danie Binder, MD;  Location: [facility];  Service: Endoscopy;  Laterality: N/A;  . Savory dilation N/A 09/07/2013    Procedure: SAVORY DILATION;  Surgeon: Danie Binder, MD;  Location: [facility];  Service: Endoscopy;  Laterality: N/A;    Colman Cater MS,RD,CSG,LDN Office: [phone] Pager: [phone]

## 2013-10-20 NOTE — Plan of Care (Signed)
Pt and husband gave full consent for Valetta Fuller (sister) to be added to pt contact list.  Please share any and all info with this individual as needed or requested.

## 2013-10-20 NOTE — Evaluation (Signed)
Clinical/Bedside Swallow Evaluation Patient Details  Name: [name] MRN: [id number] Date of Birth: 1956/01/14  Today's Date: 10/20/2013 Time: 1245-1300 SLP Time Calculation (min): 15 min  Past Medical History:  Past Medical History  Diagnosis Date  . Tobacco abuse   . Diabetes mellitus   . Mitral regurgitation   . Hypertension   . Dyslipidemia   . Coronary artery disease   . History of echocardiogram 7/10    EF 50-55%; inferior hypokinesis; RV function mild decreased  . ACE-inhibitor cough   . Multiple allergies     Penicillin and mushrooms  . Cancer     limited stage small cell lung  . Ulcer     ulcer on stomach, throat, and trachea per pt on 10/05/13   Past Surgical History:  Past Surgical History  Procedure Laterality Date  . Video bronchoscopy Bilateral 01/26/2013    Procedure: VIDEO BRONCHOSCOPY WITHOUT FLUORO;  Surgeon: Tanda Rockers, MD;  Location: [facility];  Service: Cardiopulmonary;  Laterality: Bilateral;  . Eye surgery    . Bilateral oophorectomy  2012    benign tumors  . Esophagogastroduodenoscopy N/A 09/07/2013    Procedure: ESOPHAGOGASTRODUODENOSCOPY (EGD);  Surgeon: Danie Binder, MD;  Location: [facility];  Service: Endoscopy;  Laterality: N/A;  9:30  . Maloney dilation N/A 09/07/2013    Procedure: Venia Minks DILATION;  Surgeon: Danie Binder, MD;  Location: [facility];  Service: Endoscopy;  Laterality: N/A;  . Savory dilation N/A 09/07/2013    Procedure: SAVORY DILATION;  Surgeon: Danie Binder, MD;  Location: [facility];  Service: Endoscopy;  Laterality: N/A;   HPI:  [name] is a 58 y.o. female with a past medical history significant for small cell lung cancer last chemotherapy and radiation in February and March respectively, diabetes, hypertension, CAD, presents to the emergency department on 10/19/13 with the chief complaint of ataxia and difficulty expressing herself (per friend) over the last 3-4 days.  MRI on 10/20/13 reveals  development of innumerable metastatic lesions scattered throughout the cerebellum, brainstem and cerebral hemispheres  Pt denies any swallowing difficulty this date.  She only has upper dentures available for BSE.   Assessment / Plan / Recommendation Clinical Impression   Pt had one incidence of delayed cough with thin via straw, but this could be related to sedation prior to MRI attempt before SLP arrived; small sips of thin, puree and solids elicited no s/s of aspiration during BSE; decreased mastication with solids with slow but thorough mastication noted; swallow appeared timely with adequate laryngeal elevation via palpation noted; recommend ST f/u 1-2x for diet tolerance given pt diagnosis; recommend Dysphagia 3/thin via small sips; no straws    Aspiration Risk  Mild    Diet Recommendation Dysphagia 3 (Mechanical Soft);Thin liquid (no straws; small sips)   Liquid Administration via: Cup Medication Administration: Whole meds with liquid Supervision: Staff to assist with self feeding Compensations: Slow rate;Small sips/bites Postural Changes and/or Swallow Maneuvers: Seated upright 90 degrees;Upright 30-60 min after meal    Other  Recommendations Oral Care Recommendations: Oral care BID   Follow Up Recommendations  24 hour supervision/assistance    Frequency and Duration min 2x/week  1 week   Pertinent Vitals/Pain Afebrile; no pain per pt report     SLP Swallow Goals  See POC   Swallow Study Prior Functional Status   Dependent; 24 hr supervision at home    General Date of Onset: 10/19/13 HPI: [name] is a 58 y.o. female with  a past medical history significant for small cell lung cancer last chemotherapy and radiation in February and March respectively, diabetes, hypertension, CAD, presents to the emergency department on 10/19/13 with the chief complaint of ataxia and difficulty expressing herself (per friend) over the last 3-4 days.  MRI on 10/20/13 reveals development of  innumerable metastatic lesions scattered throughout the cerebellum, brainstem and cerebral hemispheres  Pt denies any swallowing difficulty this date.  She only has upper dentures available for BSE. Type of Study: Bedside swallow evaluation Previous Swallow Assessment: n/a Diet Prior to this Study: Thin liquids (full liquids) Temperature Spikes Noted: No Respiratory Status: Room air Behavior/Cognition: Cooperative;Lethargic (possible sedation prior to MRI attempt) Oral Cavity - Dentition: Dentures, top;Edentulous (edentulous bottom) Self-Feeding Abilities: Needs assist;Needs set up Patient Positioning: Upright in bed Baseline Vocal Quality: Low vocal intensity;Clear Volitional Cough: Strong Volitional Swallow: Unable to elicit    Oral/Motor/Sensory Function Overall Oral Motor/Sensory Function: Appears within functional limits for tasks assessed   Ice Chips Ice chips: Not tested   Thin Liquid Thin Liquid: Impaired Presentation: Cup;Straw Pharyngeal  Phase Impairments: Cough - Delayed (x1; could be related to sedation; okay without straws)    Nectar Thick Nectar Thick Liquid: Not tested   Honey Thick Honey Thick Liquid: Not tested   Puree Puree: Within functional limits Presentation: Spoon   Solid       Solid: Impaired Presentation: Spoon Oral Phase Impairments: Impaired mastication       [name],PAT, M.S., CCC-SLP 10/20/2013,4:09 PM

## 2013-10-20 NOTE — Progress Notes (Signed)
Patient seen and examined. Note reviewed.  The patient was admitted to the hospital with ataxia and dysarthria. She has known small cell lung cancer. Previous imaging of her brain in 06/2013 did not indicate any metastatic lesions per CT scan. This was repeated on her current admission which indicated some evidence of metastatic disease. MRI of the brain was done today which indicated innumerable metastatic lesions scattered throughout the cerebellum, brainstem and cerebral hemispheres. The patient was started on intravenous Decadron as well as Keppra for seizure prophylaxis. Clinically, she appears to be improving. Initially the plan was to set up outpatient radiation therapy for her metastatic brain lesions. The family is unsure if the patient would be willing to undergo/tolerate further radiation/chemotherapy. It may be reasonable to followup with her primary oncologist prior to pursuing any further treatments. The family is also concerned about the patient going home by herself. Physical therapy consultation has been requested to see if she is a candidate for placement.  Kayce Chismar

## 2013-10-20 NOTE — Clinical Social Work Psychosocial (Signed)
Clinical Social Work Department BRIEF PSYCHOSOCIAL ASSESSMENT 10/20/2013  Patient:  [name], [name]     Account Number:  [id number]     Admit date:  10/19/2013  Clinical Social Worker:  [name]  Date/Time:  10/20/2013 11:37 AM  Referred by:  RN  Date Referred:  10/20/2013 Referred for  SNF Placement   Other Referral:   Interview type:  Patient Other interview type:   husband- [name]    PSYCHOSOCIAL DATA Living Status:  HUSBAND Admitted from facility:   Level of care:   Primary support name:  [name] Primary support relationship to patient:  SPOUSE Degree of support available:   supportive    CURRENT CONCERNS Current Concerns  Post-Acute Placement   Other Concerns:    SOCIAL WORK ASSESSMENT / PLAN CSW spoke first with pt's husband on phone as it was reported to CSW that pt was somewhat confused. Pt came to ED due to dizziness, falls, and difficulty expressing herself.  Diagnosed with new brain metastases. Pt was diagnosed with lung cancer in September of last year per husband. She completed chemo and radiation early this year and was just following up with oncology for routine scans at this point. He reports he was called about the new diagnosis yesterday and was naturally very upset. CSW discussed feelings with him and provided support. Pt lives with her husband, but stays with her mother who lives next door during the day while husband is working. He works 7-3:30. Pt's mother is limited due to recent hip replacement. Pt has sometimes stayed with other family members as well. The couple's best support is a friend, [name] who helps whenever needed. She has been available to take pt to appointments during the day. PT evaluted pt and recommendation is for supervision for safety. CSW discussed this with husband, including possible ALF. He reports pt is somewhere in the [facility] application process. CSW called caseworker, [name] and left voicemail requesting return call. Husband  would like for pt to return home and is hopeful to make arrangements for someone to stay at their home with pt during his work hours. He would like to find new arrangement as he states pt's family smokes and he does not want her around that. CSW met with pt. Alert and oriented. Aware she is in hospital, states it is 34 and knows president. She provided dates for lung cancer diagnosis which were verified with husband. Pt reports [name] has been her best friend for over 30 years and that she has been very supportive. Pt's only confusion during assessment was that she believes she is in hospital due to "brain receptor problem." She is aware that she will need additional radiation and she does not understand why. NP notified and states that pt was told of diagnosis yesterday. CSW discussed d/c plan with her. She asked what husband felt was needed and when told that he would like pt to return home, she responded, "He is a good man." Pt has no desire to consider placement at this time and is agreeable to her husband working on someone staying with her while he works. CM and NP notified of above.   Assessment/plan status:  Referral to [employer] Other assessment/ plan:   Information/referral to community resources:   CM for home health    PATIENT'S/FAMILY'S RESPONSE TO PLAN OF CARE: PT not recommending SNF as CSW referral from RN indicated. Pt and husband plan for pt to return home with supervision and will follow up in [place] for radiation.  CSW will sign off, but can be reconsulted if needed.       [name], [place]

## 2013-10-20 NOTE — Care Management Utilization Note (Signed)
UR completed 

## 2013-10-21 ENCOUNTER — Telehealth: Payer: Self-pay | Admitting: *Deleted

## 2013-10-21 DIAGNOSIS — D638 Anemia in other chronic diseases classified elsewhere: Secondary | ICD-10-CM

## 2013-10-21 LAB — GLUCOSE, CAPILLARY
Glucose-Capillary: 186 mg/dL — ABNORMAL HIGH (ref 70–99)
Glucose-Capillary: 274 mg/dL — ABNORMAL HIGH (ref 70–99)
Glucose-Capillary: 119 mg/dL — ABNORMAL HIGH (ref 70–99)

## 2013-10-21 LAB — CBC
HCT: 27 % — ABNORMAL LOW (ref 36.0–46.0)
HEMOGLOBIN: 9.4 g/dL — AB (ref 12.0–15.0)
MCH: 31.9 pg (ref 26.0–34.0)
MCHC: 34.8 g/dL (ref 30.0–36.0)
MCV: 91.5 fL (ref 78.0–100.0)
Platelets: 247 10*3/uL (ref 150–400)
RBC: 2.95 MIL/uL — ABNORMAL LOW (ref 3.87–5.11)
RDW: 12.6 % (ref 11.5–15.5)
WBC: 7 10*3/uL (ref 4.0–10.5)

## 2013-10-21 LAB — BASIC METABOLIC PANEL
ANION GAP: 12 (ref 5–15)
BUN: 18 mg/dL (ref 6–23)
CALCIUM: 8.7 mg/dL (ref 8.4–10.5)
CO2: 22 meq/L (ref 19–32)
Chloride: 107 mEq/L (ref 96–112)
Creatinine, Ser: 1.08 mg/dL (ref 0.50–1.10)
GFR calc Af Amer: 64 mL/min — ABNORMAL LOW (ref >90)
GFR calc non Af Amer: 55 mL/min — ABNORMAL LOW (ref >90)
GLUCOSE: 203 mg/dL — AB (ref 70–99)
POTASSIUM: 4.1 meq/L (ref 3.7–5.3)
Sodium: 141 mEq/L (ref 137–147)

## 2013-10-21 MED ORDER — LEVETIRACETAM 500 MG PO TABS
250.0000 mg | ORAL_TABLET | Freq: Two times a day (BID) | ORAL | Status: DC
  Administered 2013-10-21: 250 mg via ORAL
  Filled 2013-10-21: qty 1

## 2013-10-21 MED ORDER — LEVETIRACETAM 500 MG PO TABS: 250.0000 mg | ORAL_TABLET | Freq: Two times a day (BID) | ORAL | Status: DC

## 2013-10-21 MED ORDER — LEVETIRACETAM ER 500 MG PO TB24: 500.0000 mg | ORAL_TABLET | Freq: Every day | ORAL | Status: DC

## 2013-10-21 MED ORDER — DEXAMETHASONE 4 MG PO TABS: 4.0000 mg | ORAL_TABLET | Freq: Four times a day (QID) | ORAL | Status: AC

## 2013-10-21 MED ORDER — DEXAMETHASONE 4 MG PO TABS: 4.0000 mg | ORAL_TABLET | Freq: Four times a day (QID) | ORAL | Status: DC

## 2013-10-21 MED ORDER — SULFAMETHOXAZOLE-TRIMETHOPRIM 800-160 MG PO TABS: 1.0000 | ORAL_TABLET | Freq: Two times a day (BID) | ORAL | Status: AC

## 2013-10-21 MED ORDER — LEVETIRACETAM 250 MG PO TABS: 250.0000 mg | ORAL_TABLET | Freq: Two times a day (BID) | ORAL | Status: AC

## 2013-10-21 MED ORDER — DEXAMETHASONE 4 MG PO TABS
4.0000 mg | ORAL_TABLET | Freq: Four times a day (QID) | ORAL | Status: DC
  Administered 2013-10-21 (×2): 4 mg via ORAL
  Filled 2013-10-21 (×6): qty 1

## 2013-10-21 NOTE — Progress Notes (Signed)
Speech Language Pathology Treatment: Dysphagia  Patient Details Name: [name] MRN: [id number] DOB: 1955-06-06 Today's Date: 10/21/2013 Time: 4090-5025 SLP Time Calculation (min): 15 min  Assessment / Plan / Recommendation Clinical Impression  Pt demonstrated no s/s of aspiration of thin liquids. Delayed cough x1 following cracker- suspect d/t history of esophageal issues. Educated pt on compensatory strategies for esophageal dysmotility- small bites/ sips, alternating food/ liquids, sitting upright 30-60 minutes after meal. Recommend continuing dysphagia 3 diet with thin liquids, intermittent supervision to cue for compensatory strategies listed above. Pt risk of aspiration is low. No additional acute needs noted- speech will sign off at this time. Please re-consult as needed.     HPI HPI: [name] is a 58 y.o. female with a past medical history significant for small cell lung cancer last chemotherapy and radiation in February and March respectively, diabetes, hypertension, CAD, presents to the emergency department on 10/19/13 with the chief complaint of ataxia and difficulty expressing herself (per friend) over the last 3-4 days.  MRI on 10/20/13 reveals development of innumerable metastatic lesions scattered throughout the cerebellum, brainstem and cerebral hemispheres  Pt denies any swallowing difficulty this date.  She only has upper dentures available for BSE.   Pertinent Vitals n/a  SLP Plan  All goals met    Recommendations Diet recommendations: Dysphagia 3 (mechanical soft);Thin liquid Liquids provided via: Cup Medication Administration: Whole meds with liquid Supervision: Staff to assist with self feeding Compensations: Slow rate;Small sips/bites Postural Changes and/or Swallow Maneuvers: Seated upright 90 degrees;Upright 30-60 min after meal              Oral Care Recommendations: Oral care BID Follow up Recommendations: 24 hour supervision/assistance Plan: All goals  met    GO     Kern Reap, MA, CCC-SLP 10/21/2013, 10:49 AM

## 2013-10-21 NOTE — Progress Notes (Signed)
IV removed. Discharge instructions reviewed with patient and husband. Taken out via wheelchair for discharge home.

## 2013-10-21 NOTE — Discharge Summary (Signed)
Physician Discharge Summary  Shalita Notte RXV:[id number] DOB: 1956/03/29 DOA: 10/19/2013  PCP: MUSE,ROCHELLE D., PA-C  Admit date: 10/19/2013 Discharge date: 10/21/2013  Time spent: 40 minutes  Recommendations for Outpatient Follow-up:  1. Follow up with radiation oncology in [place], Dr. Sondra Come on 10/25/13 to discuss radiation therapy and further prognosis   Discharge Diagnoses:  Principal Problem:   Ataxia Active Problems:   DM   HYPERTENSION   CAD   COPD clinical dx   Small cell lung carcinoma   Nausea with vomiting   CKD (chronic kidney disease) stage 3, GFR 30-59 ml/min   Anemia of chronic disease   Brain metastasis   Acute on chronic renal failure   Hyponatremia   Dysphasia   Lactic acid increased   Ulcerative esophagitis   Protein-calorie malnutrition, severe Possible UTI  Discharge Condition: stable  Diet recommendation: dysphagia 3 soft  Filed Weights   10/19/13 1141 10/19/13 1738  Weight: 58.968 kg (130 lb) 62.279 kg (137 lb 4.8 oz)    History of present illness: [name] is a 58 y.o. female with a past medical history significant for small cell lung cancer last chemotherapy and radiation in February and March respectively, diabetes, hypertension, CAD, presented to the emergency department on 10/19/13 with the chief complaint of ataxia and dysphasia over the prior 3-4 days. Initial workup revealed brain lesions with extensive edema.   Information obtained from the patient and her friend who was at the bedside and participates in her care. She reported that over the previous 3-4 days she developed dizziness with headache, unsteady gait with 1 recent fall where she did not hit her head or lose consciousness, and difficulty expressing herself. Associated symptoms included nausea with 2 episodes of nonbloody emesis, decreased by mouth intake and generalized weakness. She denied visual disturbances difficulty swallowing numbness or tingling of extremities. She denied  syncope or near-syncope. She denied fever chills cough chest pain palpitations abdominal pain dysuria hematuria. Her friend reported the morning of presentation when she got up the expressive aphasia was worse and the patient was requesting to come to the hospital.  Evaluation in the emergency department yielded basic metabolic panel significant for sodium of 134, chloride 92, creatinine 1.45, serum glucose 361. Complete blood count significant for hemoglobin of 11.2. Lactic acid is 3.12. Urinalysis with many squama cell, few bacteria, RBCs 3-6, WBCs 11-20, small leukocytes. CT of the head with multiple brain lesions with extensive edema. EKG with sinus tachycardia.  In the emergency department she was given 2 L of normal saline intravenously, 4 mg of Decadron intravenously, 10 units of NPH insulin intravenously, 250 mg of Keppra intravenously.  Upon my examination she was alert and oriented. She was able to follow commands. Max temp 99.4 heart rate 100 blood pressure stable at 138/79. She was nontoxic appearing and she was not hypoxic.    Hospital Course:  Ataxia/ dysphasia: Likely secondary to brain metastases with associated edema per CT. MRI of the brain yields innumerable metastatic lesions scattered throughout the cerebellum, brainstem and cerebral hemispheres, many associated with vasogenic edema. Provided with IV decadron and keppra for siezure prophylaxis. At dischage improved expressive aphasia. Continues with ataxia but somewhat better. Evaluated by  PT who recommend 24 hour supervision and family has arranged for this. Evaluated by speech therapy who recommend dysphagia 3 diet soft food.    Active Problems:  Brain metastasis; per CT and MRI  as noted above. Patient with history of small cell lung cancer last chemotherapy and  radiation in February and March respectively. See above. Follow up with Dr. Sondra Come for treatment plan. Radiation in [place] has been arranged per Dr Julien Nordmann.  Acute on  chronic renal failure: Likely related to decreased by mouth intake. Creatinine at baseline.   DM: Uncontrolled. A1c 6.2. Decadron will cause increase in glucose. Continue home metformin. SBG range 140-200.   Honatremia: resolved.  HYPERTENSION: Controlled. CAD: No chest pain. No events on telemetry.  COPD clinical dx: Stable at baseline  Small cell lung carcinoma: See #1.  Anemia of chronic disease: stable at baseline   Procedures:  none  Consultations:  none  Discharge Exam: Filed Vitals:   10/21/13 0531  BP: 98/53  Pulse: 75  Temp: 97.1 F (36.2 C)  Resp: 20    General: thin somewhat frail appearing NAD Cardiovascular: RRR No MGR no LE edema Respiratory: normal effort BS clear bilaterally Neuro: alert oriented x3. Speech slow and deliberate.   Discharge Instructions You were cared for by a hospitalist during your hospital stay. If you have any questions about your discharge medications or the care you received while you were in the hospital after you are discharged, you can call the unit and asked to speak with the hospitalist on call if the hospitalist that took care of you is not available. Once you are discharged, your primary care physician will handle any further medical issues. Please note that NO REFILLS for any discharge medications will be authorized once you are discharged, as it is imperative that you return to your primary care physician (or establish a relationship with a primary care physician if you do not have one) for your aftercare needs so that they can reassess your need for medications and monitor your lab values.     Medication List         dexamethasone 4 MG tablet  Commonly known as:  DECADRON  Take 1 tablet (4 mg total) by mouth every 6 (six) hours.     levETIRAcetam 250 MG tablet  Commonly known as:  KEPPRA  Take 1 tablet (250 mg total) by mouth 2 (two) times daily.     lovastatin 20 MG tablet  Commonly known as:  MEVACOR  Take 20 mg by  mouth at bedtime.     metFORMIN 850 MG tablet  Commonly known as:  GLUCOPHAGE  Take 850 mg by mouth 2 (two) times daily with a meal.     ondansetron 8 MG disintegrating tablet  Commonly known as:  ZOFRAN ODT  Take 1 tablet (8 mg total) by mouth every 8 (eight) hours as needed for nausea or vomiting.     SUPER B COMPLEX PO  Take 1 tablet by mouth daily.     vitamin B-12 1000 MCG tablet  Commonly known as:  CYANOCOBALAMIN  Take 1,000 mcg by mouth daily.       Allergies  Allergen Reactions  . Aspirin     Has an ulcer, MD told her not to take anymore  . Codeine Nausea And Vomiting  . Mushroom Extract Complex     Swelling of eyes and face  . Penicillins Other (See Comments)    Childhood allergy      The results of significant diagnostics from this hospitalization (including imaging, microbiology, ancillary and laboratory) are listed below for reference.    Significant Diagnostic Studies: Ct Abdomen Pelvis Wo Contrast  09/28/2013   CLINICAL DATA:  Lung cancer diagnosed 9/14. Chemotherapy and radiation therapy complete. Hysterectomy. Nausea and vomiting. Small cell  lung cancer.  EXAM: CT CHEST, ABDOMEN AND PELVIS WITHOUT CONTRAST  TECHNIQUE: Multidetector CT imaging of the chest, abdomen and pelvis was performed following the standard protocol without IV contrast.  COMPARISON:  Chest CT 06/28/2013. PET of 02/05/2013. Most recent abdominal pelvic CT of 01/21/2013. Most recent clinic note of 06/29/2012.  FINDINGS: CT CHEST FINDINGS  Lungs/Pleura: Moderate centrilobular emphysema. Subpleural spiculated right upper lobe lung nodule. 1.3 x 1.0 cm on image 14 versus 1.4 x 1.3 cm on the prior exam.  Increased peribronchovascular nodular/interstitial opacity. Example image 25/series 4. Similar right middle lobe micro nodularity.  Similar to slight increase in the left-sided interstitial thickening.  Partial collapse of the medial right middle lobe is similar. Compression or collapse of the  subtending bronchus.  No pleural fluid.  Heart/Mediastinum: A right low jugular/supraclavicular node measures 1.2 x 1.2 cm on image 6, new since the prior exam. Measured 1.0 cm on the 02/05/2013 PET.  No axillary adenopathy. Aortic and branch vessel atherosclerosis. Mild pulmonary artery enlargement, with the outflow tract measuring 3.1 cm. Multivessel coronary artery atherosclerosis. Normal heart size, without pericardial effusion.  Extensive calcified mediastinal and right hilar lymph nodes, consistent with old granulomatous disease. A noncalcified right paratracheal node measures 8 mm on image 19 versus 1.3 cm on the prior. Hilar regions poorly evaluated without intravenous contrast. Index prevascular node measures 7 mm on image 24, decreased from 9 mm on the prior.  CT ABDOMEN AND PELVIS FINDINGS  Abdomen/Pelvis: Scattered low-density subcentimeter liver lesions which are similar to on the prior exam and likely cysts. Decrease sensitivity for new hepatic lesions secondary to lack of IV contrast.  Normal noncontrast appearance of the spleen, stomach. Pancreatic atrophy. Normal gallbladder, biliary tract, right adrenal gland. Mild left adrenal thickening which is unchanged.  Mild bilateral renal cortical thinning. Punctate left renal collecting system stone. Age advanced aortic and branch vessel atherosclerosis. No retroperitoneal or retrocrural adenopathy. Normal colon and terminal ileum. Normal small bowel without abdominal ascites. No pelvic adenopathy. Normal urinary bladder and uterus, without adnexal mass or significant free pelvic fluid.  Bones/Musculoskeletal: Moderate osteopenia. No acute osseous abnormality.  IMPRESSION: CT CHEST IMPRESSION  1. Decreased size of right upper lobe lung nodule and thoracic lymph nodes, suggesting response to disease within the chest. 2. Low right cervical adenopathy which is new since 06/28/2013 and progressive since 02/05/2013. This is most consistent with disease  progression. 3. Centrilobular emphysema with progressive reticular nodular opacities, primarily on the right. Correlate with infectious symptoms, as acute on chronic atypical infection could have this appearance. Differential considerations include drug toxicity. 4. Age advanced coronary artery atherosclerosis. Recommend assessment of coronary risk factors and consideration of medical therapy. 5. Pulmonary artery enlargement suggests pulmonary arterial hypertension.  CT ABDOMEN AND PELVIS IMPRESSION  1. No acute process or evidence of metastatic disease in the abdomen or pelvis. 2. Age advanced atherosclerosis. 3. Left nephrolithiasis.   Electronically Signed   By: Abigail Miyamoto M.D.   On: 09/28/2013 14:30   Dg Chest 2 View  10/03/2013   CLINICAL DATA:  Weakness, nausea  EXAM: CHEST  2 VIEW  COMPARISON:  05/20/2013 and 09/28/2013  FINDINGS: Cardiomediastinal silhouette is stable. Again noted spiculated nodule in right upper lobe measures 1.3 cm. No acute infiltrate or pleural effusion. No pulmonary edema. Bony thorax is stable.  IMPRESSION: No active disease. Again noted spiculated nodule in right upper lobe measures 1.3 cm.   Electronically Signed   By: Lahoma Crocker M.D.   On: 10/03/2013  12:07   Ct Head Wo Contrast  10/19/2013   CLINICAL DATA:  Lung cancer.  Dizziness.  EXAM: CT HEAD WITHOUT CONTRAST  TECHNIQUE: Contiguous axial images were obtained from the base of the skull through the vertex without intravenous contrast.  COMPARISON:  MRI 06/15/2013  FINDINGS: 1 cm hyperdense mass in the left medial thalamus with surrounding edema. 6 mm hyperdense lesion in the right medial parietal lobe with surrounding edema. There is edema in the left parietal lobe. There is edema in the frontal lobes bilaterally. 6 mm hyperdense lesion in the high left frontal lobe. Edema in the posterior temporal lobe bilaterally. These areas are all consistent with metastatic disease. No metastatic deposits were seen on the prior MRI.   Ventricle size is normal. No shift of the midline structures. No skull lesion identified.  IMPRESSION: Multiple brain lesions are present some of which may be slightly hemorrhagic. There is extensive edema around multiple lesions especially in the left thalamus and midbrain. Findings are consistent with multiple metastatic deposits. Further evaluation with MRI brain without and with contrast suggested.   Electronically Signed   By: Franchot Gallo M.D.   On: 10/19/2013 14:24   Ct Chest Wo Contrast  09/28/2013   CLINICAL DATA:  Lung cancer diagnosed 9/14. Chemotherapy and radiation therapy complete. Hysterectomy. Nausea and vomiting. Small cell lung cancer.  EXAM: CT CHEST, ABDOMEN AND PELVIS WITHOUT CONTRAST  TECHNIQUE: Multidetector CT imaging of the chest, abdomen and pelvis was performed following the standard protocol without IV contrast.  COMPARISON:  Chest CT 06/28/2013. PET of 02/05/2013. Most recent abdominal pelvic CT of 01/21/2013. Most recent clinic note of 06/29/2012.  FINDINGS: CT CHEST FINDINGS  Lungs/Pleura: Moderate centrilobular emphysema. Subpleural spiculated right upper lobe lung nodule. 1.3 x 1.0 cm on image 14 versus 1.4 x 1.3 cm on the prior exam.  Increased peribronchovascular nodular/interstitial opacity. Example image 25/series 4. Similar right middle lobe micro nodularity.  Similar to slight increase in the left-sided interstitial thickening.  Partial collapse of the medial right middle lobe is similar. Compression or collapse of the subtending bronchus.  No pleural fluid.  Heart/Mediastinum: A right low jugular/supraclavicular node measures 1.2 x 1.2 cm on image 6, new since the prior exam. Measured 1.0 cm on the 02/05/2013 PET.  No axillary adenopathy. Aortic and branch vessel atherosclerosis. Mild pulmonary artery enlargement, with the outflow tract measuring 3.1 cm. Multivessel coronary artery atherosclerosis. Normal heart size, without pericardial effusion.  Extensive calcified  mediastinal and right hilar lymph nodes, consistent with old granulomatous disease. A noncalcified right paratracheal node measures 8 mm on image 19 versus 1.3 cm on the prior. Hilar regions poorly evaluated without intravenous contrast. Index prevascular node measures 7 mm on image 24, decreased from 9 mm on the prior.  CT ABDOMEN AND PELVIS FINDINGS  Abdomen/Pelvis: Scattered low-density subcentimeter liver lesions which are similar to on the prior exam and likely cysts. Decrease sensitivity for new hepatic lesions secondary to lack of IV contrast.  Normal noncontrast appearance of the spleen, stomach. Pancreatic atrophy. Normal gallbladder, biliary tract, right adrenal gland. Mild left adrenal thickening which is unchanged.  Mild bilateral renal cortical thinning. Punctate left renal collecting system stone. Age advanced aortic and branch vessel atherosclerosis. No retroperitoneal or retrocrural adenopathy. Normal colon and terminal ileum. Normal small bowel without abdominal ascites. No pelvic adenopathy. Normal urinary bladder and uterus, without adnexal mass or significant free pelvic fluid.  Bones/Musculoskeletal: Moderate osteopenia. No acute osseous abnormality.  IMPRESSION: CT CHEST IMPRESSION  1. Decreased size of right upper lobe lung nodule and thoracic lymph nodes, suggesting response to disease within the chest. 2. Low right cervical adenopathy which is new since 06/28/2013 and progressive since 02/05/2013. This is most consistent with disease progression. 3. Centrilobular emphysema with progressive reticular nodular opacities, primarily on the right. Correlate with infectious symptoms, as acute on chronic atypical infection could have this appearance. Differential considerations include drug toxicity. 4. Age advanced coronary artery atherosclerosis. Recommend assessment of coronary risk factors and consideration of medical therapy. 5. Pulmonary artery enlargement suggests pulmonary arterial  hypertension.  CT ABDOMEN AND PELVIS IMPRESSION  1. No acute process or evidence of metastatic disease in the abdomen or pelvis. 2. Age advanced atherosclerosis. 3. Left nephrolithiasis.   Electronically Signed   By: Abigail Miyamoto M.D.   On: 09/28/2013 14:30   Mr Jeri Cos HE Contrast  10/20/2013   CLINICAL DATA:  Lung cancer. Abnormal head CT. Weakness and syncope.  EXAM: MRI HEAD WITHOUT AND WITH CONTRAST  TECHNIQUE: Multiplanar, multiecho pulse sequences of the brain and surrounding structures were obtained without and with intravenous contrast.  CONTRAST:  54mL MULTIHANCE GADOBENATE DIMEGLUMINE 529 MG/ML IV SOLN  COMPARISON:  Head CT 10/19/2013.  MRI 06/15/2013.  FINDINGS: No present are innumerable metastatic lesions scattered throughout the cerebellum, brainstem and cerebral hemispheres, many associated with vasogenic edema. The single largest lesion is in the left midbrain/cerebral peduncle, measuring 11 mm in diameter. There is pronounced edema associated with this. The second largest lesion is at the left posterior frontal vertex measuring 9 mm in diameter. The other lesions are all 7 mm and smaller. There is no midline shift. No hydrocephalus. No extra-axial fluid collection. Several of the lesions are associated with blood products, showing susceptibility artifact on gradient echo imaging. No visible calvarial metastases.  IMPRESSION: Development of innumerable metastatic lesions scattered throughout the cerebellum, brainstem and cerebral hemispheres. See above discussion. The largest lesion is in the left cerebral peduncle/midbrain, measuring 11 mm in diameter and associated with extensive vasogenic edema. Many of the other lesions are also associated with edema, but there is no midline shift. Micro hemorrhages also associated with many of the lesions.   Electronically Signed   By: Nelson Chimes M.D.   On: 10/20/2013 13:28    Microbiology: No results found for this or any previous visit (from the past  240 hour(s)).   Labs: Basic Metabolic Panel:  Recent Labs Lab 10/19/13 1138 10/19/13 1150 10/19/13 2143 10/20/13 0553 10/21/13 0548  NA 134*  --  137 140 141  K 4.6  --  4.7 4.5 4.1  CL 92*  --  100 102 107  CO2 25  --  22 21 22   GLUCOSE 361*  --  145* 151* 203*  BUN 21  --  20 19 18   CREATININE 5.27* DUPLICATE REQUEST 7.82* 1.20* 1.08  CALCIUM 9.9  --  9.3 9.2 8.7   Liver Function Tests: No results found for this basename: AST, ALT, ALKPHOS, BILITOT, PROT, ALBUMIN,  in the last 168 hours No results found for this basename: LIPASE, AMYLASE,  in the last 168 hours No results found for this basename: AMMONIA,  in the last 168 hours CBC:  Recent Labs Lab 10/19/13 1138 10/19/13 1150 10/20/13 0553 10/21/13 4235  WBC 7.0 DUPLICATE REQUEST 4.6 7.0  HGB 11.2*  --  9.9* 9.4*  HCT 32.1*  --  28.6* 27.0*  MCV 92.0  --  91.4 91.5  PLT 392  --  302  247   Cardiac Enzymes: No results found for this basename: CKTOTAL, CKMB, CKMBINDEX, TROPONINI,  in the last 168 hours BNP: BNP (last 3 results) No results found for this basename: PROBNP,  in the last 8760 hours CBG:  Recent Labs Lab 10/20/13 1115 10/20/13 1721 10/20/13 2134 10/21/13 0804 10/21/13 1131  GLUCAP 230* 199* 169* 186* 274*       Signed:  BLACK,KAREN M  [facility] 10/21/2013, 11:49 AM   Attending note:  Patient seen and examined. Above note reviewed.  This is an unfortunate 58 year old lady with history of small cell lung cancer status post recent radiation and chemotherapy. She previously had an MRI of her brain on 2/24 which did not show any evidence of metastatic disease. On arrival to the emergency room, CT scan of the head was done which indicated multiple brain lesions with extensive edema around multiple lesions. This was followed up with MRI brain which indicated innumerable metastatic lesions scattered throughout the cerebellum, brainstem and cerebral hemisphere. Case was discussed with  Dr. Julien Nordmann who is the patient's primary oncologist. He has made arrangements for patient to receive radiation therapy to her brain in [place] and on 7/6 with Dr. Sondra Come. He has recommended that the family discussed the option of pursuing radiation treatment versus hospice care with Dr. Sondra Come. Per Dr. Julien Nordmann, who the patient's advanced malignancy, it would be very reasonable to pursue hospice care. Patient was started on dexamethasone as well as Keppra. She did have some improvement in her ataxia and mental status during hospital stay. She'll be discharged home to the care of her family. Her overall prognosis appears to be poor.  Mathews Stuhr

## 2013-10-21 NOTE — Care Management Note (Signed)
    Page 1 of 1   10/21/2013     4:33:03 PM CARE MANAGEMENT NOTE 10/21/2013  Patient:  BRIGHID, KOCH   Account Number:  [id number]  Date Initiated:  10/21/2013  Documentation initiated by:  Vladimir Creeks  Subjective/Objective Assessment:   Admitted with AMS, and found to have metastatic brain CA, from known lung CA. Pt lives at home with spouse, and will be returning home at D/C     Action/Plan:   Pt will benefit form HH to follow. Since PT is recommending 24h supervision, and pt is home alone while spouse work, he is going to get a friend to come sit with her while he works.   Anticipated DC Date:  10/21/2013   Anticipated DC Plan:  [place]  CM consult  [facility]      [facility] Choice  HOME HEALTH   Choice offered to / List presented to:  C-1 Patient        [place] arranged  HH-1 RN  [place]      [place].   Status of service:  Completed, signed off Medicare Important Message given?   (If response is "NO", the following Medicare IM given date fields will be blank) Date Medicare IM given:   Medicare IM given by:   Date Additional Medicare IM given:   Additional Medicare IM given by:    Discharge Disposition:  [place]  Per UR Regulation:    If discussed at Long Length of Stay Meetings, dates discussed:    Comments:  10/21/13 1200 Vladimir Creeks RN/CM Pt also assisted with the [facility] medication assistance, since she is a self pay, lost Medicaid at present, and only spouse working

## 2013-10-21 NOTE — Progress Notes (Signed)
Inpatient Diabetes Program Recommendations  AACE/ADA: New Consensus Statement on Inpatient Glycemic Control (2013)  Target Ranges:  Prepandial:   less than 140 mg/dL      Peak postprandial:   less than 180 mg/dL (1-2 hours)      Critically ill patients:  140 - 180 mg/dL  Results for MARIAHA, ELLINGTON (MRN [id number]) as of 10/21/2013 09:54  Ref. Range 10/20/2013 11:15 10/20/2013 17:21 10/20/2013 21:34 10/21/2013 08:04  Glucose-Capillary Latest Range: 70-99 mg/dL 230 (H) 199 (H) 169 (H) 186 (H)  Reason for Assessment: elevated CBG  Diabetes history:Type 2 Outpatient Diabetes medications: Glucophage 850mg  bid Current orders for Inpatient glycemic control: Correction bolus 0-20units Novolog with meals, 5 units Novolog QHS   Note: Currently on Solumedrol causing elevated CBG.  If BG remain> 180mg /dl may want to consider low dose basal insulin (Levemir 12 units - 62kg x 0.20)  Gentry Fitz, RN, [place], [place], CDE Diabetes Coordinator Inpatient Diabetes Program  [phone] (Team Pager) [phone] [facility]) 10/21/2013 9:56 AM

## 2013-10-21 NOTE — Discharge Instructions (Signed)
Metastatic Brain Tumor A brain tumor is a growth in the brain. These tumors may be classified as primary or secondary. A primary brain tumor starts out in the cells of the brain. A secondary brain tumor means the tumor started elsewhere in the body and then traveled to the brain through the bloodstream. This spread of cancer is called metastasis.  Brain metastases outnumber primary tumors by at least 10 to 1. And they occur in 20% to 40% of cancer patients. About one quarter of lung cancer patients are discovered because their cancer spreads to the brain, before the patient knows there are problems in the lungs. The exact incidence of cancer spread to the brain is unknown. But it has been estimated that more new cases are diagnosed each year. This number may be increasing due to:  Capacity of MRI (magnetic resonance imaging) scans to detect small metastases.  Prolonged cancer survival, resulting from improved therapy. 80% of brain metastases occur in the cerebral hemispheres. 15% occur in the cerebellum. 5% occur in the brain stem. In more than 70% of cases, there are multiple metastases to the brain. But solitary metastases also occur.  Brain tumors can develop directly from cancers that exist between the nasal passages and throat (nasopharyngeal region) by spreading along the cranial nerves or though the opening at the base of the skull (foramen magnum). Metastases in the thick covering over the brain surface (dura) may count for up to 9% of total central nervous system (CNS) metastases. Even if a patient has had a previous cancer, a lesion in the brain should not be assumed to be a metastasis. This could result in overlooking appropriate treatment of a curable tumor. Brain tumors that start in the brain (primary) rarely spread to other areas of the body. But they can spread to other parts of the brain and to the spinal cord (axis). The most common primary cancers that cause a metastasis in the brain  are:  Lung cancer (50%).  Unknown primary cancer (10% to 15%).  Colon cancer (5%).  Breast cancer (15% to 20%).  Skin cancer (melanoma) (10%). SYMPTOMS  A patient with a brain tumor may experience:  Headaches.  Weakness.  Seizures.  Sensory problems.  Problems with walking.  Tiredness.  Emotional changes.  Personality changes. Family and friends are often the first to notice emotional and personality changes. DIAGNOSIS  The diagnosis of brain metastases in cancer patients is based on:  Patient history.  A nervous system function test (neurological exam).  Specialized X-rays.  A brain tissue sample (biopsy). This is usually a stereotactic biopsy, which uses a precisely directed, delicate instrument. Biopsy may be needed when there is a:  Solitary lesion.  Questionable relationship to a primary tumor, if one is present elsewhere in the body. TREATMENT  Your treatment will depend on what the problem is and where it came from. Sometimes, removal of the tumor will tell you where it started. It may be that the primary tumor can also be removed. Chemotherapy or radiation treatments are sometimes required. Your caregiver will discuss treatment options with you. Document Released: 01/03/2004 Document Revised: 07/01/2011 Document Reviewed: 04/05/2008 [facility] Patient Information 2015 [place], [place]. This information is not intended to replace advice given to you by your health care provider. Make sure you discuss any questions you have with your health care provider.

## 2013-10-21 NOTE — Telephone Encounter (Signed)
Pt's husband called wanting to see when she needs to come and see Dr Julien Nordmann.  When she was discharged they told him that she would need to see Dr Vista Mink to discuss the plan with radiation.  Per dr Vista Mink, pt does not need to see Dr Vista Mink at this time, she needs to be seen by radiation oncology and if he does need to see her sooner he will.  Informed pt's husband.  Called radiation oncology in [place].  They are going to call pt's husband with an appointment.  They requested a hospital note from pt's hospitalization.  D/c summary dated 10/21/13 faxed to [phone].  SLJ

## 2013-10-23 LAB — URINE CULTURE

## 2013-10-25 ENCOUNTER — Telehealth: Payer: Self-pay | Admitting: Medical Oncology

## 2013-10-25 NOTE — Telephone Encounter (Signed)
Faxed records -[name] will be attending.

## 2013-11-20 DEATH — deceased

## 2013-12-22 ENCOUNTER — Ambulatory Visit (HOSPITAL_COMMUNITY): Payer: Medicaid Other

## 2013-12-22 ENCOUNTER — Other Ambulatory Visit: Payer: Self-pay

## 2013-12-24 ENCOUNTER — Telehealth: Payer: Self-pay | Admitting: *Deleted

## 2013-12-24 NOTE — Telephone Encounter (Signed)
Received notice from radiology that pt no showed for CT scan on 12/22/13.  Attempted to call pt, no answer, left vm.  F/u currently scheduled for 12/29/13.  SLJ

## 2013-12-28 ENCOUNTER — Telehealth: Payer: Self-pay | Admitting: Medical Oncology

## 2013-12-28 NOTE — Telephone Encounter (Signed)
Pt died Nov 15, 2022.

## 2013-12-29 ENCOUNTER — Ambulatory Visit: Payer: Self-pay | Admitting: Internal Medicine

## 2015-02-16 ENCOUNTER — Ambulatory Visit (HOSPITAL_COMMUNITY): Payer: Medicaid Other

## 2015-02-22 ENCOUNTER — Ambulatory Visit (HOSPITAL_COMMUNITY): Payer: Medicaid Other

## 2015-02-24 ENCOUNTER — Ambulatory Visit (HOSPITAL_COMMUNITY): Payer: Medicaid Other

## 2015-02-27 ENCOUNTER — Ambulatory Visit (HOSPITAL_COMMUNITY): Payer: Medicaid Other

## 2015-03-01 ENCOUNTER — Ambulatory Visit (HOSPITAL_COMMUNITY): Payer: Medicaid Other

## 2015-03-02 ENCOUNTER — Ambulatory Visit (HOSPITAL_COMMUNITY): Payer: Medicaid Other

## 2015-03-03 ENCOUNTER — Ambulatory Visit (HOSPITAL_COMMUNITY): Payer: Medicaid Other

## 2015-03-06 ENCOUNTER — Ambulatory Visit (HOSPITAL_COMMUNITY): Payer: Medicaid Other

## 2015-03-08 ENCOUNTER — Ambulatory Visit (HOSPITAL_COMMUNITY): Payer: Medicaid Other

## 2015-03-10 ENCOUNTER — Ambulatory Visit (HOSPITAL_COMMUNITY): Payer: Medicaid Other

## 2015-03-13 ENCOUNTER — Ambulatory Visit (HOSPITAL_COMMUNITY): Payer: Medicaid Other

## 2015-03-15 ENCOUNTER — Ambulatory Visit (HOSPITAL_COMMUNITY): Payer: Medicaid Other

## 2015-03-20 ENCOUNTER — Ambulatory Visit (HOSPITAL_COMMUNITY): Payer: Medicaid Other

## 2015-03-22 ENCOUNTER — Ambulatory Visit (HOSPITAL_COMMUNITY): Payer: Medicaid Other

## 2015-03-24 ENCOUNTER — Ambulatory Visit (HOSPITAL_COMMUNITY): Payer: Medicaid Other

## 2015-03-27 ENCOUNTER — Ambulatory Visit (HOSPITAL_COMMUNITY): Payer: Medicaid Other

## 2015-03-29 ENCOUNTER — Ambulatory Visit (HOSPITAL_COMMUNITY): Payer: Medicaid Other

## 2015-03-31 ENCOUNTER — Ambulatory Visit (HOSPITAL_COMMUNITY): Payer: Medicaid Other

## 2015-04-03 ENCOUNTER — Ambulatory Visit (HOSPITAL_COMMUNITY): Payer: Medicaid Other

## 2015-04-05 ENCOUNTER — Ambulatory Visit (HOSPITAL_COMMUNITY): Payer: Medicaid Other

## 2015-04-07 ENCOUNTER — Ambulatory Visit (HOSPITAL_COMMUNITY): Payer: Medicaid Other

## 2015-04-10 ENCOUNTER — Ambulatory Visit (HOSPITAL_COMMUNITY): Payer: Medicaid Other

## 2015-04-12 ENCOUNTER — Ambulatory Visit (HOSPITAL_COMMUNITY): Payer: Medicaid Other

## 2015-04-14 ENCOUNTER — Ambulatory Visit (HOSPITAL_COMMUNITY): Payer: Medicaid Other

## 2015-04-19 ENCOUNTER — Ambulatory Visit (HOSPITAL_COMMUNITY): Payer: Medicaid Other

## 2015-04-21 ENCOUNTER — Ambulatory Visit (HOSPITAL_COMMUNITY): Payer: Medicaid Other

## 2015-04-24 ENCOUNTER — Ambulatory Visit (HOSPITAL_COMMUNITY): Payer: Medicaid Other

## 2015-04-25 IMAGING — CT NM PET TUM IMG INITIAL (PI) SKULL BASE T - THIGH
6 series · 25 of 25 positions shown · IV contrast (BARUIM SULFATE)
Comparison: Multiple exams, including 01/21/2013 and 01/12/2013

CLINICAL DATA: Initial treatment strategy for poorly differentiated
neuroendocrine carcinoma/small cell carcinoma of the lung..

EXAM:
NUCLEAR MEDICINE PET SKULL BASE TO THIGH
FASTING BLOOD GLUCOSE:  Value:  133 mg/dl
TECHNIQUE: 19.9 mCi F-18 FDG was injected intravenously. CT data was obtained
and used for attenuation correction and anatomic localization only.
(This was not acquired as a diagnostic CT examination.) Additional
exam technical data entered on technologist worksheet.

[Series 1: pet ac · axial · 3.3mm · 4.69mm/px · z∈[-914,-44]mm · 5 of 267 slices shown]
[im 1/267]
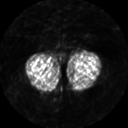
[im 67/267]
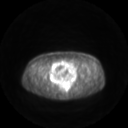
[im 134/267]
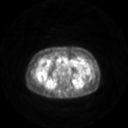
[im 200/267]
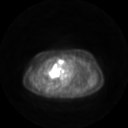
[im 267/267]
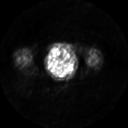

[Series 2: pet nac · axial · 3.3mm · 4.69mm/px · z∈[-914,-44]mm · 6 of 267 slices shown]
[im 1/267]
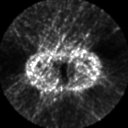
[im 54/267]
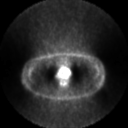
[im 107/267]
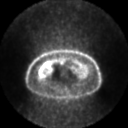
[im 160/267]
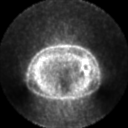
[im 213/267]
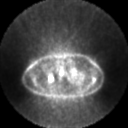
[im 267/267]
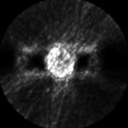

[Series 2: ct images · axial · 3.8mm · 0.98mm/px · z∈[-914,-44]mm · 5 of 267 slices shown]
[im 1/267]
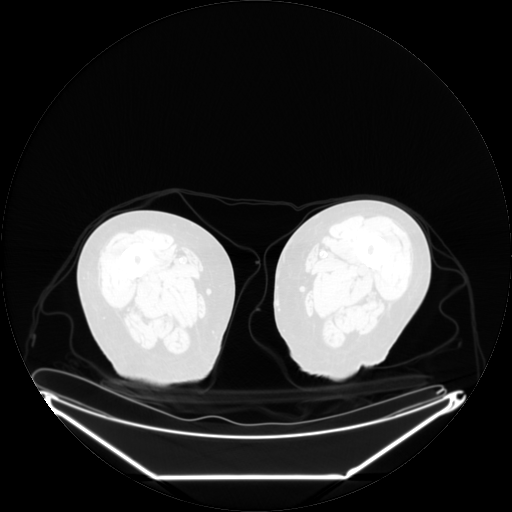
[im 67/267]
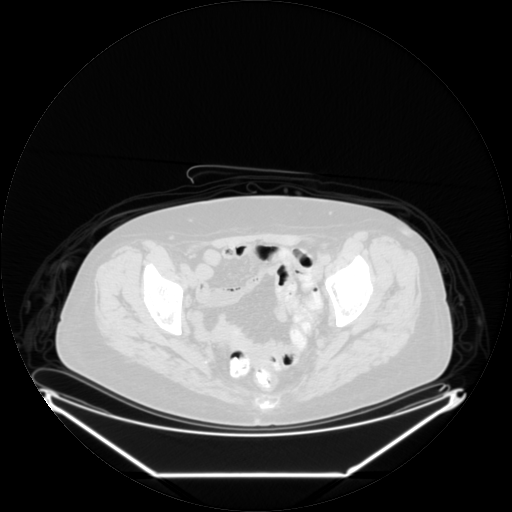
[im 134/267]
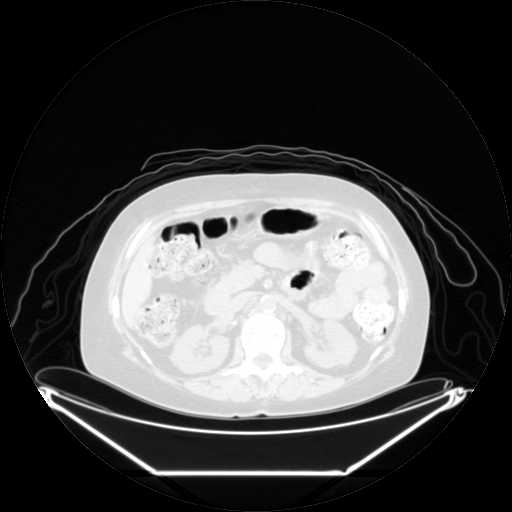
[im 200/267]
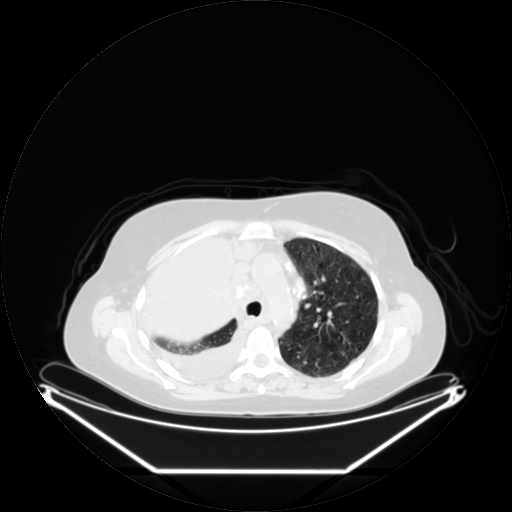
[im 267/267  brain]
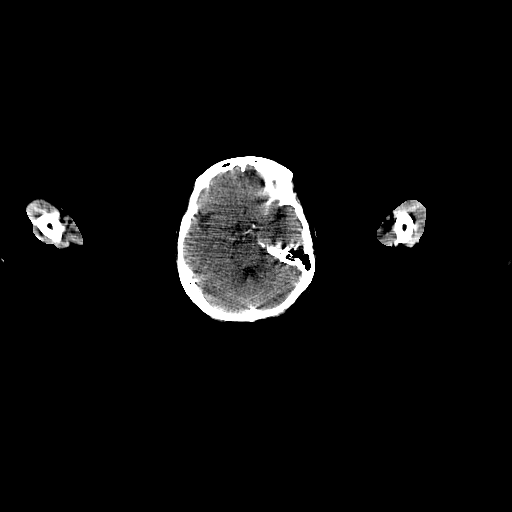

[Series 123: mip · coronal · 3.3mm · 4.69mm/px · 1 of 30 slices shown]
[im 1/30]
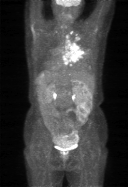

[Series 151: reformatted · axial · 3.3mm · 3.91mm/px · z∈[-914,-44]mm · 6 of 267 slices shown (1 of 2)]
[im 1/267]
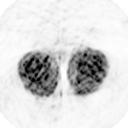
[im 54/267]
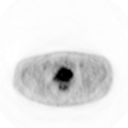
[im 107/267]
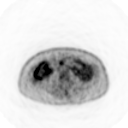
[im 160/267]
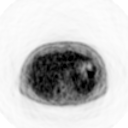
[im 213/267]
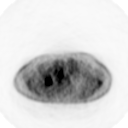
[im 267/267]
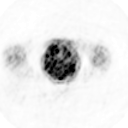

[Series 153: reformatted · coronal · 4.7mm · 6.98mm/px · 2 of 73 slices shown (2 of 2)]
[im 1/73]
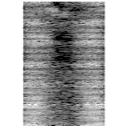
[im 73/73]
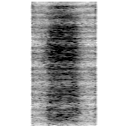

[25 of 25 positions shown; findings below may reference images not displayed]

FINDINGS: NECK

Asymmetric activity in the right submandibular gland noted, is,
maximum standard uptake value 3.9, compared to 3.0 on the
contralateral side. Right supraclavicular node, 1 cm in short axis,
maximum standard uptake value 12.5. Left supraclavicular node,
cm in short axis, maximum standard uptake value 6.0.

CHEST

A large right upper lobe mass extends into the hilum, and has a
maximum standard uptake value of 18.5. Drowned lung appearance in
the right upper lobe noted with a satellite right upper lobe nodule
with maximum standard uptake value of 8.0. There is a right pleural
effusion and focal hypermetabolic activity along the inferomedial
margin of this effusion with maximum standard uptake value of 6.1,
suspicious for malignant effusion.

Extensive thoracic adenopathy is observed. Anterior mediastinal
lymph node is partially calcified, 1.8 cm in short axis, and with
maximum standard uptake value of 11.5. Extensive right paratracheal
and subcarinal adenopathy, with subcarinal adenopathy having maximum
standard uptake value of 16.9 and right paratracheal adenopathy with
short axis diameter of 2.1 cm and maximum standard uptake value
12.8. An epicardial lymph node adjacent to the right atrium has
short axis diameter of 1.3 cm in maximum standard uptake value of
10.2.

Scattered tiny nodules are present in the left lung and remaining
aerated portion of the right lung. Emphysema.

ABDOMEN/PELVIS

No abnormal hypermetabolic activity within the liver, pancreas,
adrenal glands, or spleen. No hypermetabolic lymph nodes in the
abdomen or pelvis.

SKELETON

No focal hypermetabolic activity to suggest skeletal metastasis.
IMPRESSION: 1. Large right upper lobe mass with extensive mediastinal
adenopathy, satellite lesions, and suspected malignant right pleural
effusion.
2. Scattered tiny nodules in both lungs could be inflammatory or
neoplastic. These merit observation.
3. Emphysema.
4. Asymmetric activity in the right submandibular gland, low grade,
probably due to inflammation.

## 2015-04-26 ENCOUNTER — Ambulatory Visit (HOSPITAL_COMMUNITY): Payer: Medicaid Other

## 2015-04-28 ENCOUNTER — Ambulatory Visit (HOSPITAL_COMMUNITY): Payer: Medicaid Other

## 2015-05-01 ENCOUNTER — Ambulatory Visit (HOSPITAL_COMMUNITY): Payer: Medicaid Other

## 2015-05-03 ENCOUNTER — Ambulatory Visit (HOSPITAL_COMMUNITY): Payer: Medicaid Other

## 2015-05-05 ENCOUNTER — Ambulatory Visit (HOSPITAL_COMMUNITY): Payer: Medicaid Other

## 2015-05-08 ENCOUNTER — Ambulatory Visit (HOSPITAL_COMMUNITY): Payer: Medicaid Other

## 2015-05-10 ENCOUNTER — Ambulatory Visit (HOSPITAL_COMMUNITY): Payer: Medicaid Other

## 2015-05-12 ENCOUNTER — Ambulatory Visit (HOSPITAL_COMMUNITY): Payer: Medicaid Other

## 2015-05-15 ENCOUNTER — Ambulatory Visit (HOSPITAL_COMMUNITY): Payer: Medicaid Other

## 2015-08-07 IMAGING — CR DG CHEST 2V
2 series · 2 of 2 positions shown · non-contrast
Comparison: 05/07/2013 CT

CLINICAL DATA: Fever, small cell lung cancer, receiving
chemotherapy

EXAM:
CHEST  2 VIEW

[w chest lat]
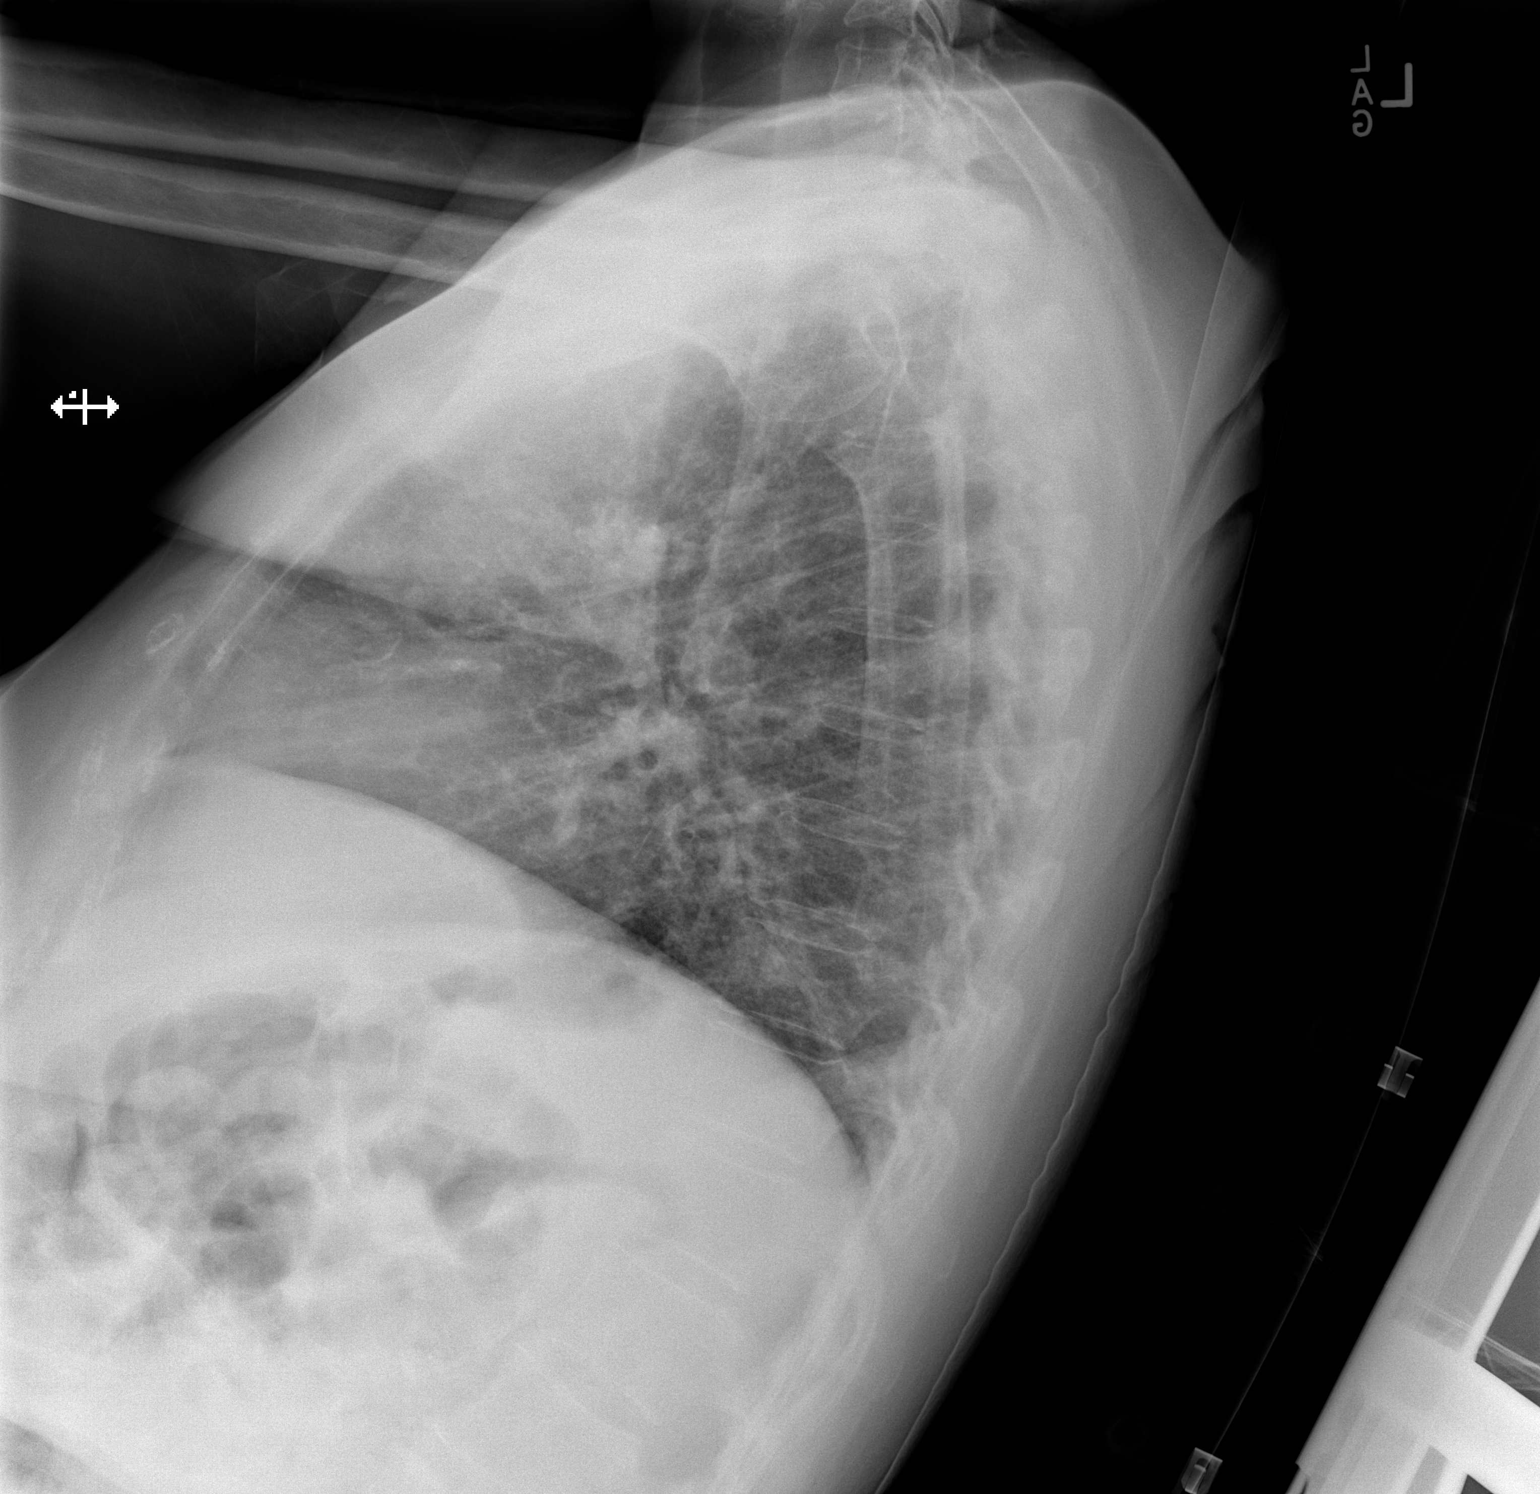

[x chest ap]
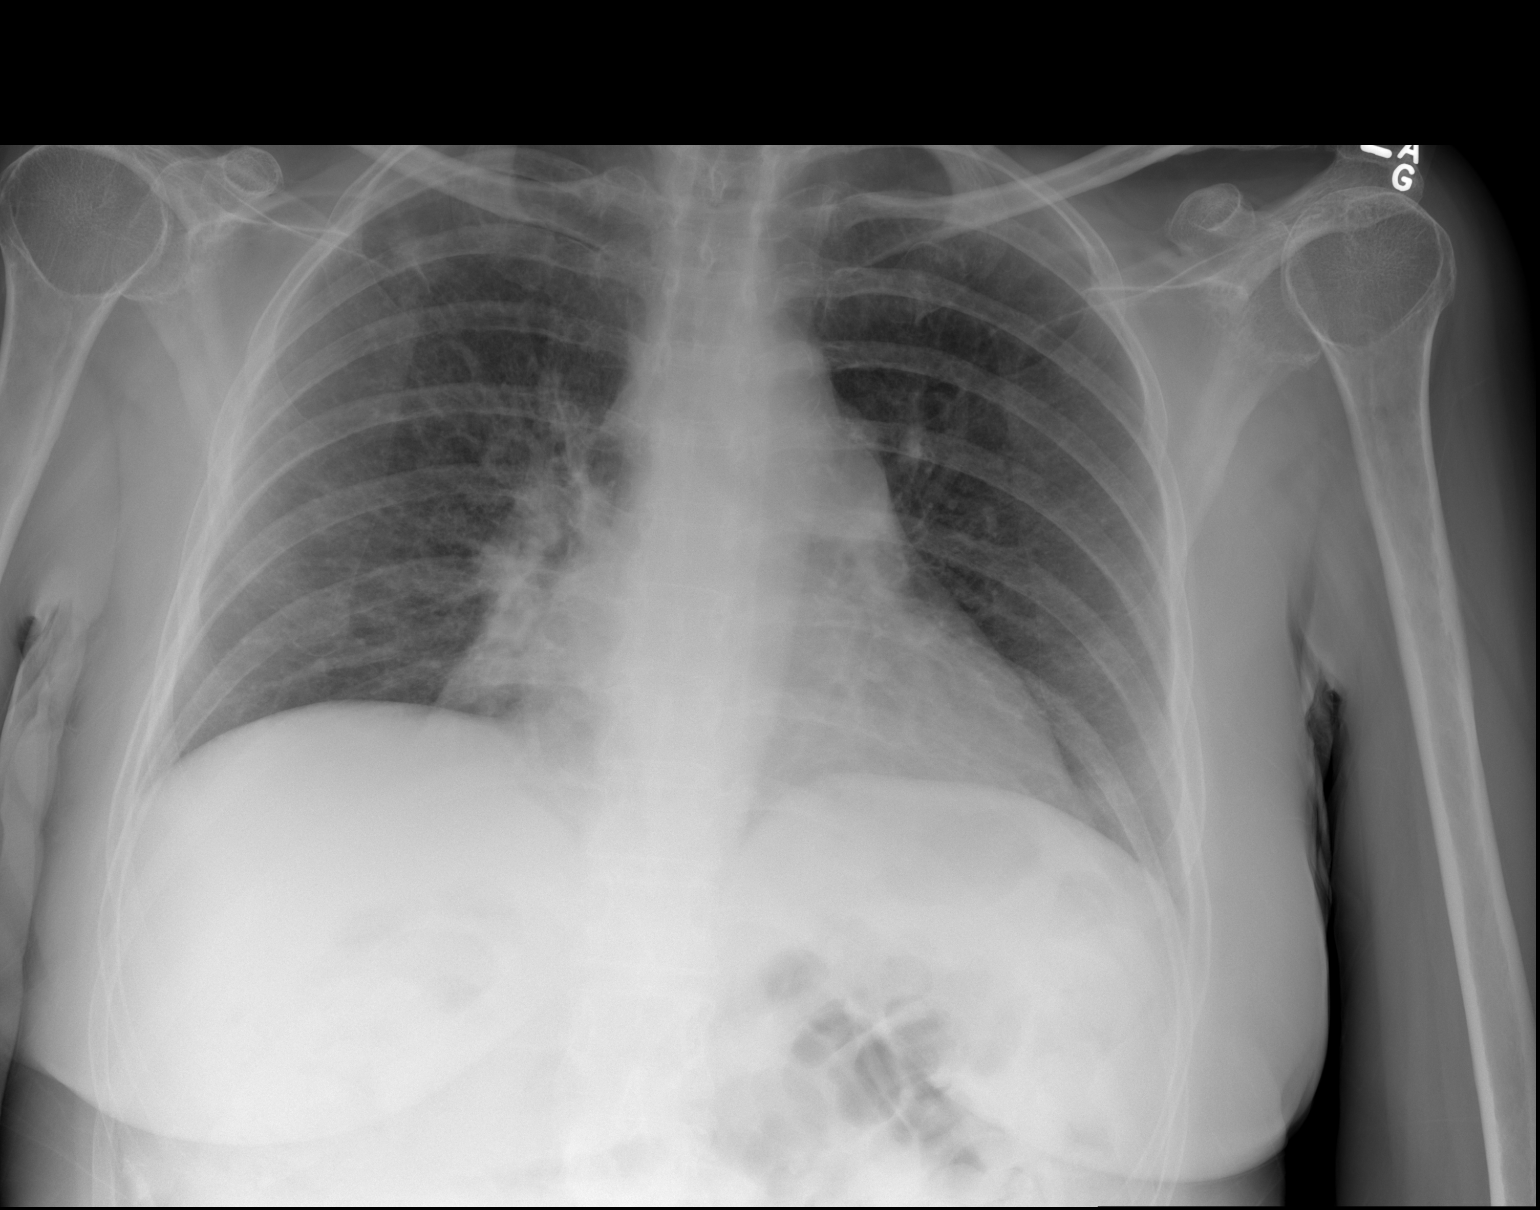

[2 of 2 positions shown; findings below may reference images not displayed]

FINDINGS: Stable spiculated nodule in the peripheral right upper lobe.
Background emphysema noted. Chronic right middle lobe collapse along
the right cardiac border. No edema or definite pneumonia. No
effusion or pneumothorax. Trachea midline. Normal heart size.
IMPRESSION: Stable emphysema and chronic right middle lobe collapse.

Persistent 15 mm right upper lobe spiculated nodule

No superimposed new or acute process
